# Patient Record
Sex: Female | Born: 1965 | Race: White | Hispanic: No | Smoking: Never smoker
Health system: Southern US, Community
[De-identification: ages and names within clinical notes are randomized; demographics above are authoritative.]

## PROBLEM LIST (undated history)

## (undated) DIAGNOSIS — R519 Headache, unspecified: Secondary | ICD-10-CM

## (undated) DIAGNOSIS — E559 Vitamin D deficiency, unspecified: Secondary | ICD-10-CM

## (undated) DIAGNOSIS — T7840XA Allergy, unspecified, initial encounter: Secondary | ICD-10-CM

## (undated) DIAGNOSIS — E7800 Pure hypercholesterolemia, unspecified: Secondary | ICD-10-CM

## (undated) DIAGNOSIS — F329 Major depressive disorder, single episode, unspecified: Secondary | ICD-10-CM

## (undated) DIAGNOSIS — F32A Depression, unspecified: Secondary | ICD-10-CM

## (undated) DIAGNOSIS — I10 Essential (primary) hypertension: Secondary | ICD-10-CM

## (undated) DIAGNOSIS — F1020 Alcohol dependence, uncomplicated: Secondary | ICD-10-CM

## (undated) DIAGNOSIS — R51 Headache: Secondary | ICD-10-CM

## (undated) HISTORY — DX: Allergy, unspecified, initial encounter: T78.40XA

## (undated) HISTORY — PX: WISDOM TOOTH EXTRACTION: SHX21

## (undated) HISTORY — DX: Depression, unspecified: F32.A

## (undated) HISTORY — DX: Vitamin D deficiency, unspecified: E55.9

## (undated) HISTORY — DX: Alcohol dependence, uncomplicated: F10.20

## (undated) HISTORY — DX: Headache: R51

## (undated) HISTORY — DX: Pure hypercholesterolemia, unspecified: E78.00

## (undated) HISTORY — DX: Headache, unspecified: R51.9

## (undated) HISTORY — DX: Major depressive disorder, single episode, unspecified: F32.9

## (undated) HISTORY — DX: Essential (primary) hypertension: I10

---

## 1998-03-19 ENCOUNTER — Observation Stay (HOSPITAL_COMMUNITY): Admission: AD | Admit: 1998-03-19 | Discharge: 1998-03-20 | Payer: Self-pay | Admitting: Obstetrics and Gynecology

## 1998-03-21 ENCOUNTER — Inpatient Hospital Stay (HOSPITAL_COMMUNITY): Admission: AD | Admit: 1998-03-21 | Discharge: 1998-03-21 | Payer: Self-pay | Admitting: Obstetrics and Gynecology

## 1998-03-21 ENCOUNTER — Inpatient Hospital Stay (HOSPITAL_COMMUNITY): Admission: AD | Admit: 1998-03-21 | Discharge: 1998-03-25 | Payer: Self-pay | Admitting: Gynecology

## 1999-07-25 ENCOUNTER — Other Ambulatory Visit: Admission: RE | Admit: 1999-07-25 | Discharge: 1999-07-25 | Payer: Self-pay | Admitting: Gynecology

## 2000-08-13 ENCOUNTER — Other Ambulatory Visit: Admission: RE | Admit: 2000-08-13 | Discharge: 2000-08-13 | Payer: Self-pay | Admitting: Gynecology

## 2000-12-18 ENCOUNTER — Other Ambulatory Visit: Admission: RE | Admit: 2000-12-18 | Discharge: 2000-12-18 | Payer: Self-pay | Admitting: Gynecology

## 2001-01-08 ENCOUNTER — Observation Stay (HOSPITAL_COMMUNITY): Admission: AD | Admit: 2001-01-08 | Discharge: 2001-01-08 | Payer: Self-pay | Admitting: Gynecology

## 2001-07-11 ENCOUNTER — Inpatient Hospital Stay (HOSPITAL_COMMUNITY): Admission: AD | Admit: 2001-07-11 | Discharge: 2001-07-14 | Payer: Self-pay | Admitting: Gynecology

## 2001-08-26 ENCOUNTER — Other Ambulatory Visit: Admission: RE | Admit: 2001-08-26 | Discharge: 2001-08-26 | Payer: Self-pay | Admitting: Gynecology

## 2001-10-07 ENCOUNTER — Ambulatory Visit (HOSPITAL_COMMUNITY): Admission: RE | Admit: 2001-10-07 | Discharge: 2001-10-07 | Payer: Self-pay | Admitting: Gynecology

## 2001-10-07 ENCOUNTER — Encounter (INDEPENDENT_AMBULATORY_CARE_PROVIDER_SITE_OTHER): Payer: Self-pay | Admitting: *Deleted

## 2002-12-29 ENCOUNTER — Other Ambulatory Visit: Admission: RE | Admit: 2002-12-29 | Discharge: 2002-12-29 | Payer: Self-pay | Admitting: Gynecology

## 2004-08-04 ENCOUNTER — Other Ambulatory Visit: Admission: RE | Admit: 2004-08-04 | Discharge: 2004-08-04 | Payer: Self-pay | Admitting: Gynecology

## 2007-09-03 ENCOUNTER — Ambulatory Visit: Payer: Self-pay

## 2010-09-15 NOTE — Op Note (Signed)
[facility]  PatientPAYZLEE, Melinda Visit Number: [id number] MRN: [id number]          Service Type: OBS Location: 9300 [id number] Attending Physician:  Merrily Pew Dictated by:   Nadyne Coombes Fontaine, M.D. Proc. Date: 07/11/01 Admit Date:  07/11/2001                             Operative Report  PREOPERATIVE DIAGNOSES:       1. Pregnancy at term.                               2. History of prior cesarean section, desires                                  repeat cesarean section.  POSTOPERATIVE DIAGNOSES:      1. Pregnancy at term.                               2. History of prior cesarean section, desires                                  repeat cesarean section.  PROCEDURE:                    Repeat low transverse cervical cesarean section.  SURGEON:                      Timothy P. Fontaine, M.D.  ASSISTANT:                    Katy Fitch, M.D.  ANESTHESIA:                   Spinal.  ESTIMATED BLOOD LOSS:         Les than 500 cc.  COMPLICATIONS:                None.  SPECIMENS:                    Samples of cord blood.  FINDINGS:                     At 7:52 a.m., normal female.  Weight 8 lb 13 oz. Apgars 9 and 9.  Pelvic anatomy noted to be normal.  DESCRIPTION OF PROCEDURE:     The patient was taken to the operating room and underwent spinal anesthesia.  She was placed in the left tile supine position and received and abdominal preparation with Betadine scrub and Betadine solution.  Foley catheter was placed with sterile technique.  The patient was draped in the usual fashion.  After assuring adequate anesthesia, the abdomen was sharply entered through a repeat Pfannenstiel incision, achieving adequate hemostasis at all levels.  A bladder flap was sharply and bluntly developed without difficulty.  The uterus was sharply entered in the lower uterine segment and bluntly extended laterally.  The bulging membranes were  ruptured. The fluid was noted to be clear.  The infants head was delivered through the incision.  The nares and mouth were suctioned.  The rest of the infant was delivered.  The cord was doubly clamped and cut.  The infant was handed to pediatrics in attendance.  Samples of cord blood were obtained.  The placenta was then spontaneously extruded and noted to be intact.  The uterus was exteriorized.  The endometrial cavity was explored with a sponge to remove all placental membrane fragments.  The patient received 1 g of Cefotan IV prophylaxis.  The uterine incision was then closed in one layer using 0 Vicryl suture in a running interlocking stitch.  A single interrupted figure-of-eight suture was then placed at the incision line to achieve ultimate hemostasis. The uterus was returned to the abdomen, which was copiously irrigated. Adequate hemostasis was visualized.  The anterior fascia was then reapproximated using 0 Vicryl suture in a running stitch starting at the angle and meeting in the middle.  The subcutaneous tissues were irrigated.  Adequate hemostasis was visualized.  The skin was reapproximated with staples.  A sterile dressing was applied.  The patient was taken to the recovery room in good condition, having tolerated the procedure well. Dictated by:   Nadyne Coombes. Fontaine, M.D. Attending Physician:  Merrily Pew DD:  07/11/01 TD:  07/12/01 Job: [id number]

## 2010-09-15 NOTE — H&P (Signed)
[facility]  PatientCARMESHA, [name] Visit Number: [id number] MRN: [id number]          Service Type: OBS Location: 910B 9198 01 Attending Physician:  Merrily Pew Dictated by:   Nadyne Coombes Fontaine, M.D. Admit Date:  07/11/2001                           History and Physical  CHIEF COMPLAINT:              Pregnancy at term.  History of prior cesarean section; desires repeat cesarean section.  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 Hyperthyroid, on no medications.  HISTORY OF PRESENT ILLNESS:   A 45 year old G2 P1 female at [redacted] weeks gestation with history of prior cesarean section due to failure to progress, for repeat cesarean section after counseling for trial of labor.  Patients prenatal course was complicated by history of hyperthyroidism for which she has been followed with serial thyroid functions, although she is on no medications due to being clinically euthyroid.  PAST MEDICAL HISTORY:         Low TSH, normal thyroid functions.  PAST SURGICAL HISTORY:        Prior cesarean section in 1999 for failure to progress.  ALLERGIES:                    None.  REVIEW OF SYSTEMS:            Noncontributory.  SOCIAL HISTORY:               Noncontributory.  ADMISSION PHYSICAL EXAMINATION:  HEENT:                        Normal.  LUNGS:                        Clear.  CARDIAC:                      Regular rate.  No rubs, murmurs, gallops.  ABDOMEN:                      Gravid, consistent with term.  Positive fetal heart tones.  PELVIC:                       Deferred.  ASSESSMENT AND PLAN:          A 45 year old G2 P37 female, term gestation, history of prior cesarean section, who desires repeat cesarean section after counseling for trial of labor.  Risks, benefits, indications, and alternatives for the procedure were reviewed to include the risks of infection, prolonged antibiotics, wound complications, opening and draining of incisions,  closure by secondary intention, excessive bleeding requiring transfusion and the risk of transfusion were discussed.  Risks of inadvertent injury to internal organs including bowel, bladder, ureters, vessels and nerves necessitating major exploratory reparative surgeries and future reparative surgeries including ostomy formation were discussed.  The risks of fetal injury including scalpel as well as birthing, musculoskeletal, neural were all discussed, understood, and accepted.  The patients questions are answered to her satisfaction and she is ready to proceed with surgery. Dictated by:   Nadyne Coombes. Fontaine, M.D. Attending Physician:  Merrily Pew DD:  07/10/01 TD:  07/11/01 Job: [id number]

## 2010-09-15 NOTE — Op Note (Signed)
[facility]  PatientAMI, [name] Visit Number: [id number] MRN: [id number]          Service Type: DSU Location: [facility] Attending Physician:  Merrily Pew Dictated by:   Nadyne Coombes Fontaine, M.D. Proc. Date: 10/07/01 Admit Date:  10/07/2001 Discharge Date: 10/07/2001                             Operative Report  PREOPERATIVE DIAGNOSIS:       Wound separation with subsequent refractile granulation tissue.  POSTOPERATIVE DIAGNOSIS:      Wound separation with subsequent refractile granulation tissue.  OPERATION:                    Excision granulation tissue.  SURGEON:                      Timothy P. Fontaine, M.D.  ANESTHESIA:                   Marcaine local injection, IV sedation.  COMPLICATIONS:                None.  ESTIMATED BLOOD LOSS:         Minimal.  SPECIMEN:                     Granulation tissue.  DESCRIPTION OF PROCEDURE:     The patient was taken to the operating room, placed in supine position, underwent IV sedation, received abdominal preparation with Betadine solution.  The patient was draped in the usual fashion.  The skin surrounding the area of granulation tissue was injected subcutaneously with 0.25% Marcaine, and subsequently the area of granulation tissue was excised in an elliptical type incision to the level of the subcutaneous tissues.  Adequate hemostasis was achieved with electrocautery. The tissues were irrigated, and subsequently the skin was reapproximated using 4-0 Vicryl in a running subcuticular stitch.  Steri-Strips were applied to reinforce the incision, a sterile dressing applied.  The patient was taken to the recovery room in good condition having tolerated the procedure well. Dictated by:   Nadyne Coombes. Fontaine, M.D. Attending Physician:  Merrily Pew DD:  10/07/01 TD:  10/09/01 Job: [id number]

## 2010-09-15 NOTE — H&P (Signed)
[facility]  PatientSHERRINE, [name] Visit Number: [id number] MRN: [id number]          Service Type: DSU Location: [facility] Attending Physician:  Merrily Pew Dictated by:   Nadyne Coombes Fontaine, M.D. Admit Date:  10/07/2001                           History and Physical  CHIEF COMPLAINT:              Granulation tissue.  HISTORY OF PRESENT ILLNESS:   A 45 year old status post repeat cesarean section on July 11, 2001.  Postoperative course had been complicated by a superficial incision opening which has granulated in, although has maintained a shelf of granulation tissue which has been recalcitrant to silver nitrate application and has persisted.  Options of continued silver nitrate applications versus excising this small area with reapproximation of the skin were discussed and the patient desires having this area excised.  The patient is admitted at this time for excision under local anesthesia with IV sedation.  PAST MEDICAL HISTORY:         Low TSH with normal thyroid functions.  PAST SURGICAL HISTORY:        Cesarean section x2.  ALLERGIES:                    None.  REVIEW OF SYSTEMS:            Noncontributory.  SOCIAL HISTORY:               Noncontributory.  FAMILY HISTORY:               Noncontributory.  ADMISSION EXAMINATION:  VITAL SIGNS:                  Afebrile, vital signs are stable.  HEENT:                        Normal.  LUNGS:                        Clear.  CARDIAC:                      Regular rate without rubs, murmurs, or gallops.  ABDOMEN:                      Shows an area of approximately 2 cm at the right aspect of her incision with an area of persistent granulation tissue.  No evidence of infection, hernia, or other abnormalities.  ASSESSMENT:                   A 45 year old G2 P2 female status post cesarean section, subsequent incision opening with healing by secondary intention, now with persistent  area of granulation tissue recalcitrant to serial silver nitrate applications.  The patient is admitted for excision and reapproximation of the skin under local anesthesia.  The procedure was discussed with the patient and the risks were reviewed with her to include the primary risks of skin infection; as well as having to reopen this new incision due to either a complication such as hematoma, seroma, infection, abscess formation; or that this could secondarily open on its own as what happened with her prior incision, and that she would have to have this closed by secondary intention as did  occur with the first closure.  Do not anticipate intra-abdominal entry.  I also reviewed the risks of bleeding with her although, given the superficial nature of this, I mentioned the risks of transfusion although realistically this would be extremely low.  The patients questions were answered to her satisfaction and she is ready to proceed with the surgery. Dictated by:   Nadyne Coombes. Fontaine, M.D. Attending Physician:  Merrily Pew DD:  10/06/01 TD:  10/07/01 Job: [id number]

## 2010-09-15 NOTE — Discharge Summary (Signed)
[facility]  PatientPERMELIA, BAMBA Visit Number: [id number] MRN: [id number]          Service Type: OBS Location: 9300 [id number] Attending Physician:  Merrily Pew Dictated by:   Antony Contras, [facility] Admit Date:  07/11/2001 Discharge Date: 07/14/2001                             Discharge Summary  DISCHARGE DIAGNOSES:          1. Pregnancy at term.                               2. History of previous cesarean section, desires                                  repeat.  PROCEDURE:                    Repeat low cervical transverse cesarean section.  HISTORY OF PRESENT ILLNESS:   The patient is a 45 year old, Gravida 2, Para 1-0-0-1 with LMP of October 04, 2000, [facility] July 17, 2001.  Prenatal risk factors include history of previous cesarean section, hyperthyroidism, also this is a Clomid pregnancy.  PRENATAL LABS:              Blood type A positive.  Antibody screen negative. RPR, HBIG, and HIV nonreactive. Rubella immune.  HOSPITAL COURSE AND TREATMENT:                    The patient was admitted on July 11, 2001. Repeat low transverse cervical cesarean section was performed by Dr. Audie Box assisted by Dr. Penni Homans under spinal anesthesia.  Findings included viable female infant, weight 8 pounds 13 ounces, Apgars 9 and 9, normal pelvic anatomy. The patient remained afebrile.  She had no difficulty voiding.  She was able to be discharged in satisfactory condition on her third postoperative day.  LABORATORY DATA:              Hemoglobin 10.  DISPOSITION:                  Follow up in six weeks.  Continue prenatal vitamins and iron.  Motrin and Tylox for pain. Dictated by:   Antony Contras, [facility] Attending Physician:  Merrily Pew DD:  07/30/01 TD:  07/31/01 Job: [id number]

## 2013-01-13 ENCOUNTER — Telehealth: Payer: Self-pay | Admitting: Internal Medicine

## 2013-01-13 NOTE — Telephone Encounter (Signed)
Pt has to cancel upcoming appt for sept 19, unable to find new patient slot on your sch before 12/14. Pt needs cpx before 02/27/13.Will you be willing to work her in?

## 2013-01-14 ENCOUNTER — Ambulatory Visit: Payer: Self-pay | Admitting: Internal Medicine

## 2013-01-14 NOTE — Telephone Encounter (Signed)
Yes put her on a day that I only have 2 other new pts please

## 2013-01-15 NOTE — Telephone Encounter (Signed)
Left message for pt to call back and schedule

## 2013-01-16 ENCOUNTER — Ambulatory Visit: Payer: Self-pay | Admitting: Internal Medicine

## 2013-02-06 ENCOUNTER — Encounter: Payer: Self-pay | Admitting: Internal Medicine

## 2013-02-06 ENCOUNTER — Ambulatory Visit (INDEPENDENT_AMBULATORY_CARE_PROVIDER_SITE_OTHER): Payer: BC Managed Care – PPO | Admitting: Internal Medicine

## 2013-02-06 ENCOUNTER — Other Ambulatory Visit (INDEPENDENT_AMBULATORY_CARE_PROVIDER_SITE_OTHER): Payer: BC Managed Care – PPO

## 2013-02-06 VITALS — BP 142/90 | HR 95 | Temp 97.4°F | Ht 62.0 in | Wt 180.0 lb

## 2013-02-06 DIAGNOSIS — Z23 Encounter for immunization: Secondary | ICD-10-CM

## 2013-02-06 DIAGNOSIS — Z0000 Encounter for general adult medical examination without abnormal findings: Secondary | ICD-10-CM

## 2013-02-06 DIAGNOSIS — Z1329 Encounter for screening for other suspected endocrine disorder: Secondary | ICD-10-CM

## 2013-02-06 DIAGNOSIS — Z1239 Encounter for other screening for malignant neoplasm of breast: Secondary | ICD-10-CM

## 2013-02-06 DIAGNOSIS — Z13220 Encounter for screening for lipoid disorders: Secondary | ICD-10-CM

## 2013-02-06 DIAGNOSIS — Z131 Encounter for screening for diabetes mellitus: Secondary | ICD-10-CM

## 2013-02-06 DIAGNOSIS — Z130 Encounter for screening for diseases of the blood and blood-forming organs and certain disorders involving the immune mechanism: Secondary | ICD-10-CM

## 2013-02-06 DIAGNOSIS — Z733 Stress, not elsewhere classified: Secondary | ICD-10-CM

## 2013-02-06 DIAGNOSIS — F411 Generalized anxiety disorder: Secondary | ICD-10-CM

## 2013-02-06 DIAGNOSIS — F439 Reaction to severe stress, unspecified: Secondary | ICD-10-CM

## 2013-02-06 LAB — CBC
Hemoglobin: 14.6 g/dL (ref 12.0–15.0)
MCHC: 34.1 g/dL (ref 30.0–36.0)
MCV: 97.7 fl (ref 78.0–100.0)
Platelets: 315 10*3/uL (ref 150.0–400.0)
RDW: 12.6 % (ref 11.5–14.6)

## 2013-02-06 LAB — COMPREHENSIVE METABOLIC PANEL
AST: 54 U/L — ABNORMAL HIGH (ref 0–37)
Albumin: 4.3 g/dL (ref 3.5–5.2)
BUN: 12 mg/dL (ref 6–23)
CO2: 27 mEq/L (ref 19–32)
Calcium: 9.5 mg/dL (ref 8.4–10.5)
Chloride: 100 mEq/L (ref 96–112)
Creatinine, Ser: 0.8 mg/dL (ref 0.4–1.2)
GFR: 87.38 mL/min (ref >60.00)
Glucose, Bld: 120 mg/dL — ABNORMAL HIGH (ref 70–99)
Potassium: 5 mEq/L (ref 3.5–5.1)

## 2013-02-06 LAB — TSH: TSH: 1.38 u[IU]/mL (ref 0.35–5.50)

## 2013-02-06 LAB — LIPID PANEL
HDL: 74.7 mg/dL (ref >39.00)
Triglycerides: 73 mg/dL (ref 0.0–149.0)
VLDL: 14.6 mg/dL (ref 0.0–40.0)

## 2013-02-06 MED ORDER — FLUOXETINE HCL 20 MG PO TABS: 20.0000 mg | ORAL_TABLET | Freq: Every day | ORAL | Status: DC

## 2013-02-06 MED ORDER — ALPRAZOLAM 0.5 MG PO TABS: 0.5000 mg | ORAL_TABLET | Freq: Two times a day (BID) | ORAL | Status: DC | PRN

## 2013-02-06 NOTE — Assessment & Plan Note (Signed)
Will start prozac/xanax

## 2013-02-06 NOTE — Addendum Note (Signed)
Addended by: Bethann Punches E on: 02/06/2013 11:10 AM   Modules accepted: Orders

## 2013-02-06 NOTE — Progress Notes (Signed)
HPI  Pt presents to the clinic today to establish care. She does not have a PCP. She does have some concerns today about anxiety and stress. This is starting to affect her everyday life. She used to be on Lexapro at one point but did not feel it was effective so stopped taking it. She would like to get back on something today.  Flu: never Tetanus: not within the last 10 years LMP: 01/03/2013 Pap Smear: More than 2 years ago Mammogram: More than 2 year ago Eye Doctor: as needed Dentist: yearly   Past Medical History  Diagnosis Date  . Depression   . Allergy     No current outpatient prescriptions on file.   No current facility-administered medications for this visit.    Not on File  Family History  Problem Relation Age of Onset  . Cancer Mother 75    breast  . Diabetes Maternal Grandmother   . Heart disease Maternal Grandmother   . Stroke Maternal Grandmother   . Drug abuse Paternal Grandfather   . Early death Neg Hx     History   Social History  . Marital Status: Married    Spouse Name: N/A    Number of Children: N/A  . Years of Education: N/A   Occupational History  . Not on file.   Social History Main Topics  . Smoking status: Never Smoker   . Smokeless tobacco: Not on file  . Alcohol Use: 0.6 oz/week    1 Glasses of wine per week  . Drug Use: No  . Sexual Activity: Yes    Birth Control/ Protection: Surgical   Other Topics Concern  . Not on file   Social History Narrative  . No narrative on file    ROS:  Constitutional: Denies fever, malaise, fatigue, headache or abrupt weight changes.  HEENT: Denies eye pain, eye redness, ear pain, ringing in the ears, wax buildup, runny nose, nasal congestion, bloody nose, or sore throat. Respiratory: Denies difficulty breathing, shortness of breath, cough or sputum production.   Cardiovascular: Denies chest pain, chest tightness, palpitations or swelling in the hands or feet.  Gastrointestinal: Denies abdominal  pain, bloating, constipation, diarrhea or blood in the stool.  GU: Denies frequency, urgency, pain with urination, blood in urine, odor or discharge. Musculoskeletal: Denies decrease in range of motion, difficulty with gait, muscle pain or joint pain and swelling.  Skin: Denies redness, rashes, lesions or ulcercations.  Neurological: Denies dizziness, difficulty with memory, difficulty with speech or problems with balance and coordination.  Psych: Pt reports anxiety and stress. Denies depression, SI/HI.  No other specific complaints in a complete review of systems (except as listed in HPI above).  PE:  BP 142/90  Pulse 95  Temp(Src) 97.4 F (36.3 C) (Oral)  Ht 5\' 2"  (1.575 m)  Wt 180 lb (81.647 kg)  BMI 32.91 kg/m2  SpO2 96% Wt Readings from Last 3 Encounters:  02/06/13 180 lb (81.647 kg)    General: Appears her stated age, obese but well developed, well nourished in NAD. HEENT: Head: normal shape and size; Eyes: sclera white, no icterus, conjunctiva pink, PERRLA and EOMs intact; Ears: Tm's gray and intact, normal light reflex; Nose: mucosa pink and moist, septum midline; Throat/Mouth: Teeth present, mucosa pink and moist, no lesions or ulcerations noted.  Neck: Normal range of motion. Neck supple, trachea midline. No massses, lumps or thyromegaly present.  Cardiovascular: Normal rate and rhythm. S1,S2 noted.  No murmur, rubs or gallops noted. No  JVD or BLE edema. No carotid bruits noted. Pulmonary/Chest: Normal effort and positive vesicular breath sounds. No respiratory distress. No wheezes, rales or ronchi noted.  Abdomen: Soft and nontender. Normal bowel sounds, no bruits noted. No distention or masses noted. Liver, spleen and kidneys non palpable. Musculoskeletal: Normal range of motion. No signs of joint swelling. No difficulty with gait.  Neurological: Alert and oriented. Cranial nerves II-XII intact. Coordination normal. +DTRs bilaterally. Psychiatric: Mood and affect normal.  Behavior is normal. Judgment and thought content normal.      Assessment and Plan:  Preventative Health Maintenance:  Tdap given today Pt will call back to schedule pap/flu shot Encouraged pt to visit eye doctor at least every 2 years Will obtain screening labs today Will set you up for your mammogram  RTC in 1 year or sooner if needed

## 2013-02-06 NOTE — Patient Instructions (Signed)

## 2013-02-20 ENCOUNTER — Ambulatory Visit: Payer: Self-pay | Admitting: Internal Medicine

## 2013-06-19 ENCOUNTER — Other Ambulatory Visit: Payer: Self-pay | Admitting: Internal Medicine

## 2013-06-19 NOTE — Telephone Encounter (Signed)
Last prescribed 01/2013 with 3 refills at [facility]--please advise

## 2013-09-28 ENCOUNTER — Other Ambulatory Visit: Payer: Self-pay | Admitting: Internal Medicine

## 2013-09-29 ENCOUNTER — Encounter: Payer: Self-pay | Admitting: Internal Medicine

## 2013-09-29 ENCOUNTER — Other Ambulatory Visit: Payer: Self-pay

## 2013-09-29 ENCOUNTER — Ambulatory Visit (INDEPENDENT_AMBULATORY_CARE_PROVIDER_SITE_OTHER): Payer: BLUE CROSS/BLUE SHIELD | Admitting: Internal Medicine

## 2013-09-29 VITALS — BP 138/92 | HR 84 | Temp 97.9°F | Wt 177.0 lb

## 2013-09-29 DIAGNOSIS — F3289 Other specified depressive episodes: Secondary | ICD-10-CM | POA: Diagnosis not present

## 2013-09-29 DIAGNOSIS — F411 Generalized anxiety disorder: Secondary | ICD-10-CM

## 2013-09-29 DIAGNOSIS — F439 Reaction to severe stress, unspecified: Secondary | ICD-10-CM

## 2013-09-29 DIAGNOSIS — F32A Depression, unspecified: Secondary | ICD-10-CM

## 2013-09-29 DIAGNOSIS — F329 Major depressive disorder, single episode, unspecified: Secondary | ICD-10-CM | POA: Insufficient documentation

## 2013-09-29 DIAGNOSIS — Z733 Stress, not elsewhere classified: Secondary | ICD-10-CM | POA: Diagnosis not present

## 2013-09-29 MED ORDER — ALPRAZOLAM 0.5 MG PO TABS: 0.5000 mg | ORAL_TABLET | Freq: Two times a day (BID) | ORAL | Status: DC | PRN

## 2013-09-29 MED ORDER — FLUOXETINE HCL 40 MG PO CAPS: 40.0000 mg | ORAL_CAPSULE | Freq: Every day | ORAL | Status: DC

## 2013-09-29 NOTE — Progress Notes (Signed)
Subjective:    Patient ID: [name], female    DOB: [birth year], 48 y.o.   MRN: [id number]  HPI  Pt presents to the clinic today with c/o a medication problem. She is on prozac for anxiety and depression. She does not feel the prozac is working as effectively as it has in the past. She reports worsening depression symptoms. She reports she is sleeping a lot, more irritable and reports that she has been drinking more alcohol recently. She does report stress in her marital relationship. She has not tried marital counseling.  Review of Systems      Past Medical History  Diagnosis Date  . Depression   . Allergy     Current Outpatient Prescriptions  Medication Sig Dispense Refill  . ALPRAZolam (XANAX) 0.5 MG tablet Take 1 tablet (0.5 mg total) by mouth 2 (two) times daily as needed for sleep.  30 tablet  2  . FLUoxetine (PROZAC) 20 MG tablet TAKE 1 TABLET (20 MG TOTAL) BY MOUTH DAILY.  30 tablet  3   No current facility-administered medications for this visit.    No Known Allergies  Family History  Problem Relation Age of Onset  . Cancer Mother 4055    breast  . Diabetes Maternal Grandmother   . Heart disease Maternal Grandmother   . Stroke Maternal Grandmother   . Drug abuse Paternal Grandfather   . Early death Neg Hx     History   Social History  . Marital Status: Married    Spouse Name: N/A    Number of Children: N/A  . Years of Education: N/A   Occupational History  . Not on file.   Social History Main Topics  . Smoking status: Never Smoker   . Smokeless tobacco: Not on file  . Alcohol Use: 0.6 oz/week    1 Glasses of wine per week     Comment: moderate  . Drug Use: No  . Sexual Activity: Yes    Birth Control/ Protection: Surgical   Other Topics Concern  . Not on file   Social History Narrative  . No narrative on file     Constitutional: Denies fever, malaise, fatigue, headache or abrupt weight changes.  Psych: Pt reports anxiety and depression.  Denies SI/HI.  No other specific complaints in a complete review of systems (except as listed in HPI above).  Objective:   Physical Exam   BP 138/92  Pulse 84  Temp(Src) 97.9 F (36.6 C) (Oral)  Wt 177 lb (80.287 kg)  SpO2 99% Wt Readings from Last 3 Encounters:  09/29/13 177 lb (80.287 kg)  02/06/13 180 lb (81.647 kg)    General: Appears her stated age, well developed, well nourished in NAD. Cardiovascular: Normal rate and rhythm. S1,S2 noted.  No murmur, rubs or gallops noted. No JVD or BLE edema. No carotid bruits noted. Pulmonary/Chest: Normal effort and positive vesicular breath sounds. No respiratory distress. No wheezes, rales or ronchi noted.  Psychiatric: Mood terful and affect normal. Behavior is normal. Judgment and thought content normal.     BMET    Component Value Date/Time   NA 137 02/06/2013 1105   K 5.0 02/06/2013 1105   CL 100 02/06/2013 1105   CO2 27 02/06/2013 1105   GLUCOSE 120* 02/06/2013 1105   BUN 12 02/06/2013 1105   CREATININE 0.8 02/06/2013 1105   CALCIUM 9.5 02/06/2013 1105    Lipid Panel     Component Value Date/Time   CHOL 258* 02/06/2013  1105   TRIG 73.0 02/06/2013 1105   HDL 74.70 02/06/2013 1105   CHOLHDL 3 02/06/2013 1105   VLDL 14.6 02/06/2013 1105    CBC    Component Value Date/Time   WBC 9.9 02/06/2013 1105   RBC 4.37 02/06/2013 1105   HGB 14.6 02/06/2013 1105   HCT 42.8 02/06/2013 1105   PLT 315.0 02/06/2013 1105   MCV 97.7 02/06/2013 1105   MCHC 34.1 02/06/2013 1105   RDW 12.6 02/06/2013 1105    Hgb A1C Lab Results  Component Value Date   HGBA1C 5.6 02/06/2013        Assessment & Plan:

## 2013-09-29 NOTE — Progress Notes (Signed)
Pre visit review using our clinic review tool, if applicable. No additional management support is needed unless otherwise documented below in the visit note. 

## 2013-09-29 NOTE — Assessment & Plan Note (Signed)
Rare xanax use.  

## 2013-09-29 NOTE — Patient Instructions (Addendum)

## 2013-09-29 NOTE — Assessment & Plan Note (Signed)
Worse due to marital problems Support offered today Encouraged marital counseling Will increase prozac to 40 mg daily Advised her to cut back on her alcohol use

## 2013-10-29 ENCOUNTER — Other Ambulatory Visit: Payer: Self-pay | Admitting: Internal Medicine

## 2013-10-29 NOTE — Telephone Encounter (Signed)
This medication was last filled 09/29/13 #30 BID PRN--[facility]please advise

## 2013-10-29 NOTE — Telephone Encounter (Signed)
Medication phoned to pharmacy.  

## 2013-10-29 NOTE — Telephone Encounter (Signed)
Please call in.  Thanks.   

## 2013-11-02 ENCOUNTER — Emergency Department (HOSPITAL_COMMUNITY)
Admission: EM | Admit: 2013-11-02 | Discharge: 2013-11-02 | Disposition: A | Discharge status: Home / Self Care | Payer: BLUE CROSS/BLUE SHIELD | Attending: Emergency Medicine | Admitting: Emergency Medicine

## 2013-11-02 ENCOUNTER — Encounter (HOSPITAL_COMMUNITY): Payer: Self-pay | Admitting: Emergency Medicine

## 2013-11-02 DIAGNOSIS — Z3202 Encounter for pregnancy test, result negative: Secondary | ICD-10-CM | POA: Diagnosis not present

## 2013-11-02 DIAGNOSIS — F1010 Alcohol abuse, uncomplicated: Secondary | ICD-10-CM | POA: Diagnosis not present

## 2013-11-02 DIAGNOSIS — H5500 Unspecified nystagmus: Secondary | ICD-10-CM | POA: Insufficient documentation

## 2013-11-02 DIAGNOSIS — F3289 Other specified depressive episodes: Secondary | ICD-10-CM | POA: Insufficient documentation

## 2013-11-02 DIAGNOSIS — Z79899 Other long term (current) drug therapy: Secondary | ICD-10-CM | POA: Diagnosis not present

## 2013-11-02 DIAGNOSIS — R404 Transient alteration of awareness: Secondary | ICD-10-CM | POA: Insufficient documentation

## 2013-11-02 DIAGNOSIS — F329 Major depressive disorder, single episode, unspecified: Secondary | ICD-10-CM | POA: Insufficient documentation

## 2013-11-02 DIAGNOSIS — R4182 Altered mental status, unspecified: Secondary | ICD-10-CM | POA: Diagnosis present

## 2013-11-02 DIAGNOSIS — F10921 Alcohol use, unspecified with intoxication delirium: Secondary | ICD-10-CM

## 2013-11-02 DIAGNOSIS — F411 Generalized anxiety disorder: Secondary | ICD-10-CM | POA: Insufficient documentation

## 2013-11-02 LAB — COMPREHENSIVE METABOLIC PANEL
ALT: 35 U/L (ref 0–35)
AST: 61 U/L — ABNORMAL HIGH (ref 0–37)
Albumin: 3.8 g/dL (ref 3.5–5.2)
Alkaline Phosphatase: 89 U/L (ref 39–117)
Anion gap: 15 (ref 5–15)
BUN: 15 mg/dL (ref 6–23)
CALCIUM: 9.5 mg/dL (ref 8.4–10.5)
CO2: 26 meq/L (ref 19–32)
Chloride: 102 mEq/L (ref 96–112)
Creatinine, Ser: 0.71 mg/dL (ref 0.50–1.10)
GLUCOSE: 86 mg/dL (ref 70–99)
Potassium: 4.5 mEq/L (ref 3.7–5.3)
Sodium: 143 mEq/L (ref 137–147)
Total Bilirubin: 0.2 mg/dL — ABNORMAL LOW (ref 0.3–1.2)
Total Protein: 8.4 g/dL — ABNORMAL HIGH (ref 6.0–8.3)

## 2013-11-02 LAB — CBC
HEMATOCRIT: 40.4 % (ref 36.0–46.0)
HEMOGLOBIN: 13.9 g/dL (ref 12.0–15.0)
MCH: 34.4 pg — AB (ref 26.0–34.0)
MCHC: 34.4 g/dL (ref 30.0–36.0)
MCV: 100 fL (ref 78.0–100.0)
Platelets: 313 10*3/uL (ref 150–400)
RBC: 4.04 MIL/uL (ref 3.87–5.11)
RDW: 12.5 % (ref 11.5–15.5)
WBC: 9.3 10*3/uL (ref 4.0–10.5)

## 2013-11-02 LAB — URINALYSIS, ROUTINE W REFLEX MICROSCOPIC
Bilirubin Urine: NEGATIVE
Glucose, UA: NEGATIVE mg/dL
Hgb urine dipstick: NEGATIVE
KETONES UR: NEGATIVE mg/dL
LEUKOCYTES UA: NEGATIVE
NITRITE: NEGATIVE
PH: 5.5 (ref 5.0–8.0)
Protein, ur: NEGATIVE mg/dL
Specific Gravity, Urine: 1.01 (ref 1.005–1.030)
UROBILINOGEN UA: 0.2 mg/dL (ref 0.0–1.0)

## 2013-11-02 LAB — RAPID URINE DRUG SCREEN, HOSP PERFORMED
AMPHETAMINES: NOT DETECTED
Barbiturates: NOT DETECTED
Benzodiazepines: NOT DETECTED
Cocaine: NOT DETECTED
Opiates: NOT DETECTED
Tetrahydrocannabinol: NOT DETECTED

## 2013-11-02 LAB — ETHANOL: Alcohol, Ethyl (B): 242 mg/dL — ABNORMAL HIGH (ref 0–11)

## 2013-11-02 LAB — ACETAMINOPHEN LEVEL: Acetaminophen (Tylenol), Serum: <15 ug/mL (ref 10–30)

## 2013-11-02 LAB — SALICYLATE LEVEL: Salicylate Lvl: <2 mg/dL — ABNORMAL LOW (ref 2.8–20.0)

## 2013-11-02 LAB — POC URINE PREG, ED: PREG TEST UR: NEGATIVE

## 2013-11-02 NOTE — Discharge Instructions (Signed)
Alcohol Intoxication Alcohol intoxication occurs when you drink enough alcohol that it affects your ability to function. It can be mild or very severe. Drinking a lot of alcohol in a short time is called binge drinking. This can be very harmful. Drinking alcohol can also be more dangerous if you are taking medicines or other drugs. Some of the effects caused by alcohol may include:  Loss of coordination.  Changes in mood and behavior.  Unclear thinking.  Trouble talking (slurred speech).  Throwing up (vomiting).  Confusion.  Slowed breathing.  Twitching and shaking (seizures).  Loss of consciousness. HOME CARE  Do not drive after drinking alcohol.  Drink enough water and fluids to keep your pee (urine) clear or pale yellow. Avoid caffeine.  Only take medicine as told by your doctor. GET HELP IF:  You throw up (vomit) many times.  You do not feel better after a few days.  You frequently have alcohol intoxication. Your doctor can help decide if you should see a substance use treatment counselor. GET HELP RIGHT AWAY IF:  You become shaky when you stop drinking.  You have twitching and shaking.  You throw up blood. It may look bright red or like coffee grounds.  You notice blood in your poop (bowel movements).  You become lightheaded or pass out (faint). MAKE SURE YOU:   Understand these instructions.  Will watch your condition.  Will get help right away if you are not doing well or get worse. Document Released: 10/03/2007 Document Revised: 12/17/2012 Document Reviewed: 09/19/2012 [facility] Patient Information 2015 [place], [place]. This information is not intended to replace advice given to you by your health care provider. Make sure you discuss any questions you have with your health care provider.  Alcohol Problems Most adults who drink alcohol drink in moderation (not a lot) are at low risk for developing problems related to their drinking. However, all drinkers,  including low-risk drinkers, should know about the health risks connected with drinking alcohol. RECOMMENDATIONS FOR LOW-RISK DRINKING  Drink in moderation. Moderate drinking is defined as follows:   Men - no more than 2 drinks per day.  Nonpregnant women - no more than 1 drink per day.  Over age 71 - no more than 1 drink per day. A standard drink is 12 grams of pure alcohol, which is equal to a 12 ounce bottle of beer or wine cooler, a 5 ounce glass of wine, or 1.5 ounces of distilled spirits (such as whiskey, brandy, vodka, or rum).  ABSTAIN FROM (DO NOT DRINK) ALCOHOL:  When pregnant or considering pregnancy.  When taking a medication that interacts with alcohol.  If you are alcohol dependent.  A medical condition that prohibits drinking alcohol (such as ulcer, liver disease, or heart disease). DISCUSS WITH YOUR CAREGIVER:  If you are at risk for coronary heart disease, discuss the potential benefits and risks of alcohol use: Light to moderate drinking is associated with lower rates of coronary heart disease in certain populations (for example, men over age 9 and postmenopausal women). Infrequent or nondrinkers are advised not to begin light to moderate drinking to reduce the risk of coronary heart disease so as to avoid creating an alcohol-related problem. Similar protective effects can likely be gained through proper diet and exercise.  Women and the elderly have smaller amounts of body water than men. As a result women and the elderly achieve a higher blood alcohol concentration after drinking the same amount of alcohol.  Exposing a fetus to  alcohol can cause a broad range of birth defects referred to as Fetal Alcohol Syndrome (FAS) or Alcohol-Related Birth Defects (ARBD). Although FAS/ARBD is connected with excessive alcohol consumption during pregnancy, studies also have reported neurobehavioral problems in infants born to mothers reporting drinking an average of 1 drink per day  during pregnancy.  Heavier drinking (the consumption of more than 4 drinks per occasion by men and more than 3 drinks per occasion by women) impairs learning (cognitive) and psychomotor functions and increases the risk of alcohol-related problems, including accidents and injuries. CAGE QUESTIONS:   Have you ever felt that you should Cut down on your drinking?  Have people Annoyed you by criticizing your drinking?  Have you ever felt bad or Guilty about your drinking?  Have you ever had a drink first thing in the morning to steady your nerves or get rid of a hangover (Eye opener)? If you answered positively to any of these questions: You may be at risk for alcohol-related problems if alcohol consumption is:   Men: Greater than 14 drinks per week or more than 4 drinks per occasion.  Women: Greater than 7 drinks per week or more than 3 drinks per occasion. Do you or your family have a medical history of alcohol-related problems, such as:  Blackouts.  Sexual dysfunction.  Depression.  Trauma.  Liver dysfunction.  Sleep disorders.  Hypertension.  Chronic abdominal pain.  Has your drinking ever caused you problems, such as problems with your family, problems with your work (or school) performance, or accidents/injuries?  Do you have a compulsion to drink or a preoccupation with drinking?  Do you have poor control or are you unable to stop drinking once you have started?  Do you have to drink to avoid withdrawal symptoms?  Do you have problems with withdrawal such as tremors, nausea, sweats, or mood disturbances?  Does it take more alcohol than in the past to get you high?  Do you feel a strong urge to drink?  Do you change your plans so that you can have a drink?  Do you ever drink in the morning to relieve the shakes or a hangover? If you have answered a number of the previous questions positively, it may be time for you to talk to your caregivers, family, and friends  and see if they think you have a problem. Alcoholism is a chemical dependency that keeps getting worse and will eventually destroy your health and relationships. Many alcoholics end up dead, impoverished, or in prison. This is often the end result of all chemical dependency.  Do not be discouraged if you are not ready to take action immediately.  Decisions to change behavior often involve up and down desires to change and feeling like you cannot decide.  Try to think more seriously about your drinking behavior.  Think of the reasons to quit. WHERE TO GO FOR ADDITIONAL INFORMATION   The National Institute on Alcohol Abuse and Alcoholism (NIAAA) BasicStudents.dk  [employer] on Alcoholism and Drug Dependence (NCADD) www.ncadd.org  American Society of Addiction Medicine (ASAM) RoyalDiary.gl  Document Released: 04/16/2005 Document Revised: 07/09/2011 Document Reviewed: 12/03/2007 [facility] Patient Information 2015 [place], [place]. This information is not intended to replace advice given to you by your health care provider. Make sure you discuss any questions you have with your health care provider.  Alcohol Use Disorder Alcohol use disorder is a mental disorder. It is not a one-time incident of heavy drinking. Alcohol use disorder is the excessive and uncontrollable  use of alcohol over time that leads to problems with functioning in one or more areas of daily living. People with this disorder risk harming themselves and others when they drink to excess. Alcohol use disorder also can cause other mental disorders, such as mood and anxiety disorders, and serious physical problems. People with alcohol use disorder often misuse other drugs.  Alcohol use disorder is common and widespread. Some people with this disorder drink alcohol to cope with or escape from negative life events. Others drink to relieve chronic pain or symptoms of mental illness. People with a family history of alcohol use  disorder are at higher risk of losing control and using alcohol to excess.  SYMPTOMS  Signs and symptoms of alcohol use disorder may include the following:   Consumption ofalcohol inlarger amounts or over a longer period of time than intended.  Multiple unsuccessful attempts to cutdown or control alcohol use.   A great deal of time spent obtaining alcohol, using alcohol, or recovering from the effects of alcohol (hangover).  A strong desire or urge to use alcohol (cravings).   Continued use of alcohol despite problems at work, school, or home because of alcohol use.   Continued use of alcohol despite problems in relationships because of alcohol use.  Continued use of alcohol in situations when it is physically hazardous, such as driving a car.  Continued use of alcohol despite awareness of a physical or psychological problem that is likely related to alcohol use. Physical problems related to alcohol use can involve the brain, heart, liver, stomach, and intestines. Psychological problems related to alcohol use include intoxication, depression, anxiety, psychosis, delirium, and dementia.   The need for increased amounts of alcohol to achieve the same desired effect, or a decreased effect from the consumption of the same amount of alcohol (tolerance).  Withdrawal symptoms upon reducing or stopping alcohol use, or alcohol use to reduce or avoid withdrawal symptoms. Withdrawal symptoms include:  Racing heart.  Hand tremor.  Difficulty sleeping.  Nausea.  Vomiting.  Hallucinations.  Restlessness.  Seizures. DIAGNOSIS Alcohol use disorder is diagnosed through an assessment by your caregiver. Your caregiver may start by asking three or four questions to screen for excessive or problematic alcohol use. To confirm a diagnosis of alcohol use disorder, at least two symptoms (see SYMPTOMS) must be present within a 52-month period. The severity of alcohol use disorder depends on the  number of symptoms:  Mild--two or three.  Moderate--four or five.  Severe--six or more. Your caregiver may perform a physical exam or use results from lab tests to see if you have physical problems resulting from alcohol use. Your caregiver may refer you to a mental health professional for evaluation. TREATMENT  Some people with alcohol use disorder are able to reduce their alcohol use to low-risk levels. Some people with alcohol use disorder need to quit drinking alcohol. When necessary, mental health professionals with specialized training in substance use treatment can help. Your caregiver can help you decide how severe your alcohol use disorder is and what type of treatment you need. The following forms of treatment are available:   Detoxification. Detoxification involves the use of prescription medication to prevent alcohol withdrawal symptoms in the first week after quitting. This is important for people with a history of symptoms of withdrawal and for heavy drinkers who are likely to have withdrawal symptoms. Alcohol withdrawal can be dangerous and, in severe cases, cause death. Detoxification is usually provided in a hospital or in-patient substance  use treatment facility.  Counseling or talk therapy. Talk therapy is provided by substance use treatment counselors. It addresses the reasons people use alcohol and ways to keep them from drinking again. The goals of talk therapy are to help people with alcohol use disorder find healthy activities and ways to cope with life stress, to identify and avoid triggers for alcohol use, and to handle cravings, which can cause relapse.  Medication.Different medications can help treat alcohol use disorder through the following actions:  Decrease alcohol cravings.  Decrease the positive reward response felt from alcohol use.  Produce an uncomfortable physical reaction when alcohol is used (aversion therapy).  Support groups. Support groups are run by  people who have quit drinking. They provide emotional support, advice, and guidance. These forms of treatment are often combined. Some people with alcohol use disorder benefit from intensive combination treatment provided by specialized substance use treatment centers. Both inpatient and outpatient treatment programs are available. Document Released: 05/24/2004 Document Revised: 12/17/2012 Document Reviewed: 07/24/2012 [facility] Patient Information 2015 [place], [place]. This information is not intended to replace advice given to you by your health care provider. Make sure you discuss any questions you have with your health care provider.  Alcohol Withdrawal Anytime drug use is interfering with normal living activities it has become abuse. This includes problems with family and friends. Psychological dependence has developed when your mind tells you that the drug is needed. This is usually followed by physical dependence when a continuing increase of drugs are required to get the same feeling or "high." This is known as addiction or chemical dependency. A person's risk is much higher if there is a history of chemical dependency in the family. Mild Withdrawal Following Stopping Alcohol, When Addiction or Chemical Dependency Has Developed When a person has developed tolerance to alcohol, any sudden stopping of alcohol can cause uncomfortable physical symptoms. Most of the time these are mild and consist of tremors in the hands and increases in heart rate, breathing, and temperature. Sometimes these symptoms are associated with anxiety, panic attacks, and bad dreams. There may also be stomach upset. Normal sleep patterns are often interrupted with periods of inability to sleep (insomnia). This may last for 6 months. Because of this discomfort, many people choose to continue drinking to get rid of this discomfort and to try to feel normal. Severe Withdrawal with Decreased or No Alcohol Intake, When Addiction or  Chemical Dependency Has Developed About five percent of alcoholics will develop signs of severe withdrawal when they stop using alcohol. One sign of this is development of generalized seizures (convulsions). Other signs of this are severe agitation and confusion. This may be associated with believing in things which are not real or seeing things which are not really there (delusions and hallucinations). Vitamin deficiencies are usually present if alcohol intake has been long-term. Treatment for this most often requires hospitalization and close observation. Addiction can only be helped by stopping use of all chemicals. This is hard but may save your life. With continual alcohol use, possible outcomes are usually loss of self respect and esteem, violence, and death. Addiction cannot be cured but it can be stopped. This often requires outside help and the care of professionals. Treatment centers are listed in the yellow pages under Cocaine, Narcotics, and Alcoholics Anonymous. Most hospitals and clinics can refer you to a specialized care center. It is not necessary for you to go through the uncomfortable symptoms of withdrawal. Your caregiver can provide you with medicines  that will help you through this difficult period. Try to avoid situations, friends, or drugs that made it possible for you to keep using alcohol in the past. Learn how to say no. It takes a long period of time to overcome addictions to all drugs, including alcohol. There may be many times when you feel as though you want a drink. After getting rid of the physical addiction and withdrawal, you will have a lessening of the craving which tells you that you need alcohol to feel normal. Call your caregiver if more support is needed. Learn who to talk to in your family and among your friends so that during these periods you can receive outside help. Alcoholics Anonymous (AA) has helped many people over the years. To get further help, contact AA or  call your caregiver, counselor, or clergyperson. Al-Anon and Alateen are support groups for friends and family members of an alcoholic. The people who love and care for an alcoholic often need help, too. For information about these organizations, check your phone directory or call a local alcoholism treatment center.  SEEK IMMEDIATE MEDICAL CARE IF:   You have a seizure.  You have a fever.  You experience uncontrolled vomiting or you vomit up blood. This may be bright red or look like black coffee grounds.  You have blood in the stool. This may be bright red or appear as a black, tarry, bad-smelling stool.  You become lightheaded or faint. Do not drive if you feel this way. Have someone else drive you or call 295 for help.  You become more agitated or confused.  You develop uncontrolled anxiety.  You begin to see things that are not really there (hallucinate). Your caregiver has determined that you completely understand your medical condition, and that your mental state is back to normal. You understand that you have been treated for alcohol withdrawal, have agreed not to drink any alcohol for a minimum of 1 day, will not operate a car or other machinery for 24 hours, and have had an opportunity to ask any questions about your condition. Document Released: 01/24/2005 Document Revised: 07/09/2011 Document Reviewed: 12/03/2007 [facility] Patient Information 2015 [place], [place]. This information is not intended to replace advice given to you by your health care provider. Make sure you discuss any questions you have with your health care provider.       You have signs of possible anxiety and/or depression. This is a very common problem.  Be sure to call your caregiver and arrange for follow-up care as suggested by our staff. RETURN IMMEDIATELY IF DEVELOP threat to harm self or others, suicidal or homicidal thoughts, hallucinations or confusion, unable to be cared for at home or uncontrolled  behavior, or other concerns.  Behavioral Health Resources in the [facility]  Intensive Outpatient Programs: [facility]      [address] [place], [place] [phone] Both a day and evening program       [facility] Outpatient     [address]        [place], [place] [address] [phone]         [facility]: [facility] [address] [place] [phone]  [facility] Mental Health ACCESS LINE: [phone] or [phone] [address] [place], [place] [address] [email or web address]  Mobile Crisis Teams:  [facility]         Mobile Crisis Care Unit [phone]             [facility] [address] [place] [phone]                                         [facility] [address] [place] [phone]  Self-Help/Support Groups: Mental Health Assoc. of [employer] of support groups [phone] (call for more info)  Narcotics Anonymous (NA) [facility] [address] [place] - 2 meetings at this location  Residential Treatment Programs:  [facility]      [address]        [place]       [phone]         [facility] [address], [place] [address] [place], [place]  [address] [phone]  [facility]  [address] [place], [place] [address] [phone] Admissions: 8am-3pm M-F  [facility]     [address]        [place], [place] [address]       [phone]         [facility] [address] [place], [place] [phone]  [facility] [address] [place], [place] [phone]  [facility] - Intensive Outpatient      [address]     [place], [place]       [phone]         [facility]  ([facility]     [address] [place], [place] [phone] or [phone]  [facility] ([facility])  [address] [place], [place] [phone]  [facility] [address]                                               [address] [place] [phone]  [facility] Resources: [employer] [facility] [phone]               General Therapy                                                Angie Fava, PhD        [address] [place], [place] [address]         [phone]   Insurance  [facility] Behavioral   [address] [place], [place] [address] [phone]  [facility] [address] [place], [place] [address] [phone] Insurance/Medicaid/sponsorship through [facility]                                              [phone]  [place], [place] [phone]    Therapy/tele-psych/case         [phone]          [facility] [address]   [place], [place]  [phone]  Adolescent/group home/case management [phone]                                           Creola CornJulia Brannon PhD       General therapy       Insurance   [phone]         Dr. Lolly MustacheArfeen Insurance [phone]- [phone] M-F  [place] [facility] Medicaid, sponsorship [phone]

## 2013-11-02 NOTE — ED Notes (Signed)
Pt states that she has been taking depression/anxiety meds and has not been acting appropriately. Slurred speech intermittently and sleeping excessively. Husband recently dx with lung CA. Pt has had a problem with alcohol in past. Alert.

## 2013-11-02 NOTE — ED Provider Notes (Signed)
CSN: [id number]     Arrival date & time 11/02/13  1945 History   First MD Initiated Contact with Patient 11/02/13 1956     Chief Complaint  Patient presents with  . Altered Mental Status     (Consider location/radiation/quality/duration/timing/severity/associated sxs/prior Treatment) HPI 48 year old female with history of anxiety depression and alcohol abuse without prior detox for alcohol abuse, patient presents with several weeks of intermittent confusion with intermittent slurred speech and intermittent unsteady gait as if she is drunk but denies drinking alcohol to her family, upon arrival to the emergency department the patient admits she has been drinking alcohol including today and yesterday evening she initially denied that to her family, the patient also told her family that she was allowed to drink alcohol as well as take her Xanax at the same time, patient denies suicidal or homicidal ideation denies hallucinations patient is oriented to person and place but not to time. There's been no known history of any trauma she is no headache chest pain shortness breath cough abdominal pain vomiting diarrhea rashes or other concerns. There is no treatment prior to arrival. The patient is unsure if she wants to get help with alcohol abuse. Past Medical History  Diagnosis Date  . Depression   . Allergy    alcohol abuse Past Surgical History  Procedure Laterality Date  . Cesarean section     Family History  Problem Relation Age of Onset  . Cancer Mother [id number]    breast  . Diabetes Maternal Grandmother   . Heart disease Maternal Grandmother   . Stroke Maternal Grandmother   . Drug abuse Paternal Grandfather   . Early death Neg Hx    History  Substance Use Topics  . Smoking status: Never Smoker   . Smokeless tobacco: Not on file  . Alcohol Use: 0.6 oz/week    1 Glasses of wine per week     Comment: moderate   OB History   Grav Para Term Preterm Abortions TAB SAB Ect Mult Living             Review of Systems  10 Systems reviewed and are negative for acute change except as noted in the HPI.  Allergies  Erythromycin  Home Medications   Prior to Admission medications   Medication Sig Start Date End Date Taking? Authorizing Provider  ALPRAZolam Prudy Feeler(XANAX) 0.5 MG tablet Take 0.5 mg by mouth 2 (two) times daily as needed for anxiety.   Yes Historical Provider, MD  FLUoxetine (PROZAC) 40 MG capsule Take 1 capsule (40 mg total) by mouth daily. 09/29/13  Yes Nicki Reaperegina Baity, NP   BP 127/90  Pulse 72  Temp(Src) 97.9 F (36.6 C) (Oral)  Resp 16  SpO2 97% Physical Exam  Nursing note and vitals reviewed. Constitutional:  Awake, alert, nontoxic appearance with baseline speech for patient.  HENT:  Head: Atraumatic.  Mouth/Throat: No oropharyngeal exudate.  Eyes: EOM are normal. Pupils are equal, round, and reactive to light. Right eye exhibits no discharge. Left eye exhibits no discharge.  Mild horizontal nystagmus present  Neck: Neck supple.  Cardiovascular: Normal rate and regular rhythm.   No murmur heard. Pulmonary/Chest: Effort normal and breath sounds normal. No stridor. No respiratory distress. She has no wheezes. She has no rales. She exhibits no tenderness.  Abdominal: Soft. Bowel sounds are normal. She exhibits no mass. There is no tenderness. There is no rebound.  Musculoskeletal: She exhibits no tenderness.  Baseline ROM, moves extremities with no obvious new focal weakness.  Lymphadenopathy:    She has no cervical adenopathy.  Neurological: She is alert.  Awake, alert, cooperative and aware of situation; oriented to person and place but not time; slightly slurred speech; motor strength 5/5 bilaterally; sensation normal to light touch bilaterally; peripheral visual fields full to confrontation; no facial asymmetry; tongue midline; major cranial nerves appear intact; no pronator drift, normal finger to nose bilaterally, baseline gait without new ataxia.  Skin:  No rash noted.  Psychiatric: She has a normal mood and affect.    ED Course  Procedures (including critical care time) Patient / Family / Caregiver informed of clinical course, understand medical decision-making process, and agree with plan. Pt appears mildly intoxicated, family agrees as does Pt, Pt and family prefer discharge with OutPt f/u rather than detox now. Labs Review Labs Reviewed  CBC - Abnormal; Notable for the following:    MCH 34.4 (*)    All other components within normal limits  COMPREHENSIVE METABOLIC PANEL - Abnormal; Notable for the following:    Total Protein 8.4 (*)    AST 61 (*)    Total Bilirubin 0.2 (*)    All other components within normal limits  ETHANOL - Abnormal; Notable for the following:    Alcohol, Ethyl (B) 242 (*)    All other components within normal limits  SALICYLATE LEVEL - Abnormal; Notable for the following:    Salicylate Lvl <2.0 (*)    All other components within normal limits  ACETAMINOPHEN LEVEL  URINE RAPID DRUG SCREEN (HOSP PERFORMED)  URINALYSIS, ROUTINE W REFLEX MICROSCOPIC  POC URINE PREG, ED    Imaging Review No results found.   EKG Interpretation None      MDM   Final diagnoses:  Alcohol intoxication, with delirium  Alcohol abuse    I doubt any other EMC precluding discharge at this time including, but not necessarily limited to the following:severe intoxication, withdrawal.    Hurman HornJohn M Mitchael Luckey, MD 11/03/13 1409

## 2013-11-10 ENCOUNTER — Encounter (HOSPITAL_COMMUNITY): Payer: Self-pay | Admitting: Psychology

## 2013-11-16 ENCOUNTER — Other Ambulatory Visit (HOSPITAL_COMMUNITY): Payer: BLUE CROSS/BLUE SHIELD | Attending: Psychiatry | Admitting: Psychology

## 2013-11-16 DIAGNOSIS — Z609 Problem related to social environment, unspecified: Secondary | ICD-10-CM | POA: Insufficient documentation

## 2013-11-16 DIAGNOSIS — F10230 Alcohol dependence with withdrawal, uncomplicated: Secondary | ICD-10-CM

## 2013-11-16 DIAGNOSIS — F1020 Alcohol dependence, uncomplicated: Secondary | ICD-10-CM | POA: Insufficient documentation

## 2013-11-16 DIAGNOSIS — F3289 Other specified depressive episodes: Secondary | ICD-10-CM | POA: Insufficient documentation

## 2013-11-16 DIAGNOSIS — F32A Depression, unspecified: Secondary | ICD-10-CM

## 2013-11-16 DIAGNOSIS — G479 Sleep disorder, unspecified: Secondary | ICD-10-CM | POA: Insufficient documentation

## 2013-11-16 DIAGNOSIS — F411 Generalized anxiety disorder: Secondary | ICD-10-CM | POA: Insufficient documentation

## 2013-11-16 DIAGNOSIS — F329 Major depressive disorder, single episode, unspecified: Secondary | ICD-10-CM

## 2013-11-17 ENCOUNTER — Encounter (HOSPITAL_COMMUNITY): Payer: Self-pay | Admitting: Psychology

## 2013-11-17 NOTE — Progress Notes (Signed)
Daily Group Progress Note  Program: CD-IOP   Group Time: 1-2:30 pm  Participation Level: Active  Behavioral Response: Appropriate and Sharing  Type of Therapy: Process Group  Topic: Group Process: the first part of group was spent in process. Members checked in with their sobriety dates. One member introduced himself with a sobriety date of today. After check-in, the relapse was discussed at length. The member noted he had not attended any meetings from Friday to Sunday. He took a pain pill on Sunday. It was emphasized that this fellow had stopped working his program. The relapse had, indeed, occurred over a very clear time frame. Also present were two new group members who introduced themselves to their new group members. During this session, random drug tests were collected from all group members.   Group Time: 2:45- 4pm  Participation Level: Active  Behavioral Response: Sharing  Type of Therapy: Psycho-education Group  Topic: "The Pros & Cons of Active Addiction versus Sobriety". The second half of group was devoted to a psycho-education session. Members were asked to identify the good and bad of active addiction compared to sobriety. Their responses were put on the board. Some answers could be applied to both pro and con categories, i.e., the return of feelings. Despite the challenges and difficulties of remaining drug and alcohol-free in early recovery, there were very few Cons identified under the Sobriety category. Members agreed that the exercise had helped them clarify the importance and benefits of sobriety in their lives. There was good feedback among members, especially one new member who admitted the pros of using far outweigh the pros of halting his use. Despite disagreements, this young man was greeted with respect and encouragement.   Summary: The patient was new to the group and introduced herself briefly during process. She didn't admit to being an alcoholic, but  admitted she was here to get help and possibly figure that out. The patient explained her alcohol use had caused problems in the past, specifically with her husband. She had agreed to address her drinking, but her husband's recent diagnosis of Stage 4 Lung Cancer had overshadowed everything else. The patient reported she is an emotional person to begin with, but noted that since his diagnosis, she has been especially emotional. The patient reported she had attended an AA meeting last night with a friend. It had been a very interesting meeting and she had found it very compelling. It had been a Health and safety inspector and the patient reported that she could relate to much of his story, but not so much with other parts of it. Everyone had been very friendly and supportive. The patient reported she had seen an old high school buddy at the meeting and another person she knew was a heavy drinker. The patient admitted that she had been struggling with cravings, especially over the weekend. Her husband had even commented on how hard it must be for her, but she has lots of support and a huge community of friends and family to support the family as her husband begins radiation and chemotherapy treatment. In the psycho-ed, the patient identified having feelings as the biggest con of sobriety. If there was ever a time she wanted to numb her feelings it was now. I reminded this new group member that we are all here to support each other and a list with group members' phone numbers was passed around to everyone present. She was eccouraged to reach out and use these numbers. We also processed  what she might do around certain old drinking buddies and places - like the bar at the pool - and she was very open about her daily routine. The patient did very well in her first group session and was warmly welcomed by her fellow group members. Her sobriety date is 7/13.   Family Program: Family present? No   Name of family member(s):   UDS  collected: Yes Results: not back from lab yet  AA/NA attended?: [place]  Sponsor?: No, but she is very new to treatment and her first  AA meeting was last night.   Simmie Garin, LCAS

## 2013-11-18 ENCOUNTER — Other Ambulatory Visit (HOSPITAL_COMMUNITY): Payer: BLUE CROSS/BLUE SHIELD | Admitting: Psychology

## 2013-11-18 DIAGNOSIS — F329 Major depressive disorder, single episode, unspecified: Secondary | ICD-10-CM

## 2013-11-18 DIAGNOSIS — F10230 Alcohol dependence with withdrawal, uncomplicated: Secondary | ICD-10-CM

## 2013-11-18 DIAGNOSIS — F32A Depression, unspecified: Secondary | ICD-10-CM

## 2013-11-19 ENCOUNTER — Encounter (HOSPITAL_COMMUNITY): Payer: Self-pay | Admitting: Psychology

## 2013-11-19 NOTE — Progress Notes (Signed)
Daily Group Progress Note  Program: CD-IOP   Group Time: 1-2:30 pm  Participation Level: Active  Behavioral Response: Sharing  Type of Therapy: Psycho-education Group  Topic: Check-In/Guest Speaker: the first part of group was spent in a brief check-in followed by a guest speaker. Members checked-in with their sobriety dates and any issues and concerns they were dealing with. Also participating today was a Agricultural consultant. The guest was a former group member who had recently attained almost 8 months of sobriety. He shared his story, but most importantly, the guest shared what he has done since he got sober, including emphasizing the importance of a daily routine, attending 12-step meetings and just "showing up".  There were good comments and feedback among the group. During this session today, the program director, CK, met with new group members.   Group Time: 2:45- 4pm  Participation Level: Active  Behavioral Response: Appropriate and Sharing  Type of Therapy: Psycho-education Group  Topic: Process/Gratitude: the second half of group was spent in process. Members disclosed problems and issues they have been dealing with and any struggles or obstacles to ongoing sobriety. Also included in this part of group today was a handout on "Gratitude". Members were provided a handout and after taking a few minutes to complete, they shared some of the things they were grateful for. As the session ended, members shared what they intend to do between now and the next group session to support their recovery.   Summary: the patient was attentive in session. She shared in the check-in that she had attended another [place] meeting. She had seen 3 other women she had known from long ago. Everyone was very friendly and supportive to her and she had picked up a starter chip. She reported tha her husband had begun chemo on Monday and today he would get his first dose of radiation. The patient admitted that they  keep hearing more about her husband's diagnosis and it becomes more complicated and discouraging with each bit of information or news they get. In the last 6 weeks he has been diagnosed with Stage 4 Lung Cancer. With more testing, they are able to see it has metastasized into his brain, liver and other parts of his body. He was not feeling very well this morning when she left and was concerned he would get worse as time passes. She reported that they have a huge amount of support and in her gratitude list, she acknowledged the love and support from so many people. The patient reported both she and her husband have been stunned by the outpouring of support shown by so many friends in the community. She admitted she is exhausted and scared and I encouraged her to focus on the recovery statement: "One Day at a Time". She agreed this is what she isdoing. She made good comments and is doing as well as can be expected and doing what we have asked of her. The patient's sobriety date remains 7/13.    Family Program: Family present? No   Name of family member(s):   UDS collected: No Results:   AA/NA attended?: [place]  Sponsor?: No, but tonight she may ask a woman she has met to be her sponsor   Hortense Cantrall, LCAS

## 2013-11-20 ENCOUNTER — Encounter (HOSPITAL_COMMUNITY): Payer: Self-pay

## 2013-11-20 ENCOUNTER — Other Ambulatory Visit (HOSPITAL_COMMUNITY): Payer: BLUE CROSS/BLUE SHIELD | Admitting: Psychology

## 2013-11-20 DIAGNOSIS — G479 Sleep disorder, unspecified: Secondary | ICD-10-CM

## 2013-11-20 DIAGNOSIS — F1994 Other psychoactive substance use, unspecified with psychoactive substance-induced mood disorder: Secondary | ICD-10-CM

## 2013-11-20 DIAGNOSIS — F10230 Alcohol dependence with withdrawal, uncomplicated: Secondary | ICD-10-CM

## 2013-11-20 MED ORDER — TRAZODONE HCL 50 MG PO TABS: 50.0000 mg | ORAL_TABLET | Freq: Every day | ORAL | Status: DC

## 2013-11-20 NOTE — Progress Notes (Signed)
Psychiatric Assessment Adult  Patient Identification:  [name] Date of Evaluation:  11/20/2013 Chief Complaint: " To get well to be there for my family " History of Chief Complaint:  Pt is 48 yo WF Self referred thru husband after he discovered he had Stage IV Non small cell Lung CA 2 weeks ago and who has been concerned about her drinking especially in lihgt of the potentiality she would be responsible for them should he not respond to treatment.Upon learning of her husbamnd's fate pt agreed to quit drinking and attend [facility] IOP the patient had been a "light drinker in college and up until 1 year ago when she began to display signs of inability to control her consumption resulting in a number of "embarassing" incidents involving family;friends and social events.She admits she doesnt remeber some of these incidents.She would stop drinking for a time after such an incudent but would for some unknown reason to her start drinking again.In the past year she was diagnosed with anxiety and prescribed xanax.On July 6th she was brought to ED by family with ataxia and dysarthria denying ETOH use but subsequently was diagnosed as intoxicated from the combination of alcohol and xanax.She was also dx'd as depressed and prescribed Prozac in June. 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  HPILocation : [facility] CD IOP [facility];Quality : intake assessment Severity: Early Stage alcoholism ala [place];Duration: 25 yrs with marked progression in past year Review of Systems  Constitutional: Positive for activity change and appetite change.  Negative for fever, chills, diaphoresis, fatigue and unexpected weight change.       Feels better now 2 weeks off alcohol  HENT: Negative for congestion, dental problem, ear discharge, facial swelling, hearing loss, mouth sores, nosebleeds and postnasal drip.   Eyes: Negative.  Negative for photophobia, pain, discharge, redness, itching and visual disturbance.  Respiratory: Negative.  Negative for apnea, cough, choking, chest tightness, shortness of breath, wheezing and stridor.   Cardiovascular: Negative.  Negative for chest pain, palpitations and leg swelling.  Gastrointestinal: Negative.   Endocrine: Negative.  Negative for cold intolerance, heat intolerance, polydipsia, polyphagia and polyuria.  Musculoskeletal: Negative.   Skin: Negative.   Allergic/Immunologic: Negative.  Negative for environmental allergies, food allergies and immunocompromised state.  Neurological: Positive for headaches (thinks from not sleeping well). Negative for dizziness, seizures, syncope, facial asymmetry, speech difficulty, weakness, light-headedness and numbness.  Hematological: Negative.  Negative for adenopathy. Does not bruise/bleed easily.  Psychiatric/Behavioral: Positive for behavioral problems (Etoh dependence Eraly stage) and sleep disturbance. Negative for confusion and decreased concentration. The patient is nervous/anxious.        Husband diagnosed with Stage IV Non small cell CA Lung and started Rx this week. He has been approved for genetic traetments   Physical Exam  Vitals reviewed. Constitutional: She is oriented to person, place, and time. She appears well-developed and well-nourished. She appears distressed (Husband facing life threatening CA dx'd 2 weeks ago).  HENT:  Head: Normocephalic and atraumatic.  Right Ear: External ear normal.  Left Ear: External ear normal.  Nose: Nose normal.  Eyes: Conjunctivae and EOM are normal. Pupils are equal, round, and reactive to light. Right eye  exhibits no discharge. Left eye exhibits no discharge. No scleral icterus.  Neck: Normal range of motion. Neck supple. No JVD present. No tracheal deviation present.  Cardiovascular: Normal rate and regular rhythm.   Pulmonary/Chest: Effort normal and breath sounds normal. No stridor. No respiratory distress. She has no wheezes. She exhibits no tenderness.  Abdominal: She exhibits no distension.  Genitourinary:  Deferred  Musculoskeletal: Normal range of motion. She exhibits no edema and no tenderness.  Neurological: She is alert and oriented to person, place, and time. No cranial nerve deficit. Coordination normal.  Skin: Skin is warm and dry. No rash noted. She is not diaphoretic. No erythema. No pallor.  Psychiatric:  SEE MSE Below    Depressive Symptoms: fatigue, feelings of worthlessness/guilt, anxiety, disturbed sleep,all of which seemed to dissipate when she found out about her husbands illness.She stopped drinking and has felt energized as well as terrified with a new sense of purpose and value to her family   (Hypo) Manic Symptoms:   Elevated Mood:  NA Irritable Mood:  NA Grandiosity:  NA Distractibility:  NA Labiality of Mood:  NA Delusions:  Negative Hallucinations:  Negative Impulsivity:  Negative Sexually Inappropriate Behavior:  Negative Financial Extravagance:  Negative Flight of Ideas:  Negative  Anxiety Symptoms: Excessive Worry:  Yes Panic Symptoms:  No Agoraphobia:  No Obsessive Compulsive: Negative  Symptoms: None, Specific Phobias:  No Social Anxiety:  No  Psychotic Symptoms:  Hallucinations: NA None Delusions:  Negative Paranoia:  Negative   Ideas of Reference:  Negative  PTSD Symptoms: Ever had a traumatic exposure:  Negative Had a traumatic exposure in the last month:  No Re-experiencing: Negative None Hypervigilance:  Negative Hyperarousal: Negative None Avoidance: Negative None  Traumatic Brain Injury: Negative na  Past Psychiatric  History: Diagnosis: Anxiety by PCP/Depression dx 6/15-failure to diagnose alcohol abuse  Hospitalizations: NA [facility]  [facility]  Substance Abuse Care:as above  Self-Mutilation: NA  Suicidal Attempts: NA  Violent Behaviors: NA   Past Medical History:   Past Medical History  Diagnosis Date  . Depression   . Allergy    History of Loss of Consciousness:  Yes Seizure History:  No Cardiac History:  No Allergies:   Allergies  Allergen Reactions  . Erythromycin     Stomach  cramps   Current Medications:  Current Outpatient Prescriptions  Medication Sig Dispense Refill  . acamprosate (CAMPRAL) 333 MG tablet Take 333 mg by mouth 3 (three) times daily.      Marland Kitchen. ALPRAZolam (XANAX) 0.5 MG tablet Take 0.5 mg by mouth 2 (two) times daily as needed for anxiety.      Marland Kitchen. FLUoxetine (PROZAC) 40 MG capsule Take 1 capsule (40 mg total) by mouth daily.  30 capsule  3   No current facility-administered medications for this visit.    Previous Psychotropic Medications:  Medication Dose   xanax   1 mg  prozac 40 mg                  Substance Abuse History in the last 12 months: Substance Age of 1st Use Last Use Amount Specific Type  Nicotine 0 0 0 0  Alcohol 21 11/08/13 15 oz daily vodka  Cannabis [age] experimented in college pot  Opiates 0 0 0 0  Cocaine 0 0 0 0  Methamphetamines 0  0 0 0  LSD 0 0 0 0  Ecstasy 0 0 0 0  Benzodiazepines 48   xanax  Caffeine Not elicited 0 0 0  Inhalants 0 0 0 0  Others: na na na na                      Medical Consequences of Substance Abuse:NA  Legal Consequences of Substance Abuse: NA  Family Consequences of Substance Abuse: Disapointment/Embarrasing moments  Blackouts:  Yes DT's:  NA Withdrawal Symptoms:  Negative None  Social History: Current Place of Residence: [facility] Place of Birth: [place] Family Members: Husband,2 sons;Parents living/ F 86 COPD M 81 works  gardens Marital Status:  Married Husband Nonsmall Cell LUNG Stage 4 Children: 2  Sons:Chatrlie 15;Sammy 12  Daughters: 0 Relationships: Married/as above Education:  Engineer, building services Problems/Performance: 3.2 Religious Beliefs/Practices: Presbyterian History of Abuse: none Armed forces technical officer;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 Legal History: NA Hobbies/Interests: Cooking/tennis /swim/family and friends  Family History:   Family History  Problem Relation Age of Onset  . Cancer Mother 68    breast  . Diabetes Maternal Grandmother   . Heart disease Maternal Grandmother   . Stroke Maternal Grandmother   . Drug abuse Paternal Grandfather   . Early death Neg Hx   . Alcohol abuse Sister     Mental Status Examination/Evaluation: Objective:  Appearance: Well Groomed  Eye Contact::  Fair tends to act ashamed when incidents in HPI mentioned  Speech:  Clear and Coherent  Volume:  Normal  Mood:  Full range  Affect:  Congruent  Thought Process:  Coherent and Logical  Orientation:  Full (Time, Place, and Person)  Thought Content:  WDL  Suicidal Thoughts:  No  Homicidal Thoughts:  No  Judgement:  Fair  Insight:  Lacking  Psychomotor Activity:  Negative  Akathisia:  NA  Handed:  Right  AIMS (if indicated):  na  Assets:  Communication Skills Desire for Improvement Financial Resources/Insurance Housing Physical Health Social Support Talents/Skills Transportation Vocational/Educational    Laboratory/X-Ray Psychological Evaluation(s)   Physical October 2014 PCP [facility] [place]  [facility] CDIOP 7/15   Assessment:  See below  AXIS I Alcohol dependence syndrome -early stage;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 Depression   . Allergy      AXIS IV problems related to social  environment and problems with primary support group  AXIS V 41-50 serious symptoms   Treatment Plan/Recommendations:  Plan of Care: Admit to [facility]  Laboratory:  Per PCP  Psychotherapy: IOP Group and individual for now-pt has support for current crisis  Medications: See List Rx Trazodone today for sleep  Routine PRN Medications:  Negative  Consultations: None at this time  Safety Concerns:  None at this time  Other:  Pt admits to taking campral prn -denies craving/too soon for PAWS    [facility] 7/24/20152:09 PM

## 2013-11-20 NOTE — Progress Notes (Unsigned)
[name]  Orientation to CD-IOP: The patient is a 48 yo married, Caucasian, female seeking entry into the CD-IOP. She lives in [place] with her husband and two boys, ages 3012 and 7115. The patient has a long history of alcohol use that began at age 48. She reports being a light drinker in her college years at [employer] in [place] and then [employer]. In the last year or so, the patient's drinking has become more problematic and there have been embarrassing scenes with friends, at parties or with her family. The patient's husband first contacted me about his wife's drinking 2 weeks ago. He explained that he had recently been diagnosed with lung cancer and "it didn't look good". He admitted he was worried about his wife's drinking, especially since he recognized he might not be there to help raise his two children. The patient admitted that after something bad has happened due to her drinking, she will stop entirely for maybe two weeks, but something would compel her to start back up. Prior to her meeting with me, the patient reported drinking daily and stated she would have about 3 vodkas. In October of this past year, she had been diagnosed with anxiety and prescribed Xanax by her PCP. The patient was born and raised in [place]. Her paternal grandmother was an alcoholic and she has a twin sister, [name], who had 10 years of sobriety, but has gone back to drinking. The patient reports her 2 older brothers are social drinkers. The patient's husband also grew up in [place] and they have a wide support network and they are going to need it in the days and weeks ahead. The patient was very cooperative during the orientation. She admitted that socializing an alcohol are a big part of her daily life and realizes this is going to be very difficult. The patient works part-time for a [employer] business. The owner is a good friend and very supportive and he has been in recovery for a number of years. He has  offered to accompany her to some AA meetings. The documentation was reviewed, signed and the orientation completed. The patient's husband has an appointment tomorrow with a radiology oncologist, who is also a close family friend. They are scheduled to leave town on Thursday for a previously planned weekend [place]. The patient agreed to return on Monday, July 20th at 1 pm and begin the CD-IOP.        Rishard Delange, LCAS

## 2013-11-23 ENCOUNTER — Encounter (HOSPITAL_COMMUNITY): Payer: Self-pay | Admitting: Psychology

## 2013-11-23 ENCOUNTER — Other Ambulatory Visit (HOSPITAL_COMMUNITY): Payer: BLUE CROSS/BLUE SHIELD | Admitting: Psychology

## 2013-11-23 DIAGNOSIS — F10230 Alcohol dependence with withdrawal, uncomplicated: Secondary | ICD-10-CM

## 2013-11-23 DIAGNOSIS — F329 Major depressive disorder, single episode, unspecified: Secondary | ICD-10-CM

## 2013-11-23 DIAGNOSIS — F32A Depression, unspecified: Secondary | ICD-10-CM

## 2013-11-23 NOTE — Progress Notes (Signed)
    Daily Group Progress Note  Program: CD-IOP   Group Time: 1-2:30 pm  Participation Level: Active  Behavioral Response: Appropriate and Sharing  Type of Therapy: Process Group  Topic: Process: The first part of group was spent in process. Members shared about what they have done since the last group to support their recovery. The group provided feedback and support for the challenges and issues identified by members. During this session, the medical director was meeting with new group members, following up with current members and meeting with a patient discharging from the program today.  Group Time: 2:45- 4pm  Participation Level: Minimal  Behavioral Response: Appropriate  Type of Therapy: Psycho-education Group  Topic: Psycho-Ed/Graduation: the second part of group was spent in a psycho-education session on Heart Math. Members volunteered red to 'hook up' to the computer and trace their progress in this stress reduction practice. The group seemed to enjoy watching each other practice calming themselves. The last 3 members were able to achieve total coherence. As the session neared the end, a graduation ceremony was held honoring a member who was completing the program today. The medallion was passed around and members wished him well. The departing member encouraged his fellow group members to keep focused and assured them he would see them in meetings.   Summary: The patient reported she was really tired today. She admitted she had not slept well for the past two nights and spent the evening on the couch last night. She explained her husband snores and she just can't sleep. When I asked if that had bothered her as much in the past, the patient admitted his snoring had not really bothered her. I wondered if after quite a few drinks she had 'passed out' and managed to sleep there because of the alcohol. The patient agreed that this was probably the case.  The patient reported she had  secured a sponsor and had met with her on Wednesday evening after a meeting. She had also phoned her yesterday. The patient reported she had signed up for '[email or web address] for her husband and this allows friends and loved ones to go online and follow his progress in treatment. It also eliminates the constant phone conversations with people trying to find out the latest about his treatment and how they are all doing. She admitted that he did not feel well yesterday and had a slight fever. He has had 5 chemo treatments as of today and 3 radiation episodes. The patient also reported having had lunch with friends yesterday and it had been nice to be with her good friends. She reminded the group of how much support her family has. She met with the program director during the Heart Math session, but did not want to participate. This patient has done everything we have asked in a short time. There is tremendous strain and stress with her husband having been given a Stage 4 lung cancer diagnosis less than 6 weeks ago and she has accepted that remaining alcohol-free is something that will reduce the stress and angst on everyone in the family, including their two children. The patient's sobriety date remains 7/13.    Family Program: Family present? No   Name of family member(s):   UDS collected: No Results:  AA/NA attended?: YesWednesday  Sponsor?: Yes   Nupur Hohman, LCAS

## 2013-11-24 ENCOUNTER — Encounter (HOSPITAL_COMMUNITY): Payer: Self-pay | Admitting: Psychology

## 2013-11-24 NOTE — Progress Notes (Unsigned)
[name] CD-IOP: Treatment Planning Session. Met with the patient this morning for an individual counseling session and to identify her goals for treatment. The patient reported a good weekend. Her brother and family had visited from [place] and it had been a nice weekend. Her husband had been very tired and spent most of the weekend resting, but after completing 5 consecutive days of radiation as well as oral chemo, this wasn't surprising. I explained the importance of treatment goals and the patient was in agreement that sobriety was the number one goal of her treatment here. She was also in agreement that she can't do this alone. The patient reported she had attended 2 AA meetings this weekend and had spoken to her sponsor daily. She has really gotten engaged very quickly in treatment and done very well. I reminded her that she was free to vent in group and she might need to do that as time passes. The patient reported she has not felt that way yet, but she reminded me that her husband's Stage 4 Lung Cancer treatment has just begun. If there are changes - either progress or bad news - she admitted she will use the group for that if need be. The patent reminded me that everyone has been so supportive since learning about Chuck's illness and they are amazed by the kindness shown to him and the family. The patient had met with the program director on Friday and he had prescribed Trazadone. She reported she slept well over the weekend.  The documentation was reviewed, signed and the treatment plan completed accordingly. We will continue to follow this patient in the days and weeks ahead. Her sobriety date remains 7/13.        Stanley Helmuth, LCAS

## 2013-11-24 NOTE — Progress Notes (Signed)
    Daily Group Progress Note  Program: CD-IOP   Group Time: 1-2:30 pm  Participation Level: Active  Behavioral Response: Appropriate and Sharing  Type of Therapy: Process Group  Topic: Process/Psycho-Ed; the first part of group was spent in process. Members shared about the past weekend and events or situations that had challenged their sobriety. After checking-in, they also discussed the things they did to support their recovery. Halfway through the session, an explanation of the 4 elements that are involved in the development of addiction was provided. These are referred to the Bio-Psycho-Social-Spiritual elements. Only 1 of the 4 elements - the biological portion - cannot be changed. The remainder of the session was spent identifying how to address the other 3 elements in a way that supports and promotes sobriety.  Group Time: 2:45- 4pm  Participation Level: Minimal  Behavioral Response: Sharing  Type of Therapy: Psycho-education Group  Topic: Psycho-Ed: Negative Core Beliefs. The second part of group was spent in a presentation on the development of negative core beliefs. A handout was provided. Members shared how some of their negative core beliefs may have developed. The new group members noted his father was a Programmer, multimediapreacher and a part of him believes it is a choice that he makes to smoke crack. He feels even worse about himself when he thinks that he may just be choosing to smoke versus the lack of control in active addiction. Another member reported she would worry about someone might think negatively of her and not risk certain things as a result of these fears. There was good feedback among members and I reminded them that these beliefs must eventually be identified and addressed because they fuel one's addiction.   Summary: The patient reported she had had a good weekend. Her brother from [place] and his family had come to town and they had spent good time together. Her husband had been  very tired, but didn't have any radiation. She reported that he was feeling better this morning, but would have to go back to the radiation today and he was concerned he would start feeling badly again. It is likely part of the treatment cycle. She reported everyone has been very supportive and kind and she and her family are overwhelmed by the outpouring of support. She attended 2 AA meetings and had spoken with her sponsor. They are to meet tonight. The patient said little in the second half of group and it is unclear if she received a lot of negative messages or interpretations in her childhood. She continues to do well and makes good comments in group. Her sobriety date remains 7/13.   Family Program: Family present? No   Name of family member(s):   UDS collected: No Results:   AA/NA attended?: YesFriday and Sunday  Sponsor?: Yes   Oleva Koo, LCAS

## 2013-11-25 ENCOUNTER — Other Ambulatory Visit (HOSPITAL_COMMUNITY): Payer: BLUE CROSS/BLUE SHIELD

## 2013-11-27 ENCOUNTER — Other Ambulatory Visit (HOSPITAL_COMMUNITY): Payer: BLUE CROSS/BLUE SHIELD | Admitting: Psychology

## 2013-11-27 DIAGNOSIS — F10230 Alcohol dependence with withdrawal, uncomplicated: Secondary | ICD-10-CM

## 2013-11-27 DIAGNOSIS — F329 Major depressive disorder, single episode, unspecified: Secondary | ICD-10-CM

## 2013-11-27 DIAGNOSIS — F32A Depression, unspecified: Secondary | ICD-10-CM

## 2013-11-30 ENCOUNTER — Encounter (HOSPITAL_COMMUNITY): Payer: Self-pay | Admitting: Psychology

## 2013-11-30 ENCOUNTER — Other Ambulatory Visit (HOSPITAL_COMMUNITY): Payer: BLUE CROSS/BLUE SHIELD | Attending: Psychiatry | Admitting: Psychology

## 2013-11-30 DIAGNOSIS — Z609 Problem related to social environment, unspecified: Secondary | ICD-10-CM | POA: Diagnosis not present

## 2013-11-30 DIAGNOSIS — F32A Depression, unspecified: Secondary | ICD-10-CM

## 2013-11-30 DIAGNOSIS — F1020 Alcohol dependence, uncomplicated: Secondary | ICD-10-CM | POA: Insufficient documentation

## 2013-11-30 DIAGNOSIS — F3289 Other specified depressive episodes: Secondary | ICD-10-CM | POA: Diagnosis not present

## 2013-11-30 DIAGNOSIS — F329 Major depressive disorder, single episode, unspecified: Secondary | ICD-10-CM | POA: Insufficient documentation

## 2013-11-30 DIAGNOSIS — F10230 Alcohol dependence with withdrawal, uncomplicated: Secondary | ICD-10-CM

## 2013-11-30 DIAGNOSIS — G479 Sleep disorder, unspecified: Secondary | ICD-10-CM | POA: Diagnosis not present

## 2013-11-30 DIAGNOSIS — F411 Generalized anxiety disorder: Secondary | ICD-10-CM | POA: Insufficient documentation

## 2013-11-30 NOTE — Progress Notes (Signed)
    Daily Group Progress Note  Program: CD-IOP   Group Time: 1-2:30 pm  Participation Level: Active  Behavioral Response: Sharing  Type of Therapy: Process Group  Topic: Process; the first part of group was spent in process. Members shared about current issues and concerns. They also disclosed the things they had done to support their recovery, including meetings and speaking with their sponsor. The importance of attendance and being here for one's self and others was emphasized. This was pointed out in light of a few absences and disappearances by, apparently, "former" group members.   Group Time:2:45- 4pm  Participation Level: Active  Behavioral Response: Sharing  Type of Therapy: Psycho-education Group  Topic: Emotional Buttons: the second half of group was spent in a psycho-ed. A handout was provided identifying different types of "buttons" that people might have. All of the 'buttons" listed deal with negative feelings that are generated when the "button" is pushed. The discussion was lively with good feedback and disclosure among group members.   Summary: The patient reported she had missed the last group session because her husband had been feelings very ill. She had taken him to the hospital in the morning to be hydrated and receive fluids. Then in the afternoon, she had driven him to his radiation appointment. The patient admitted he couldn't have driven himself and she was glad she was there for him, but sorry she missed the group. The patient reported she had gone to [employer] last night with a group of friends. While they drank wine and enjoyed appetizers, she drank water. The patient insisted she had no problem with this and reminded the group, "I can't drink". The patient also shared that her oldest child, the 46 yo son, had asked her if she had "noticed how we all have been getting along much better for the past 3 weeks"? The 3 weeks coincides with her eliminating  alcohol. This proved to be a very powerful testimony. In the psycho-ed, the patient identified the 'inadequacy button' as her biggest. She admitted that she 'doesn't like people to be mad at me'. The patient, like others, begins to feel ashamed and incompetent, terms identified on the handout. The patient provided good feedback to her fellow group members and was very open about her own issues. She responded well to this intervention and her sobriety date remains 7/13.  Family Program: Family present? No   Name of family member(s):   UDS collected: No Results:   AA/NA attended?: YesWednesday  Sponsor?: Yes   Rolf Fells, LCAS

## 2013-12-02 ENCOUNTER — Other Ambulatory Visit (HOSPITAL_COMMUNITY): Payer: BLUE CROSS/BLUE SHIELD | Admitting: Psychology

## 2013-12-02 DIAGNOSIS — F10230 Alcohol dependence with withdrawal, uncomplicated: Secondary | ICD-10-CM

## 2013-12-02 DIAGNOSIS — F329 Major depressive disorder, single episode, unspecified: Secondary | ICD-10-CM

## 2013-12-02 DIAGNOSIS — F32A Depression, unspecified: Secondary | ICD-10-CM

## 2013-12-02 DIAGNOSIS — F1020 Alcohol dependence, uncomplicated: Secondary | ICD-10-CM | POA: Diagnosis not present

## 2013-12-03 ENCOUNTER — Encounter (HOSPITAL_COMMUNITY): Payer: Self-pay | Admitting: Psychology

## 2013-12-03 NOTE — Progress Notes (Signed)
    Daily Group Progress Note  Program: CD-IOP   Group Time: 1-2:30 pm  Participation Level: Active  Behavioral Response: Appropriate and Sharing  Type of Therapy: Process Group  Topic: Process: the first part of group was spent in process. Members shared about what had occurred since we last met. During check-in, a patient identified a new sobriety date. This relapse was explored further with good feedback and discussion among group members. During this half of group, one member admitted another member's comments were very frustrating and really bothering her. I applauded her willingness to be honest - he had made distracting comments and interrupted repeatedly. The group discussed his behaviors, but whether he 'heard' these observations, remains to be seen.   Group Time: 2:45- 4pm  Participation Level: Active  Behavioral Response: Sharing  Type of Therapy: Psycho-education Group  Topic: Healthy Boundaries: the second half of group was spent in a psycho-ed. The topic of the presentation was on establishing healthy boundaries. These are particularly important in early recovery, when whatever boundaries one might have had in sobriety were abandoned with active addiction. There was frank disclosures and group members shared openly about their regrets of the past. At the end of the session, two members were asked to 'teach back'. Both made good comments and displayed a good understanding of the presentation.   Summary: The patient reported she had attended 2 meetings and spoken with her sponsor daily. Her husband is feeling much better now that the radiation has stopped and she is happier to see him more active and engaged. The patient reported she attended 2 AA meetings and spoke with her sponsor every day. The patient admitted she had not slept well last night. She is out of her Trazadone, but I encouraged her to have the pharmacy fax a refill request to our office. The patient explained  that she was physically tired, but her brain was going in a million different directions. She made some good comments in the psycho-ed. The patient pointed out that while she is willing to challenge or disagree with her parents, she has never seen her husband in 49+ years, contradict or question his parents. The patient agreed that he had probably learned this while observing his father during his childhood years - nobody challenged the female patriarch - and he continues to follow those rules. The patient made some helpful observations and her comments displayed good insight into recovery concepts. She admitted that her family and their dynamics are much improved in just the short time since she has stopped drinking. The patient responded well to this intervention and her sobriety date remains 7/13.   Family Program: Family present? No   Name of family member(s):   UDS collected: No Results:  AA/NA attended?: YesMonday and Tuesday  Sponsor?: Yes   Camila Norville, LCAS

## 2013-12-04 ENCOUNTER — Other Ambulatory Visit (HOSPITAL_COMMUNITY): Payer: BLUE CROSS/BLUE SHIELD | Admitting: Psychology

## 2013-12-04 ENCOUNTER — Encounter (HOSPITAL_COMMUNITY): Payer: Self-pay | Admitting: Psychology

## 2013-12-04 DIAGNOSIS — F32A Depression, unspecified: Secondary | ICD-10-CM

## 2013-12-04 DIAGNOSIS — F329 Major depressive disorder, single episode, unspecified: Secondary | ICD-10-CM

## 2013-12-04 DIAGNOSIS — F10230 Alcohol dependence with withdrawal, uncomplicated: Secondary | ICD-10-CM

## 2013-12-04 DIAGNOSIS — F1020 Alcohol dependence, uncomplicated: Secondary | ICD-10-CM | POA: Diagnosis not present

## 2013-12-04 MED ORDER — TRAZODONE HCL 150 MG PO TABS
150.0000 mg | ORAL_TABLET | Freq: Every day | ORAL | Status: DC
Start: 2013-12-04 — End: 2014-01-13

## 2013-12-04 NOTE — Progress Notes (Signed)
    Daily Group Progress Note  Program: CD-IOP   Group Time: 1-2:30 pm  Participation Level: Active  Behavioral Response: Appropriate and Sharing  Type of Therapy: Process Group  Topic: Process/Psycho-Ed: the first half of group was spent in process with members updating the group on any events or issues that have appeared over the weekend. One member shared about the session with his S/O and me earlier this morning. A new member was present and she shared a little about herself. The session continued with a slide show from the [employer] Manual on "Triggers and Cravings". The presentation educated members on how triggers are developed as the addiction strengthens. There was good feedback and discussion during the show.  Group Time: 2:45- 4pm  Participation Level: Active  Behavioral Response: Sharing  Type of Therapy: Psycho-education Group  Topic: Deactivating Cravings: a handout was provided as a follow up to the slide show completed in the first part of group. A scenario about a fellow who had relapsed over the course of the day was read by members. The group was challenged to identify how and where this character had gone wrong. Members displayed good insight and understanding of the development of cravings. The discussion included examples from their own lives and past drug use and relapse.    Summary: The patient reported she had had a good weekend. She had attended 2 AA meetings and is going to make the Monday evening [place] her home group. She also is speaking daily with her sponsor. The patient reported she had met a lot of really nice women who are very welcoming and supportive. She went to the pool and spent time with her friends. Her family had a cookout and her husband rested, but felt better. The kids also seem more at peace. The patient admitted deceitful behaviors that included putting some of her wine bottles in the neighbor's recycling. When asked why, she  explained that she didn't want her husband seeing all of the alcohol she had drunk. The patient displayed good insight into the handout and subsequent relapse and made some good observations. She continues to make good progress and is doing everything we have asked of her. The patient responded well to this intervention and her sobriety date remains 7/13.    Family Program: Family present? No   Name of family member(s):   UDS collected: Yes Results: negative  AA/NA attended?: YesFriday and Sunday  Sponsor?: Yes   [name], LCAS

## 2013-12-06 ENCOUNTER — Encounter (HOSPITAL_COMMUNITY): Payer: Self-pay | Admitting: Psychology

## 2013-12-06 NOTE — Progress Notes (Signed)
    Daily Group Progress Note  Program: CD-IOP   Group Time: 1-2:30 pm  Participation Level: Active  Behavioral Response: Appropriate and Sharing  Type of Therapy: Psycho-education Group  Topic: Pharmacist Visit: the first half of group was spent in a psycho-ed with a guest speaker. The guest was the pharmacist, EP, who dispense medications [facility]. She described the effects that different drugs have on the body. The guest also educated on the medications used to address various psychiatric conditions and fielded numerous questions from the group members. During this time, the medical director, CK, was meeting with new group members and current members with medication questions or concerns.   Group Time: 2:45- 4pm  Participation Level: Active  Behavioral Response: Sharing  Type of Therapy: Process Group  Topic: Process: the second half of group was spent in process. Members shared about current issues or concerns. A new member, who had returned after missing the last group session, was tearful as she recounted the events leading up to her relapse. Members were attentive and supportive, but they also reminded her that her sobriety must come first.   Summary: The patient was attentive and asked good questions of the pharmacist. She questioned the Xanax her husband is being prescribed for sleep. The patient wondered if it would be better for him to be prescribed Seroquel that would help him sleep and address his racing thoughts that keep him from a good sleep. The pharmacist provided good feedback. In process, the patient provided good feedback to the member who had relapsed and whose children had been taken from her. She noted that her husband is feeling better physically and getting back some of his energy. He is being treated for Stage 4 lung cancer. She noted that when she left for group he was reviewing cell phone, internet and cable bills. He is an  Airline pilotaccountant by trade. The patient reported she  and her family will be with her mother-in-law today because it is her 50th wedding anniversary. Her husband died in May of this year and this is the first year in 50 years without him. The patient will attend at least two meetings over the weekend and continues to speak with her sponsor daily. Her sobriety date remains 7/13.  Family Program: Family present? No   Name of family member(s):   UDS collected: No Results:   AA/NA attended?: YesThursday  Sponsor?: Yes   Devan Babino, LCAS

## 2013-12-07 ENCOUNTER — Other Ambulatory Visit (HOSPITAL_COMMUNITY): Payer: BLUE CROSS/BLUE SHIELD | Admitting: Psychology

## 2013-12-07 DIAGNOSIS — F10230 Alcohol dependence with withdrawal, uncomplicated: Secondary | ICD-10-CM

## 2013-12-07 DIAGNOSIS — F32A Depression, unspecified: Secondary | ICD-10-CM

## 2013-12-07 DIAGNOSIS — F329 Major depressive disorder, single episode, unspecified: Secondary | ICD-10-CM

## 2013-12-07 DIAGNOSIS — F1020 Alcohol dependence, uncomplicated: Secondary | ICD-10-CM | POA: Diagnosis not present

## 2013-12-09 ENCOUNTER — Encounter (HOSPITAL_COMMUNITY): Payer: Self-pay | Admitting: Psychology

## 2013-12-09 ENCOUNTER — Other Ambulatory Visit (HOSPITAL_COMMUNITY): Payer: BLUE CROSS/BLUE SHIELD | Admitting: Psychology

## 2013-12-09 DIAGNOSIS — F10230 Alcohol dependence with withdrawal, uncomplicated: Secondary | ICD-10-CM

## 2013-12-09 DIAGNOSIS — F329 Major depressive disorder, single episode, unspecified: Secondary | ICD-10-CM

## 2013-12-09 DIAGNOSIS — F1020 Alcohol dependence, uncomplicated: Secondary | ICD-10-CM | POA: Diagnosis not present

## 2013-12-09 DIAGNOSIS — F32A Depression, unspecified: Secondary | ICD-10-CM

## 2013-12-09 NOTE — Progress Notes (Signed)
    Daily Group Progress Note  Program: CD-IOP   Group Time: 1-2:30 pm  Participation Level: Active  Behavioral Response: Appropriate and Sharing  Type of Therapy: Process Group  Topic: Process: the first part of group was spent in process. Members shared bout the past weekend. They disclosed about the meetings they had attended and other activities that have supported their recovery as well as any challenges or temptations that may have presented themselves. One member had relapsed and she was asked to share the events that had led to her drinking. She did not respond to my request - I reminded group members that the sheer act of disclosing and speaking one's experience is therapeutic and, ultimately, healing.  Also present were 3 new group members. Each of the new members was invited to share a little bit about themselves with their new group members.   Group Time: 2:45- 4pm  Participation Level: Active  Behavioral Response: Sharing  Type of Therapy: Psycho-education Group  Topic: Cognitive Distortions: the second half of group was spent in a psycho-ed. Members were provided with a handout that identified different types of cognitive distortions. We read over the handout together and members commented on some of the distortions that they most frequently used. There was a good discussion and by sharing, members became more open and known among each other. During this group session, random drug tests were collected.   Summary: The patient reported a quiet weekend. She had spent a lot of time with her family. Her husband is feeling better than during the chemo and radiation, but he doesn't feel good enough much beyond sitting at home. He is eating more and she is relieved at this. The patient reported she had attended 2 AA meetings and met with her sponsor yesterday. She noted that a good friend from high school had died and his funeral was 06/26/2022. He had died from his alcoholism and had  cirrhosis. She reported it had been very sad and she had seen a lot of old high school friends. In the psycho-ed, the patient admitted she has never accepted compliments well. She disclosed that when we discussed the tendency to 'minimize' achievements or comment and accolades by others. The patient provided good feedback and displayed empathy and concern for her fellow group members. She is doing everything we have asked of her and her sobriety date remains 7/13.   Family Program: Family present? No   Name of family member(s):   UDS collected: Yes Results: not back yet  AA/NA attended?: YesSaturday and Sunday  Sponsor?: Yes   Lacinda Curvin, LCAS

## 2013-12-11 ENCOUNTER — Other Ambulatory Visit (HOSPITAL_COMMUNITY): Payer: BLUE CROSS/BLUE SHIELD

## 2013-12-14 ENCOUNTER — Other Ambulatory Visit (HOSPITAL_COMMUNITY): Payer: BLUE CROSS/BLUE SHIELD

## 2013-12-15 ENCOUNTER — Encounter (HOSPITAL_COMMUNITY): Payer: Self-pay | Admitting: Psychology

## 2013-12-15 NOTE — Progress Notes (Signed)
    Daily Group Progress Note  Program: CD-IOP   Group Time: 1-2:30 pm  Participation Level: Active  Behavioral Response: Appropriate and Sharing  Type of Therapy: Psycho-education Group  Topic: Chaplain: the first half of group was spent in a psycho-ed with a guest speaker. PW, a Chaplain with [employer] appeared and led a discussion on "Spirituality". The emphasis was on being present, acceptance and forgiveness. Members shared about their experiences and beliefs. When PW asked the group what they needed, a number of members agreed that they weren't sure that 'answers' would provide them with what they really needed. The session generated even more questions then before the session. During group today, the medical director, CK, met with 2 new group members and provided refills where needed.   Group Time: 2:45- 4pm  Participation Level: Active  Behavioral Response: Sharing  Type of Therapy: Process Group  Topic: Process: the second half of group was spent in process. Members shared about the past few days and any concerns or issues that may have challenged or questioned their sobriety. No one had relapsed since the last session. One member had not appeared yesterday for our individual appointment and I questioned why she had called and cancelled. She admitted her boyfriend had told her not to go. The group responded with disbelief. The importance of making choices and decisions in early recovery was emphasized. I also reminded members that if they didn't make the choices that were needed, others would make them for them. I disclosed that a guest facilitator would be present for the next two sessions and encouraged the group to engage as they always do.   Summary: The patient was attentive and engaged during the visit from the chaplain. She shared about her husband's health crisis and how she is just trying to stay in the present moment and not get too carried away. She agreed with the  chaplain that her husband and she had questions about "Why". The patient noted that many people have come up to her and expressed frustration and confusion about "why" this would happen to [place]. He is the nicest, kindest guy they know and yet he has been struck down by this Stage 4 Cancer, but never smoked anything. The patient provided support, but questioned the patient who had shared about not coming to meet with me because "I was told not to". the patient is doing very well in her recovery and has stated more than once, that she is glad to have stopped drinking. She is present for her family and what is happening right now. The patient responded well to this intervention and her sobriety date remains 7/13.    Family Program: Family present? No   Name of family member(s):   UDS collected: No Results:   AA/NA attended?: YesMonday and Tuesday  Sponsor?: Yes   Ethelbert Thain, LCAS

## 2013-12-16 ENCOUNTER — Other Ambulatory Visit (HOSPITAL_COMMUNITY): Payer: BLUE CROSS/BLUE SHIELD | Admitting: Psychology

## 2013-12-16 DIAGNOSIS — F329 Major depressive disorder, single episode, unspecified: Secondary | ICD-10-CM

## 2013-12-16 DIAGNOSIS — F32A Depression, unspecified: Secondary | ICD-10-CM

## 2013-12-16 DIAGNOSIS — F1020 Alcohol dependence, uncomplicated: Secondary | ICD-10-CM | POA: Diagnosis not present

## 2013-12-16 DIAGNOSIS — F10230 Alcohol dependence with withdrawal, uncomplicated: Secondary | ICD-10-CM

## 2013-12-17 ENCOUNTER — Encounter (HOSPITAL_COMMUNITY): Payer: Self-pay | Admitting: Psychology

## 2013-12-17 NOTE — Progress Notes (Signed)
    Daily Group Progress Note  Program: CD-IOP   Group Time: 1-2:30 pm  Participation Level: Active  Behavioral Response: Appropriate and Sharing  Type of Therapy: Process Group  Topic: Group Process: the first part of group was spent in process. Members shared about issues and concerns in early recovery. There was good feedback and discussion among the group. I had been gone for two sessions and there was some efforts to catch me up with events since last week. During this session, drug tests were collected from all group members.   Group Time: 2:45- 4pm  Participation Level: Active  Behavioral Response: Sharing  Type of Therapy: Psycho-education Group  Topic: "Step One"/Family: the second half of group was spent in a psycho-ed. As this half of group began, a member read her "Step One". It was a powerful reading and proved very emotional to the presenting group member. Later, members were provided handouts on the family dynamics in addicted or dysfunctional family systems. The different roles were discussed and members shared how they might have taken on some of these roles. The session proved very compelling and almost every member shared something about their family history.   Summary: The patient reported she had had a good week. She had spent most of the time with her family. She noted that her husband had felt good enough to go to work yesterday and today. He had told her it felt 'normal' and they were both pleased that he was able to go to the office. The patient reported another event that proved interesting. She had been present as her friends were having an argument over what was best for her recovery. She laughed as she explained the absurdity of this, but also acknowledging she was grateful for their concern. In the second half of group, the patient pointed out during the discussion that her family had adapted to Chuck's illness and had already changed their 'dance'  as a  result of his chemo and radiation treatments. Her youngest son was alarmed when his father wasn't sitting in his chair this morning and surprised when he was told his father was at work. The patient provided good feedback and her comments were revealing and displayed good insight. She continues to attend AA meetings and speaks with her sponsor daily. Her sobriety date remains 7/13.    Family Program: Family present? No   Name of family member(s):   UDS collected: Yes Results: not back yet  AA/NA attended?: YesMonday and Tuesday  Sponsor?: Yes   Dynasti Kerman, LCAS

## 2013-12-18 ENCOUNTER — Other Ambulatory Visit (HOSPITAL_COMMUNITY): Payer: BLUE CROSS/BLUE SHIELD | Admitting: Psychology

## 2013-12-18 DIAGNOSIS — F10230 Alcohol dependence with withdrawal, uncomplicated: Secondary | ICD-10-CM

## 2013-12-18 DIAGNOSIS — F1020 Alcohol dependence, uncomplicated: Secondary | ICD-10-CM | POA: Diagnosis not present

## 2013-12-18 DIAGNOSIS — F329 Major depressive disorder, single episode, unspecified: Secondary | ICD-10-CM

## 2013-12-18 DIAGNOSIS — F32A Depression, unspecified: Secondary | ICD-10-CM

## 2013-12-21 ENCOUNTER — Other Ambulatory Visit (HOSPITAL_COMMUNITY): Payer: BLUE CROSS/BLUE SHIELD | Admitting: Psychology

## 2013-12-21 ENCOUNTER — Encounter (HOSPITAL_COMMUNITY): Payer: Self-pay | Admitting: Psychology

## 2013-12-21 DIAGNOSIS — F1020 Alcohol dependence, uncomplicated: Secondary | ICD-10-CM | POA: Diagnosis not present

## 2013-12-21 NOTE — Progress Notes (Signed)
    Daily Group Progress Note  Program: CD-IOP   Group Time: 1-2:30 pm  Participation Level: Active  Behavioral Response: Appropriate and Sharing  Type of Therapy: Process Group  Topic: Group Process: the first part of group was spent in process. Members shared about current issues and concerns in early recovery. One member had missed the last group session and disclosed that she had relapsed Wednesday. She cried as she recounted this and admitted she felt as if she had let the group down. Members assured her that this was not the case. The relapse was diagrammed on the board with feedback from members about what could have been done differently. The session proved very informative.  A new group member was present and she introduced herself during this part of group. During the group session, the program director was meeting with new group members.   Group Time: 2:45- 4pm  Participation Level: Active  Behavioral Response: Sharing  Type of Therapy: Psycho-education Group  Topic: Family Sculpture: the second part of group was spent in a psycho-ed. Members were asked to 'sculpt' their biological family. An explanation was provided and 3 family sculptures were completed. The session proved emotional for some of those members and provided good insight and more understanding about group members' lives and experiences.   Summary: The patient reported she was doing okay. She admitted feeling a lot of emotion in the past week. The patient described how she has been working to get her kids ready for school. She is also going to return to work with [employer], but her time will be based on her availability around this program. I wondered if perhaps she has been dealing with the future in preparation of school, work, Catering manager., and also thinking about her husband and his health. The patient agreed that she hasn't been focusing on the present or even today and maybe that is why she is out of  sorts. She is afraid that her husband will start to get worse as his cancer treatment continues. The patient agreed to return to the moment or the present and get out of this future-oriented thinking and obsessing. The patient served in at least 2 of the family sculptures and she made some good comments about how she felt in the position she had been sculpted. She continues to make good progress and has done everything we have asked of her. The patient's sobriety date remains 7/13.    Family Program: Family present? No   Name of family member(s):   UDS collected: No Results:  AA/NA attended?: YesThursday  Sponsor?: Yes   Natoria Archibald, LCAS

## 2013-12-23 ENCOUNTER — Other Ambulatory Visit (HOSPITAL_COMMUNITY): Payer: BLUE CROSS/BLUE SHIELD | Admitting: Psychology

## 2013-12-23 DIAGNOSIS — F32A Depression, unspecified: Secondary | ICD-10-CM

## 2013-12-23 DIAGNOSIS — F329 Major depressive disorder, single episode, unspecified: Secondary | ICD-10-CM

## 2013-12-23 DIAGNOSIS — F10230 Alcohol dependence with withdrawal, uncomplicated: Secondary | ICD-10-CM

## 2013-12-23 DIAGNOSIS — F1020 Alcohol dependence, uncomplicated: Secondary | ICD-10-CM | POA: Diagnosis not present

## 2013-12-25 ENCOUNTER — Other Ambulatory Visit (HOSPITAL_COMMUNITY): Payer: BLUE CROSS/BLUE SHIELD | Admitting: Psychology

## 2013-12-25 ENCOUNTER — Encounter (HOSPITAL_COMMUNITY): Payer: Self-pay | Admitting: Psychology

## 2013-12-25 DIAGNOSIS — F32A Depression, unspecified: Secondary | ICD-10-CM

## 2013-12-25 DIAGNOSIS — F1020 Alcohol dependence, uncomplicated: Secondary | ICD-10-CM | POA: Diagnosis not present

## 2013-12-25 DIAGNOSIS — F10230 Alcohol dependence with withdrawal, uncomplicated: Secondary | ICD-10-CM

## 2013-12-25 DIAGNOSIS — F329 Major depressive disorder, single episode, unspecified: Secondary | ICD-10-CM

## 2013-12-27 ENCOUNTER — Encounter (HOSPITAL_COMMUNITY): Payer: Self-pay | Admitting: Psychology

## 2013-12-27 NOTE — Progress Notes (Signed)
    Daily Group Progress Note  Program: CD-IOP   Group Time: 1-2:30 pm  Participation Level: Active  Behavioral Response: Sharing  Type of Therapy: Process Group  Topic: Process; the first half of group was spent in process. Members shared about the past weekend and any issues or concerns they have experienced in early recovery. One member shared about concerns about rumors that have been spreading about having been drinking despite the fact that she has been sober for more than a month. One member brought something to honor today - the 5 year anniversary of another group member's father. She spoke about her feelings and realizations on this special day. All 5 group members present had remained clean and sober, per their reports and drug tests were collected from all present.  Group Time: 2:45- 4pm  Participation Level: Active  Behavioral Response: Sharing  Type of Therapy: Psycho-education Group  Topic: Family Sculpture: the second half of group was spent in a psycho-ed. Members participated in [facility]. A group member offered to 'sculpt' his family. Four group members were needed to provide the piece and the sculpture was very compelling. The 'artist' had tears as he recounted the actions portrayed by his sculpture and members were patient and caring as they asked questions about the positions. The session proved very powerful and painful for all. The patient admitted he didn't think he would be so affected by the experience, but agreed that it was helpful to get it out and view it from a distance. The session proved very powerful for those present.  Summary:  The patient reported she was struggling. She explained that many of her friends know that she is in a program. The patient reported someone had sent an email informing lots of friends that she had seen Darinda at the pool drinking. The patient admitted she was really frustrated and hurt by all this gossip and that is was not  true. Members provided good feedback and reminded her that she could only change herself and has to focus on what she is doing and not worry about others. Another member encouraged her to set boundaries and the patient seemed to resonate with this suggestion. The patient also noted that she is going to go back to work. She will work a party on Wednesday evening and report after the group session. She agreed that the kids have returned to school and it will help her to return to some sort of routine and schedule. In part two of group, the patient stood in for family members during the family sculpture. When asked how it felt to be in her 'sculpted' position, she admitted it was very uncomfortable and a little scary. The patient continues to do everything we have asked of her. She is prescribed Campral and is taking this medication and has experienced no cravings or desires to drink to date. She responded well to this intervention and her sobriety date remains 7/13.  Family Program: Family present? No   Name of family member(s):   UDS collected: Yes Results: negative  AA/NA attended?: [place]  Sponsor?: Yes   Conor Filsaime, LCAS

## 2013-12-28 ENCOUNTER — Other Ambulatory Visit (HOSPITAL_COMMUNITY): Payer: BLUE CROSS/BLUE SHIELD | Admitting: Psychology

## 2013-12-28 ENCOUNTER — Encounter (HOSPITAL_COMMUNITY): Payer: Self-pay | Admitting: Psychology

## 2013-12-28 DIAGNOSIS — F10230 Alcohol dependence with withdrawal, uncomplicated: Secondary | ICD-10-CM

## 2013-12-28 DIAGNOSIS — F1020 Alcohol dependence, uncomplicated: Secondary | ICD-10-CM | POA: Diagnosis not present

## 2013-12-28 DIAGNOSIS — F32A Depression, unspecified: Secondary | ICD-10-CM

## 2013-12-28 DIAGNOSIS — F329 Major depressive disorder, single episode, unspecified: Secondary | ICD-10-CM

## 2013-12-28 NOTE — Progress Notes (Signed)
    Daily Group Progress Note  Program: CD-IOP   Group Time: 1-2:30pm  Participation Level: Active  Behavioral Response: Appropriate and Sharing  Type of Therapy: Process Group  Topic: Group process: the first half of group was spent in process. Members shared about current issues and concerns in early recovery. There were two new group members present today. The medical director was meeting with group members today and he asked to speak with them during this session.  There were good comments and feedback during the half of group. Two drug tests were collected during this session.  Group Time: 2:45- 4pm  Participation Level: Active  Behavioral Response: Sharing  Type of Therapy: Psycho-education Group  Topic: Psycho-Ed: the second half of group was spent in a session on Family Sculpture. Group members picked other members to represent their families and they placed them in positions that represented a theme of their early life. Once again, as in the previous two sessions, the sculpture proved very powerful. There were strong emotions, including pain and hurt, witnessed by all during the sculpture. During this half of group, one of the new group members introduced herself and told her story. It proved very compelling and the new member displayed a depth of understanding that motivated those witnessing her story. The session proved very powerful.   Summary: The patient reported she is kind of over the gossip about her relapsing and recognized that she cannot change or control anybody but herself. She reported her husband is scheduled to meet with the radiologist tomorrow and they are hoping for more information about any progress in treating his cancer or what they might expect? She is relieved that her children are back in school and they will have a routine or schedule and life will return to some sort of normalcy. 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 The patient admitted she is looking forward to getting back to work and finds her boss very easy to be with and he is very validating. She was attentive and served in two family sculptures in the second half of group. The patient provided good feedback to her fellow group members and made some good points with her comments. The patient continues to make excellent progress in her recovery and her sobriety date remains 7/13.    Family Program: Family present? No   Name of family member(s):   UDS collected: No Results:   AA/NA attended?: YesMonday and Tuesday  Sponsor?: Yes   Caitlain Tweed, LCAS

## 2013-12-30 ENCOUNTER — Encounter (HOSPITAL_COMMUNITY): Payer: Self-pay | Admitting: Psychology

## 2013-12-30 ENCOUNTER — Other Ambulatory Visit (HOSPITAL_COMMUNITY): Payer: BLUE CROSS/BLUE SHIELD | Attending: Psychiatry

## 2013-12-30 DIAGNOSIS — F329 Major depressive disorder, single episode, unspecified: Secondary | ICD-10-CM | POA: Insufficient documentation

## 2013-12-30 DIAGNOSIS — F3289 Other specified depressive episodes: Secondary | ICD-10-CM | POA: Insufficient documentation

## 2013-12-30 DIAGNOSIS — F1020 Alcohol dependence, uncomplicated: Secondary | ICD-10-CM | POA: Insufficient documentation

## 2013-12-30 DIAGNOSIS — F411 Generalized anxiety disorder: Secondary | ICD-10-CM | POA: Insufficient documentation

## 2013-12-30 NOTE — Progress Notes (Signed)
    Daily Group Progress Note  Program: CD-IOP   Group Time: 1-2:30 pm  Participation Level: Active  Behavioral Response: Appropriate and Sharing  Type of Therapy: Process Group  Topic: Process:  The first portion of group was spent processing what had happened since the last meeting.  Group members shared about their weekend and recovery related activities and events that they engaged in.  A new group member was present and took time to introduce herself to the rest of the group.  The session was lively and each member engaged appropriately.  Group Time: 2:45- 4pm  Participation Level: Active  Behavioral Response: Sharing  Type of Therapy: Psycho-education Group  Topic: Psychoeducation:  The second half of group was spent discussing expectations of group therapy.  Two handouts were provided during this time.  The first outlined group commitments and appropriate behaviors, while the second emphasized the importance of group members' willingness to be open and receive feedback.  The session was intense and some members expressed concerns about their ability to hold others accountable.  No relapses were reported in group today.  Summary: The patient stated that she had a good weekend.  She and her husband were able to go on a date together, and she reported that she enjoyed it very much.  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  The patient stated that she felt lonely over the weekend because her husband played golf all weekend and was not at home very much.  She acknowledged that she feels guilt in being assertive with her husband, due to his cancer diagnosis.  The patient was an active participant in group and offered feedback and input during the session.  She is doing well in her recovery and her sobriety date remains 7/13.   Family Program: Family present? No   Name of family member(s):   UDS collected: No Results:  AA/NA attended?: YesSaturday  Sponsor?: Yes   Desiree Daise, LCAS

## 2013-12-31 ENCOUNTER — Encounter (HOSPITAL_COMMUNITY): Payer: Self-pay | Admitting: Psychology

## 2013-12-31 NOTE — Progress Notes (Signed)
    Daily Group Progress Note  Program: CD-IOP   Group Time: 1-2:30 pm  Participation Level: Active  Behavioral Response: Appropriate and Sharing  Type of Therapy: Process Group/Activity  Topic: Group Process/Progressive Relaxation Activity: the first part of group was spent in process. Members shared about current issues and concerns in early recovery. One member became resistant and irritated when challenged about lying late last week. Another member sided with her and reported he thought it was 'ridiculous' to be discussing these behaviors. Other group members appeared somewhat uncomfortable with the interactions, but encouraged the member to review her past behaviors more objectively. A guided progressive relaxation exercise was read with members closing their eyes and following the words. Most of the group members later reported enjoying the guided relaxation activity.  Group Time: 2:45- 4pm  Participation Level: Active  Behavioral Response: Sharing  Type of Therapy: Psycho-education Group  Topic: Movie - "Flight/Family Sculpture: the second part of the session opened with film clips from the movie "flight". The opening scene was watched and then discussed. Members identified the egotism of the leading character along with his quick anger and irritability. All agreed there was certainly no humility and lying was his norm. Later on, a member sculpted her family. There was good feedback among members as they discussed this woman's family sculpture. The session was powerful, but the two members who had become resistant remained distant throughout the remainder of the session and passive-aggressive behaviors were displayed during this session.    Summary: The patient reported she had attended a meeting last night and continues to speak with her sponsor daily. Another member asked about her work on Wednesday evening. The patient reported she had assisted in working a small dinner party  and described the sumptuous meal. She provided good support to her fellow group members and was very encouraging. In the second half of group, the patient sculpted her biological family. It was a picture of balance and remarkably healthy. She pointed out the closeness between herself and her identical twin sister. The patient made some excellent comment and continues to make very good progress in her recovery. Her sobriety date remains 7/13.    Family Program: Family present? No   Name of family member(s):   UDS collected: No Results:  AA/NA attended?: YesWednesday and Thursday  Sponsor?: Yes   [name], LCAS

## 2014-01-01 ENCOUNTER — Other Ambulatory Visit (HOSPITAL_COMMUNITY): Payer: BLUE CROSS/BLUE SHIELD | Admitting: Psychology

## 2014-01-01 DIAGNOSIS — F10230 Alcohol dependence with withdrawal, uncomplicated: Secondary | ICD-10-CM

## 2014-01-01 DIAGNOSIS — F32A Depression, unspecified: Secondary | ICD-10-CM

## 2014-01-01 DIAGNOSIS — F329 Major depressive disorder, single episode, unspecified: Secondary | ICD-10-CM

## 2014-01-01 NOTE — Progress Notes (Unsigned)
[name] CD-IOP: Treatment Plan Update. I met with the patient this morning before her CD-IOP group. We reviewed her progress towards the goals she identified when she first entered the program. I congratulated her on having remained alcohol-free since before entering the program. She also speaks with her sponsor on a daily basis and has attended [employer] as required in the program agreement. The patient remains compliant relative to her medications. They include Campral, Prozac and Trazadone. Her sleep is good and she has reported no cravings. She reported that her family has planned a beach trip over the week of Labor Day. It will be good to get her husband and kids to the beach and away from everything for a few days. Her husband has been doing well relative to his treatment for Stage 4 Lung Cancer and the radiologist cleared him to play golf when they met with him yesterday. The patient continues to make excellent progress in her recovery and she has proven to be a very valuable and reliable group member who provides good feedback and support to her fellow group members. She has stated in group and in our individual sessions how much she has enjoyed the sessions and has benefited in many ways from the program. The documentation was reviewed and the update completed accordingly. Her sobriety date remains 7/13.        Lajuana Patchell, LCAS

## 2014-01-06 ENCOUNTER — Other Ambulatory Visit (HOSPITAL_COMMUNITY): Payer: BLUE CROSS/BLUE SHIELD

## 2014-01-08 ENCOUNTER — Other Ambulatory Visit (HOSPITAL_COMMUNITY): Payer: BLUE CROSS/BLUE SHIELD

## 2014-01-11 ENCOUNTER — Encounter (HOSPITAL_COMMUNITY): Payer: Self-pay | Admitting: Psychology

## 2014-01-11 ENCOUNTER — Other Ambulatory Visit (HOSPITAL_COMMUNITY): Payer: BLUE CROSS/BLUE SHIELD

## 2014-01-11 NOTE — Progress Notes (Signed)
    Daily Group Progress Note  Program: CD-IOP   Group Time: 1-2:30 pm  Participation Level: Active  Behavioral Response: Appropriate and Sharing  Type of Therapy: Process Group  Topic: Group Process: the first part of group was spent in process. Members shared about current issues and concerns. One member arrived late and made some excuses about his 45 minute late arrival. Another member called him out on his "bullshit". In turn, she was questioned by fellow group members since she has never spoken out. They wondered what was going on with her? She also had many excuses and the group didn't appear to buy into her explanations. During this session, the program director met with new members as well as ones discharging.   Group Time: 2:45- 4pm  Participation Level: Active  Behavioral Response: Sharing  Type of Therapy: Psycho-education Group  Topic: Relapse Warning Signs/"Step One" Reading/Graduation: the second half of group was spent in a psycho-ed on relapse warning signs. A handout was provided with members identifying those listed. Members identified their relapse triggers and discussed them. A member read her "Step One" handout. It was a very poignant reading and she cried as she recounted the damage from her alcohol use. The session ended with two graduations. There was cake and a pie to celebrate and kind words shared by all.   Summary: The patient reported she was doing well. She had attended meetings and spoken with her sponsor. She admitted she could relate to another member about how spoiled and entitled her child is. She recounted how her son had phoned her repeatedly when he had to wait less than 5 minutes for her after school. The patient reported she and her husband disagree with how the children should be addressed. She asked questions of the member who had spoken out and pointed out that something must be wrong. She proved very insightful about this woman and generated her  subsequent disclosure. The patient shared kind words to the graduating members and encouraged the members to remain connected even after they graduate. The patient reported she will be at the [place] next [place], but will return on that next Monday. She is going to attend meetings while she is at [employer]. She continues to make excellent progress and her sobriety date remains 7/13.    Family Program: Family present? No   Name of family member(s):   UDS collected: No Results:   AA/NA attended?: YesThursday  Sponsor?: Yes   [name], LCAS

## 2014-01-13 ENCOUNTER — Other Ambulatory Visit (HOSPITAL_COMMUNITY): Payer: BLUE CROSS/BLUE SHIELD

## 2014-01-13 DIAGNOSIS — F329 Major depressive disorder, single episode, unspecified: Secondary | ICD-10-CM

## 2014-01-13 DIAGNOSIS — F32A Depression, unspecified: Secondary | ICD-10-CM

## 2014-01-13 DIAGNOSIS — F411 Generalized anxiety disorder: Secondary | ICD-10-CM

## 2014-01-13 DIAGNOSIS — F439 Reaction to severe stress, unspecified: Secondary | ICD-10-CM

## 2014-01-13 MED ORDER — TRAZODONE HCL 150 MG PO TABS: 150.0000 mg | ORAL_TABLET | Freq: Every day | ORAL | Status: DC

## 2014-01-13 MED ORDER — FLUOXETINE HCL 40 MG PO CAPS: 40.0000 mg | ORAL_CAPSULE | Freq: Every day | ORAL | Status: DC

## 2014-01-13 NOTE — Progress Notes (Unsigned)
  [facility] Chemical Dependency Intensive Outpatient Discharge Summary   LACINDA CURVIN [id number]  Date of Admission: 7/210/2015 Date of Discharge: 01/13/2014  Course of Treatment: Pt has successfully completed her CD IOP Program requirements for treatment of her alcohol dependency including attending Group  And Individual counseling sessions/AA meetings/obtaining sponsorship and Home group.  Goals and Activities to Help Maintain Sobriety: 1. Stay away from old friends who continue to drink and use mind-altering chemicals. 2. Continue practicing Fair Fighting rules in interpersonal conflicts. 3. Continue alcohol and drug refusal skills and call on support systems. 4.  Seek Cancer support group for spouses /Attend Al Anon support groups for family alcoholism  Referrals: Lovie Macadamia NP   Aftercare services: below 1. Attend AA/NA meetings 90 meetings in 90 days 2. Continue with sponsor and a home group in AA/ 3    Psychotherapist: Seek if needed  Next appointment: Oct 13 with Charmian Muff  Plan of Action to Address Continuing Problems: As above    Client has participated in the development of this discharge plan and has received a copy of this completed plan  Court Joy  01/13/2014   [facility] 01/13/2014

## 2014-01-15 ENCOUNTER — Other Ambulatory Visit (HOSPITAL_COMMUNITY): Payer: BLUE CROSS/BLUE SHIELD | Admitting: Psychology

## 2014-01-18 ENCOUNTER — Other Ambulatory Visit (HOSPITAL_COMMUNITY): Payer: BLUE CROSS/BLUE SHIELD | Admitting: Psychology

## 2014-01-18 DIAGNOSIS — F32A Depression, unspecified: Secondary | ICD-10-CM

## 2014-01-18 DIAGNOSIS — F10230 Alcohol dependence with withdrawal, uncomplicated: Secondary | ICD-10-CM

## 2014-01-18 DIAGNOSIS — F329 Major depressive disorder, single episode, unspecified: Secondary | ICD-10-CM

## 2014-01-19 ENCOUNTER — Encounter (HOSPITAL_COMMUNITY): Payer: Self-pay | Admitting: Psychology

## 2014-01-19 NOTE — Progress Notes (Signed)
    Daily Group Progress Note  Program: CD-IOP   Group Time: 1-2:30 pm  Participation Level: Active  Behavioral Response: Appropriate and Sharing  Type of Therapy: Process Group  Topic: After checking in, group members each shared what had happened and been going on for them since the last group meeting.  Two patients admitted they had relapsed and were able to receive support and encouragement from other group members.  Psychoeducation on the process of relapse was shared, and group members shared their own triggers, as well as things they could do to keep their thoughts from turning into cravings that would lead to using.  Drug test results were returned and questions about results were asked.  Group Time: 2:45- 4pm  Participation Level: Active  Behavioral Response: Sharing  Type of Therapy: Psycho-education Group  Topic: The second part of group was spent discussing the topic of communication.  The group received a handout outlining the four styles of communication, and each could identify for themselves what type of communication they used most often.  Assertive communication was discussed and the conversation will continue during the next group.  A drug test was collected from a patient who was absent last group.  Summary: The patient described a situation that happened since our last group meeting that made her feel angry.  A friend who is in her carpool group was talking to her on the phone and asked her point blank if she was sober.  The way the question was asked caught the patient off guard and made her angry.   She shared that she has not discussed her progress with this particular friend and understands that she does not want her child being driven around by someone who has been drinking.  However, the question still made the patient feel angry.  She was able to discuss some of her triggers and some distractions and coping skills that she could use to prevent a relapse.  The  patient will graduate from the program on Wednesday.  Interventions are proving effective and the patient's sobriety date remains 7/13.   Family Program: Family present? No   Name of family member(s):   UDS collected: No Results:   AA/NA attended?: YesThursday  Sponsor?: Yes   Kushal Saunders, LCAS

## 2014-01-20 ENCOUNTER — Other Ambulatory Visit (HOSPITAL_COMMUNITY): Payer: BLUE CROSS/BLUE SHIELD | Admitting: Psychology

## 2014-01-20 ENCOUNTER — Encounter (HOSPITAL_COMMUNITY): Payer: Self-pay

## 2014-01-21 ENCOUNTER — Encounter (HOSPITAL_COMMUNITY): Payer: Self-pay | Admitting: Psychology

## 2014-01-21 NOTE — Progress Notes (Signed)
    Daily Group Progress Note  Program: CD-IOP   Group Time: 1-2:30 pm  Participation Level: Active  Behavioral Response: Appropriate and Sharing  Type of Therapy: Process Group  Topic: After checking in, group members shared what had been going on since our last group meeting, including issues that had come up and meetings they had attended.  Two group members admitted that they had relapsed over the weekend. They were able to process their triggers and feelings related to the relapse.  There was one new group member present who took time to introduce himself and explain what has brought him to our program.  Patients discussed the Feelings Wheel worksheet and identified what they were currently feeling in group.  They were encouraged to keep a copy of the worksheet handy to help them identify their feelings outside of group.  Group members were also informed of a few patients who had been discharged from the program. Drug tests were collected from all group members.  Group Time: 2:45-4  pm  Participation Level: Active  Behavioral Response: Sharing  Type of Therapy: Psycho-education Group  Topic: The second part of group was spent continuing our discussion about communication.  The four styles of communication learned in the last group session were reviewed, and assertive communication was discussed in more detail.  Patients discussed their difficulties with being assertive in their lives, and were able to review different refusal skills, ways to say no, ways to express their anger or hurt, and ways to accept compliments from others.  Drug tests were collected in group today, and two patients received drug test results.  Summary: The patient reported that she and her husband had dinner with friends on Friday night and had a good time together.  She also stated that she worked for ten hours on Saturday and was very sore and exhausted afterward, due to the fact that she has not worked like  that in months.  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  The patient shared good news from her husband's doctor that she received this morning.  Her husband's scan showed that the cancer is gone from his liver and that his tumors had shrunk.  They are still not sure about his brain, but he has a brain scan scheduled for November 5th.  The patient stated that she was relieved and "grateful" after receiving the news and admitted that she had been very scared and emotional over the weekend anticipating the results.  The patient was very active during group discussion and  Provided excellent feedback to her fellow group members. She agreed that she will continue to work on being more honesty about her feelings and assertive about asking for what she needs. The patient reported she would attend the [facility] AA meeting tonioght at 7 pm and invited the new group member to go there too. The patient will graduate from the program on Wednesday, and her sobriety date remains 7/13.  Family Program: Family present? No   Name of family member(s):   UDS collected: Yes Results: negative  AA/NA attended?: YesFriday  Sponsor?: Yes   Adhvik Canady, LCAS

## 2014-01-22 ENCOUNTER — Encounter (HOSPITAL_COMMUNITY): Payer: Self-pay | Admitting: Psychology

## 2014-01-22 ENCOUNTER — Other Ambulatory Visit (HOSPITAL_COMMUNITY): Payer: BLUE CROSS/BLUE SHIELD

## 2014-01-22 NOTE — Progress Notes (Signed)
    Daily Group Progress Note  Program: CD-IOP   Group Time: 1-2:30 pm  Participation Level: Active  Behavioral Response: Appropriate and sharing  Type of Therapy: Process Group  Topic: After checking in, the first part of group was spent sharing what had been going on for group members since our last meeting.  Group members checked in about meetings they had been going to, challenges they had faced, and provided feedback for other members.  The group then transitioned into psychoeducation about boundaries.  The concept of boundaries and why they are important was discussed.  Drug tests were also returned to patients today.  Group Time: 2:45- 4pm  Participation Level: Active  Behavioral Response: Sharing  Type of Therapy: Psycho-education Group  Topic: The second part of group was spent finishing the conversation about boundaries, and ended with a group member's graduation from the program.  Group members identified why they are afraid of setting boundaries, and were able to talk about people in their lives they need to set boundaries with.  Prior to the graduation, the counselor provided information about dopamine, its role in the brain, and how addiction affects dopamine levels in the brain.  Psychoeducation on boundaries will continue in the next group meeting.  Summary: Today was the patient's last group session.  She reported that she had attended a meeting on Monday that she really enjoyed, and that she worked for most of the day on Tuesday.  The patient was active during group discussion, and provided valuable feedback to other members.  She discussed how much she has enjoyed being in the group and how much she will miss it.  she encouraged the new group members to be honest and open and they will surely benefit from being here. Interventions have proven effective, and the patient's sobriety date remains 7/13. She has done everything asked of her and leaves the program having  completed successfully.   Family Program: Family present? No   Name of family member(s):   UDS collected: No Results:   AA/NA attended?: YesMonday  Sponsor?: Yes   Idonia Zollinger, LCAS

## 2014-01-27 ENCOUNTER — Other Ambulatory Visit: Payer: Self-pay | Admitting: Internal Medicine

## 2014-01-28 ENCOUNTER — Encounter (HOSPITAL_COMMUNITY): Payer: Self-pay

## 2014-01-28 NOTE — Progress Notes (Unsigned)
    Daily Group Progress Note  Program: CD-IOP   Group Time: 1-2:30 pm  Participation Level: Active  Behavioral Response: Sharing  Type of Therapy: Psycho-education Group  Topic: Triggers: the first part of group was spent in a psycho-ed on "Triggers". Members identified some of their triggers and a discussion followed on addressing these triggers. There was good disclosure and feedback among group members.  Group Time: 2:45-4 pm  Participation Level: Minimal  Behavioral Response: Resistant  Type of Therapy: Psycho-education Group  Topic: Refusal Skills: the second half of group was spent discussing handouts and refusal skills. Members read from the handouts and discussed the options available when confronted or feeling pressured to use. Members challenged some of the examples, but the ensuing discussion was able to put them into perspective.   Summary: Patient was present for group and participated. Patient reported that she has not experienced much difficulty saying "no" when offered alcohol. Patient reported that it was important for her to say no so that her friends can understand that she can still participate in activities while sober. Patient reports that she had fear of being excluded from activities due to being in recovery. Patient reports that her friends are aware that she is in recovery and they encourage her and she still participates in activities while sober. The patient participated minimally in the 2nd part of group. She read from the handout, but offered little of herself during the discussion.      Family Program: Family present? No   Name of family member(s):   UDS collected: No Results:  AA/NA attended?: YesSaturday  Sponsor?: Yes   [facility]

## 2014-02-01 NOTE — Progress Notes (Unsigned)
[name] is a 48 y.o. female patient ***. CD-IOP: The patient successfully completed the CD-IOP on Wednesday, January 20, 2014. She is discharged "successful" from our program. Please see Discharge Summary and Plan in Epic.        Mustapha Colson, LCAS

## 2014-03-19 ENCOUNTER — Other Ambulatory Visit (HOSPITAL_COMMUNITY): Payer: Self-pay | Admitting: Medical

## 2014-06-24 ENCOUNTER — Ambulatory Visit (INDEPENDENT_AMBULATORY_CARE_PROVIDER_SITE_OTHER): Payer: BLUE CROSS/BLUE SHIELD | Admitting: Psychiatry

## 2014-06-24 ENCOUNTER — Ambulatory Visit (HOSPITAL_COMMUNITY): Payer: Self-pay | Admitting: Psychiatry

## 2014-06-24 ENCOUNTER — Encounter (INDEPENDENT_AMBULATORY_CARE_PROVIDER_SITE_OTHER): Payer: Self-pay

## 2014-06-24 ENCOUNTER — Encounter (HOSPITAL_COMMUNITY): Payer: Self-pay | Admitting: Psychiatry

## 2014-06-24 DIAGNOSIS — F1021 Alcohol dependence, in remission: Secondary | ICD-10-CM | POA: Diagnosis not present

## 2014-06-24 DIAGNOSIS — F439 Reaction to severe stress, unspecified: Secondary | ICD-10-CM

## 2014-06-24 DIAGNOSIS — F411 Generalized anxiety disorder: Secondary | ICD-10-CM

## 2014-06-24 DIAGNOSIS — F331 Major depressive disorder, recurrent, moderate: Secondary | ICD-10-CM | POA: Diagnosis not present

## 2014-06-24 DIAGNOSIS — F32A Depression, unspecified: Secondary | ICD-10-CM

## 2014-06-24 DIAGNOSIS — G4700 Insomnia, unspecified: Secondary | ICD-10-CM

## 2014-06-24 DIAGNOSIS — F329 Major depressive disorder, single episode, unspecified: Secondary | ICD-10-CM

## 2014-06-24 MED ORDER — HYDROXYZINE PAMOATE 25 MG PO CAPS: ORAL_CAPSULE | ORAL | Status: DC

## 2014-06-24 MED ORDER — FLUOXETINE HCL 20 MG PO CAPS: 60.0000 mg | ORAL_CAPSULE | Freq: Every day | ORAL | Status: DC

## 2014-06-24 NOTE — Progress Notes (Signed)
Psychiatric Assessment Adult  Patient Identification:  [name] Date of Evaluation:  06/24/2014 Chief Complaint: depression and alcohol use History of Chief Complaint:   Chief Complaint  Patient presents with  . Depression    HPI Comments: Pt has completed the CD-IOP and was referred for medication management.   Stressors- pt's husband has stage 4 lung cancer with mets in dx July 2015. He is on treatment and is doing well. Family stressors with her kids are on going. Pt is fighting with her older son and he is upset about her past alcohol use.    Pt had her last alcoholic drink on Apr 29, 2014 and went [facility].Pt has been drinking heavily for at least a year or longer. It was triggered due to to lonliness and depression. Son and husband would go golfing and leave her alone a lot. Pt was drinking about 1/2 gallon every 4 days of vodka. She had other types of alcohol as well but mostly vodka. She completed the CD-IOP in July 2015 and stayed sober until the end of December 2015. Pt relapsed and then went to [facility] in Jan 2016. Pt has been sober since and is going to AA 4-5x/week and has sponser. Pt is working on the steps and is on step 7. Pt denies cravings. Pt never took Acamprosate. Pt declined treatment Antabuse and Acamprosate at this time. Pt states she has a lot of support.   Depression has improved. December was difficult and states she was struggling at that time. She continues to feel depressed 2 days a week. Reports sad mood, low motivation, low energy, isolation, anhedonia. Pt has a little worthless feeling but denies hopelessness. Pt does the things she has to do to take care of herself and family. Appetite and concentration are fair. Sleep is getting about 6 hrs of broken sleep. Seroquel is not as effective as it was in the beginning. Doctor at [facility] did not want her to take Seroquel for long. He was worried about pt developing SE. Pt denies  SE. She is taking Prozac  and feels it is helping some. Denies SE from Prozac. Denies SI/HI.   Review of Systems Physical Exam  Psychiatric: Her speech is normal and behavior is normal. Judgment and thought content normal. Cognition and memory are normal. She exhibits a depressed mood.    Depressive Symptoms: depressed mood, anhedonia, insomnia, fatigue, feelings of worthlessness/guilt, loss of energy/fatigue, disturbed sleep,  (Hypo) Manic Symptoms:   Elevated Mood:  No Irritable Mood:  No Grandiosity:  No Distractibility:  No Labiality of Mood:  No Delusions:  No Hallucinations:  No Impulsivity:  No Sexually Inappropriate Behavior:  No Financial Extravagance:  No Flight of Ideas:  No  Anxiety Symptoms: Excessive Worry:  Yes managable Panic Symptoms:  Yes last time was several years ago when stressed. Pt thinks it was due to alcohol use Agoraphobia:  No Obsessive Compulsive: No  Symptoms: None, Specific Phobias:  No Social Anxiety:  No  Psychotic Symptoms:  Hallucinations: No None Delusions:  No Paranoia:  No   Ideas of Reference:  No  PTSD Symptoms: Ever had a traumatic exposure:  No Had a traumatic exposure in the last month:  No Re-experiencing: No None Hypervigilance:  No Hyperarousal: No None Avoidance: No None  Traumatic Brain Injury: No   Past Psychiatric History: Diagnosis: Depression, Anxiety dx in Oct 2014 by PCP and Alcohol Dependence  Hospitalizations: denies  [facility]  CD IOP 7/`5/15  Substance Abuse Care: [facility] for alcohol in Jan 2016  Self-Mutilation: denies  Suicidal Attempts: denies, denies access to guns  Violent Behaviors: denies   Past Medical History:   Past Medical History  Diagnosis Date  . Depression   . Allergy   . Alcohol dependence    History of Loss of Consciousness:  No Seizure History:  No Cardiac History:  No Allergies:   Allergies  Allergen Reactions  . Erythromycin     Stomach   cramps   Current Medications:  Current Outpatient Prescriptions  Medication Sig Dispense Refill  . Fish Oil OIL 3 tablets by Does not apply route.    Marland Kitchen. FLUoxetine (PROZAC) 40 MG capsule Take 1 capsule (40 mg total) by mouth daily. 30 capsule 3  . Multiple Vitamin (MULTIVITAMIN) capsule Take 1 capsule by mouth daily.    Marland Kitchen. omeprazole (PRILOSEC) 20 MG capsule Take 20 mg by mouth daily.    . QUEtiapine (SEROQUEL) 100 MG tablet Take 100 mg by mouth at bedtime.    Marland Kitchen. acamprosate (CAMPRAL) 333 MG tablet Take 333 mg by mouth 3 (three) times daily.    . traZODone (DESYREL) 150 MG tablet Take 1 tablet (150 mg total) by mouth at bedtime. 30 tablet 1   No current facility-administered medications for this visit.    Previous Psychotropic Medications:  Medication Dose   Lexapro    Xanax  1mg   Trazodone                Substance Abuse History in the last 12 months: Substance Age of 1st Use Last Use Amount Specific Type  Nicotine  denies     Alcohol- see HPI  21 Apr 29, 2014 Was drinking 1/2 gallon every 4 days Vodka and other things  Cannabis  [age]  in college tried a few times  smoked pot  Opiates  denies     Cocaine  denies     Methamphetamines  denies     LSD  denies     Ecstasy  denies     Benzod[age]ines  [age] Denies abusing it but records show she was intoxicated with Xanax and alcohol Xanax  Caffeine   today 1-2 coffee or diet coke per day   Inhalants  denies      Others: denies                         Medical Consequences of Substance Abuse: denies  Legal Consequences of Substance Abuse: denies Family Consequences of Substance Abuse: may embarrassing situations with family   Blackouts:  No DT's:  No Withdrawal Symptoms:  No None  Social History: [place] with husband and 2 kids, 1 dog and Press photographercat [place] Birth[place]. 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 Legal History: denies Hobbies/Interests: Cooking/tennis /swim/family and friends  Family History:   Family History  Problem Relation Age of Onset  . Cancer Mother 2055    breast  . Diabetes Maternal Grandmother   .  Heart disease Maternal Grandmother   . Stroke Maternal Grandmother   . Early death Neg Hx   . Alcohol abuse Sister   . Alcohol abuse Paternal Grandmother     Mental Status Examination/Evaluation: Objective: Attitude: Calm and cooperative  Appearance: Fairly Groomed, appears to be stated age  Eye Contact::  Good  Speech:  Clear and Coherent and Normal Rate  Volume:  Normal  Mood:  depressed  Affect:  Congruent  Thought Process:  Goal Directed, Linear and Logical  Orientation:  Full (Time, Place, and Person)  Thought Content:  Negative  Suicidal Thoughts:  No  Homicidal Thoughts:  No  Judgement:  Fair  Insight:  Fair  Concentration: good  Memory: Immediate-intact Recent-intact Remote-intact  Recall: fair  Language: fair  Gait and Station: normal  [employer] of Knowledge: average  Psychomotor Activity:  Normal  Akathisia:  No  Handed:  Right  AIMS (if indicated):  Facial and Oral Movements  Muscles of Facial Expression: None, normal  Lips and Perioral Area: None, normal  Jaw: None, normal  Tongue: None, normal Extremity Movements: Upper (arms, wrists, hands, fingers): None, normal  Lower (legs, knees, ankles, toes): None, normal,  Trunk Movements:  Neck, shoulders, hips: None, normal,  Overall Severity : Severity of abnormal movements (highest  score from questions above): None, normal  Incapacitation due to abnormal movements: None, normal  [name] awareness of abnormal movements (rate only patient's report): No Awareness, Dental Status  Current problems with teeth and/or dentures?: No  Does patient usually wear dentures?: No    Assets:  Communication Skills Desire for Improvement Financial Resources/Insurance Housing Resilience Social Support Talents/Skills Transportation Vocational/Educational        Laboratory/X-Ray Psychological Evaluation(s)   11/02/2013 AST 61, MCH 34.4, USD neg  denies   Assessment:    AXIS I MDD- recurrent, moderate; Alcohol dependence in early remission  AXIS II Deferred  AXIS III Past Medical History  Diagnosis Date  . Depression   . Allergy   . Alcohol dependence      AXIS IV other psychosocial or environmental problems  AXIS V 51-60 moderate symptoms   Treatment Plan/Recommendations:  Plan of Care:  Medication management with supportive therapy. Risks/benefits and SE of the medication discussed. Pt verbalized understanding and verbal consent obtained for treatment.  Affirm with the patient that the medications are taken as ordered. Patient expressed understanding of how their medications were to be used.   Confidentiality and exclusions reviewed with pt who verbalized understanding.   Laboratory:  Pt will sign MR to get labs from [facility] Jan 2016  Psychotherapy: Therapy: brief supportive therapy provided. Discussed psychosocial stressors in detail.    Encouraged and validated sobriety  Medications: increase Prozac to  po qD for depression D/c Seroquel Start trial of Vistaril 25-50mg  po qHS prn insomnia and anxiety  Routine PRN Medications:  Yes  Consultations: encouraged to continue individual therapy  Safety Concerns:  Pt denies SI and is at an acute low risk for suicide.Patient told to call clinic if any problems occur. Patient advised to go to ER if they should  develop SI/HI, side effects, or if symptoms worsen. Has crisis numbers to call if needed. Pt verbalized understanding.   Other:  F/up in 2 months or sooner if needed     Oletta Darter, MD 2/25/201610:14 AM

## 2014-08-24 ENCOUNTER — Ambulatory Visit (INDEPENDENT_AMBULATORY_CARE_PROVIDER_SITE_OTHER): Payer: BLUE CROSS/BLUE SHIELD | Admitting: Psychiatry

## 2014-08-24 ENCOUNTER — Encounter (HOSPITAL_COMMUNITY): Payer: Self-pay | Admitting: Psychiatry

## 2014-08-24 VITALS — BP 164/102 | HR 84 | Ht 62.0 in | Wt 197.4 lb

## 2014-08-24 DIAGNOSIS — F331 Major depressive disorder, recurrent, moderate: Secondary | ICD-10-CM

## 2014-08-24 DIAGNOSIS — F1021 Alcohol dependence, in remission: Secondary | ICD-10-CM | POA: Diagnosis not present

## 2014-08-24 MED ORDER — FLUOXETINE HCL 20 MG PO CAPS: 60.0000 mg | ORAL_CAPSULE | Freq: Every day | ORAL | Status: DC

## 2014-08-24 MED ORDER — QUETIAPINE FUMARATE 100 MG PO TABS: 100.0000 mg | ORAL_TABLET | Freq: Every day | ORAL | Status: DC

## 2014-08-24 MED ORDER — ACAMPROSATE CALCIUM 333 MG PO TBEC: 333.0000 mg | DELAYED_RELEASE_TABLET | Freq: Three times a day (TID) | ORAL | Status: DC

## 2014-08-24 NOTE — Progress Notes (Signed)
[facility] [id number] Progress Note  [name] [id number] 49 y.o.  08/24/2014 1:06 PM  Chief Complaint: Prozac is not working  History of Present Illness: Pt is not drinking alcohol. She denies cravings and is using Acamprosate, denies SE. She goes to AA 3-4x/week.   Pt states Vistaril is not helping with sleep. She stopped taking it one month. Pt is sleeping about 6 hrs/night but doesn't feel it is enough. Energy is ok and she can do what she has to do. Appetite and concentration are ok.   States Prozac is ineffective. Depression is getting worse and near daily level is 7/10. She is very sad and irritable. Reports crying spells and isolation. Pt sees her friends if they reach out to her but she is not seeking them out. Reports on/off worthlessness and hopelessness. Denies anhedonia.   Anxiety is high. She feels uncomfortable in her own skin and feels nervous.   Taking Prozac as prescribed and denies SE.   Suicidal Ideation: No she had the one episode of brief SI without plan or intent last week that resolved within minutes. Plan Formed: No Patient has means to carry out plan: No  Homicidal Ideation: No Plan Formed: No Patient has means to carry out plan: No  Review of Systems: Psychiatric: Agitation: Yes Hallucination: No Depressed Mood: Yes Insomnia: Yes Hypersomnia: No Altered Concentration: No Feels Worthless: Yes Grandiose Ideas: No Belief In Special Powers: No New/Increased Substance Abuse: No Compulsions: No  Neurologic: Headache: No Seizure: No Paresthesias: No   Review of Systems  Constitutional: Negative for fever, chills and weight loss.  HENT: Negative for congestion, ear pain, nosebleeds and sore throat.   Eyes: Negative for blurred vision, double vision and pain.  Respiratory: Negative for cough, sputum production and shortness of breath.   Cardiovascular: Negative for chest pain, palpitations and leg swelling.  Gastrointestinal: Negative  for heartburn, nausea, vomiting and abdominal pain.  Musculoskeletal: Positive for back pain and neck pain. Negative for joint pain.  Skin: Negative for itching and rash.  Neurological: Negative for dizziness, sensory change, seizures, loss of consciousness and headaches.  Endo/Heme/Allergies: Positive for environmental allergies.  Psychiatric/Behavioral: Positive for depression. Negative for suicidal ideas, hallucinations and substance abuse. The patient is nervous/anxious and has insomnia.      Past Medical Family, Social History: living in [facility] with husband and 2 kids, 1 dog and Press photographer of Birth: [place]. 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 She has never used smokeless tobacco. She reports that she drinks alcohol. She reports that she does not use illicit drugs.  Family History  Problem Relation Age of Onset  . Cancer Mother 34    breast  . Diabetes Maternal Grandmother   . Heart disease Maternal Grandmother   . Stroke Maternal Grandmother   . Early death Neg Hx   . Alcohol abuse Sister   . Alcohol abuse Paternal Grandmother    Past Medical History  Diagnosis Date  . Depression   . Allergy   . Alcohol dependence     Outpatient Encounter Prescriptions as of 08/24/2014  Medication Sig  . acamprosate (CAMPRAL) 333 MG tablet Take 333 mg by mouth 3 (three) times daily.  . Fish  Oil OIL 3 tablets by Does not apply route.  Marland Kitchen FLUoxetine (PROZAC) 20 MG capsule Take 3 capsules (60 mg total) by mouth daily.  . Multiple Vitamin (MULTIVITAMIN) capsule Take 1 capsule by mouth daily.  Marland Kitchen omeprazole (PRILOSEC) 20 MG capsule Take 20 mg by mouth daily.  . hydrOXYzine (VISTARIL) 25 MG capsule  Take 1-2 tabs po qHS prn insomnia (Patient not taking: Reported on 08/24/2014)    Past Psychiatric History/Hospitalization(s): Anxiety: Yes Bipolar Disorder: No Depression: Yes Mania: No Psychosis: No Schizophrenia: No Personality Disorder: No Hospitalization for psychiatric illness: No History of Electroconvulsive Shock Therapy: No Prior Suicide Attempts: No  Physical Exam: Constitutional:  BP 147/98 mmHg  Pulse 88  Ht  (1.575 m)  Wt 197 lb 6.4 oz (89.54 kg)  BMI 36.10 kg/m2  General Appearance: alert, oriented, no acute distress  Musculoskeletal: Strength & Muscle Tone: within normal limits Gait & Station: normal Patient leans: N/A  Mental Status Examination/Evaluation: Objective: Attitude: Calm and cooperative  Appearance: Casual, appears to be stated age  Eye Contact::  Fair  Speech:  Clear and Coherent and Normal Rate  Volume:  Normal  Mood:  depressed  Affect:  Depressed and Tearful  Thought Process:  Goal Directed  Orientation:  Full (Time, Place, and Person)  Thought Content:  Negative  Suicidal Thoughts:  No  Homicidal Thoughts:  No  Judgement:  Fair  Insight:  Fair  Concentration: good  Memory: Immediate-fair Recent-fair Remote-fair  Recall: fair  Language: fair  Gait and Station: normal  [employer] of Knowledge: average  Psychomotor Activity:  Normal  Akathisia:  No  Handed:  Right  AIMS (if indicated):  Facial and Oral Movements  Muscles of Facial Expression: None, normal  Lips and Perioral Area: None, normal  Jaw: None, normal  Tongue: None, normal Extremity Movements: Upper (arms, wrists, hands, fingers): None, normal  Lower (legs, knees, ankles, toes): None, normal,  Trunk Movements:  Neck, shoulders, hips: None, normal,  Overall Severity : Severity of abnormal movements (highest score from questions above): None, normal  Incapacitation due to abnormal movements: None, normal  [name] awareness of abnormal movements (rate  only patient's report): No Awareness, Dental Status  Current problems with teeth and/or dentures?: No  Does patient usually wear dentures?: No    Assets:  Communication Skills Desire for Improvement Financial Resources/Insurance Housing Resilience Social Support TEFL teacher (Choose Three): Review of Psycho-Social Stressors (1), Review or order clinical lab tests (1), Established Problem, Worsening (2), Review of Medication Regimen & Side Effects (2) and Review of New Medication or Change in Dosage (2)  Assessment: AXIS I MDD- recurrent, moderate; Alcohol dependence in early remission  AXIS II Deferred  AXIS III Past Medical History  Diagnosis Date  . Depression   . Allergy   . Alcohol dependence      AXIS IV other psychosocial or environmental problems  AXIS V 51-60 moderate symptoms   Treatment Plan/Recommendations:  Plan of Care: Medication management with supportive therapy. Risks/benefits and SE of the medication discussed. Pt verbalized understanding and verbal consent obtained for treatment. Affirm with the patient that the medications are taken as ordered. Patient expressed understanding of how their medications were to be used.     Laboratory:reviewed labs from [facility] in Jan 2016- UA neg, CBC WNL, BMP WNL, Chol 222, Trig 190, LDL 129; Hepatitis panel neg, Hepatic panel WNL, TSH WNL, HIV nonreactive; UDS + benzos and alcohol  Psychotherapy:  Therapy: brief supportive therapy provided. Discussed psychosocial stressors in detail.   Encouraged and validated sobriety   Medications:  Prozac to 60mg  po qD for depression Restart Seroquel 100mg  po qHS for depression, anxiety and sleep Continue Campral 333mg  po TID for alcohol cravings D/c Vistaril   - Pt has elevated lipid panel and is on Fish oil. PCP is monitoring and treating her cholesterol issues. We will restart Seroquel and continue  to monitor her lipid panel.   Routine PRN Medications: Yes  Consultations: encouraged to continue individual therapy - advised to monitor BP at home and f/up with PCP  Safety Concerns: Pt denies SI and is at an acute low risk for suicide.Patient told to call clinic if any problems occur. Patient advised to go to ER if they should develop SI/HI, side effects, or if symptoms worsen. Has crisis numbers to call if needed. Pt verbalized understanding.   Other: F/up in 2 months or sooner if needed          Oletta DarterAGARWAL, Caliyah Sieh, MD 08/24/2014

## 2014-09-19 ENCOUNTER — Other Ambulatory Visit (HOSPITAL_COMMUNITY): Payer: Self-pay | Admitting: Psychiatry

## 2014-09-21 NOTE — Telephone Encounter (Signed)
Refill request for Vistaril declined as medication was discontinued by Dr. Michae KavaAgarwal on 08/24/14.

## 2014-10-12 ENCOUNTER — Ambulatory Visit (HOSPITAL_COMMUNITY): Payer: Self-pay | Admitting: Psychiatry

## 2014-10-16 ENCOUNTER — Other Ambulatory Visit (HOSPITAL_COMMUNITY): Payer: Self-pay | Admitting: Psychiatry

## 2014-10-16 DIAGNOSIS — F331 Major depressive disorder, recurrent, moderate: Secondary | ICD-10-CM

## 2014-10-22 NOTE — Telephone Encounter (Signed)
Telephone message left for patient to inform Dr. Michae Kava was out on maternity leave and of need to have her rescheduled due to missed appointment on 10/12/14.  Questioned patient in message if she was in need of Seroquel and Prozac medication and requested she call back to schedule her new appointment with Dr. Rutherford Limerick while Dr. Michae Kava is out and to discuss needed refills.

## 2014-10-27 ENCOUNTER — Telehealth (HOSPITAL_COMMUNITY): Payer: Self-pay

## 2014-10-27 NOTE — Telephone Encounter (Signed)
Met with Dr. Salem Senate who authorized a one time refill of patient's pharmacy requested Seroquel.  Called in order after it was printed out by mistake per approval of Dr. Hendricks Milo to [place] on [address].  Informed Truddie Crumble, pharmacist to please place on prescription patient needed to call for further refills. Left patient a message on her voicemail she needed to call the office to schedule an appointment with Dr. Salem Senate for first available and no further refills until seen as Dr. Doyne Keel is now out on maternity leave.

## 2014-11-15 ENCOUNTER — Other Ambulatory Visit (HOSPITAL_COMMUNITY): Payer: Self-pay | Admitting: Psychiatry

## 2014-11-18 ENCOUNTER — Ambulatory Visit (INDEPENDENT_AMBULATORY_CARE_PROVIDER_SITE_OTHER): Payer: BLUE CROSS/BLUE SHIELD | Admitting: Psychiatry

## 2014-11-18 ENCOUNTER — Encounter (HOSPITAL_COMMUNITY): Payer: Self-pay | Admitting: Psychiatry

## 2014-11-18 VITALS — BP 154/98 | HR 72 | Ht 62.0 in | Wt 193.0 lb

## 2014-11-18 DIAGNOSIS — F411 Generalized anxiety disorder: Secondary | ICD-10-CM

## 2014-11-18 DIAGNOSIS — F1010 Alcohol abuse, uncomplicated: Secondary | ICD-10-CM

## 2014-11-18 DIAGNOSIS — F331 Major depressive disorder, recurrent, moderate: Secondary | ICD-10-CM

## 2014-11-18 DIAGNOSIS — G479 Sleep disorder, unspecified: Secondary | ICD-10-CM

## 2014-11-18 DIAGNOSIS — F1020 Alcohol dependence, uncomplicated: Secondary | ICD-10-CM | POA: Diagnosis not present

## 2014-11-18 MED ORDER — MIRTAZAPINE 15 MG PO TABS: 15.0000 mg | ORAL_TABLET | Freq: Every day | ORAL | Status: DC

## 2014-11-18 MED ORDER — QUETIAPINE FUMARATE 50 MG PO TABS: ORAL_TABLET | ORAL | Status: DC

## 2014-11-18 NOTE — Addendum Note (Signed)
Addended by: Margit Banda D on: 11/18/2014 12:06 PM   Modules accepted: Level of Service

## 2014-11-18 NOTE — Progress Notes (Signed)
[facility]  Progress Note  [name] [id number] 49 y.o.  11/18/2014 11:13 AM  Chief Complaint: I'm depressed and very anxious  History of Present Illness: Patient seen for the first time by Dr. Rutherford Limerick, on an emergency basis. Patient sees Dr. Michae Kava on irregular basis. Patient came in because she has relapsed on her alcohol and has been having a few drinks. Because of insomnia she took 4 over-the-counter sleeping pills and her husband was very concerned and spoke with Charmian Muff in [facility] who asked him to bring the patient in. Patient appears quite depressed states that her last drink was 2 weeks ago but she is very anxious today her blood pressure was significantly elevated at 170/106 with a pulse of 72, it was checked subsequently manually and it was down to 154/98 with a pulse of 72. Patient states that she needs to go see her PCP her blood pressure has been fluctuating for the past 2 years. Discussed with the patient that she needed to make a list at different times of the day every day and bring it in after a week and then have her see her PCP she stated understanding. Patient states she was not trying to kill herself but simply wanted to sleep so took 4 over-the-counter sleeping pills. Reports that she has become more depressed [place] the Prozac is not helping her, her husband has stage IV cancer and she states that his cancer is stable he continues to work full-time and does not talk about it and has not seen a Veterinary surgeon. Her children are angry at her, no one is talking about dad's cancer and the possible impending demise. Patient states that her sleep is poor she has hot very disturbed at night weeks up 3-4 times and is tired during the day, appetite is also poor she and supper eating maybe 2 meals a day, no weight changes noted. Mood is depressed anxious she ruminates about everything, no OCD symptoms. Dense to feel hopeless and helpless, denies suicidal or homicidal ideation  and has no hallucinations or delusions. She does not smoke cigarettes or use marijuana and her last drink was 2 weeks ago. Denies any cravings at the present time. She states that her drinking has gotten worse in the last 2 years. Patient has been to [facility] with an evidence and after her relapse went to [facility]. I discussed discontinuing the Prozac and also discussed the rationale risks benefits options of Remeron for her depression and obtained informed consent. Will continue her Seroquel at this time. Discussed various books that she might be interested in and encouraged her to read and also watch a couple, [employer] movies. Patient has not been doing any of her hobbies which include cooking and playing tennis and reading.        Suicidal Ideation: No . Plan Formed: No Patient has means to carry out plan: No  Homicidal Ideation: No Plan Formed: No Patient has means to carry out plan: No  Review of Systems: Psychiatric: Agitation: No Hallucination: No Depressed Mood: Yes Insomnia: Yes Hypersomnia: No Altered Concentration: No Feels Worthless: Yes Grandiose Ideas: No Belief In Special Powers: No New/Increased Substance Abuse: No Compulsions: No  Neurologic: Headache: No Seizure: No Paresthesias: No   Review of Systems  Constitutional: Negative for fever, chills and weight loss.  HENT: Negative for congestion, ear pain, nosebleeds and sore throat.   Eyes: Negative for blurred vision, double vision, photophobia and pain.  Respiratory: Negative  for cough, sputum production and shortness of breath.   Cardiovascular: Negative for chest pain, palpitations and leg swelling.  Gastrointestinal: Negative for heartburn, nausea, vomiting, abdominal pain and constipation.  Genitourinary: Negative for dysuria, urgency, hematuria and flank pain.  Musculoskeletal: Positive for back pain and neck pain. Negative for joint pain and falls.  Skin: Negative for itching and rash.   Neurological: Positive for headaches. Negative for dizziness, sensory change, seizures and loss of consciousness.  Endo/Heme/Allergies: Positive for environmental allergies. Negative for polydipsia. Does not bruise/bleed easily.  Psychiatric/Behavioral: Positive for depression and substance abuse. Negative for suicidal ideas and hallucinations. The patient is nervous/anxious and has insomnia.      Past Medical Family, Social History: living in [facility] with husband and 2 kids, 1 dog and Press photographer of Birth: [place]. 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 She has never used smokeless tobacco. She reports that she drinks alcohol. She reports that she does not use illicit drugs.  Family History  Problem Relation Age of Onset  . Cancer Mother 22    breast  . Diabetes Maternal Grandmother   . Heart disease Maternal Grandmother   . Stroke Maternal Grandmother   . Early death Neg Hx   . Alcohol abuse Sister   . Alcohol abuse Paternal Grandmother    Past Medical History  Diagnosis Date  . Depression   . Allergy   . Alcohol dependence     Outpatient Encounter Prescriptions as of 11/18/2014  Medication Sig  . acamprosate (CAMPRAL) 333 MG tablet Take 1 tablet (333 mg total) by mouth 3 (three) times daily.  . Fish Oil OIL 3 tablets by Does not apply route.  Marland Kitchen FLUoxetine (PROZAC) 20 MG capsule Take 3 capsules (60 mg total) by mouth daily.  . Multiple Vitamin (MULTIVITAMIN) capsule Take 1 capsule by mouth daily.  Marland Kitchen omeprazole (PRILOSEC) 20 MG capsule Take 20 mg by mouth daily.  . QUEtiapine (SEROQUEL) 100 MG tablet TAKE 1 TABLET (100 MG TOTAL) BY MOUTH AT BEDTIME.   No facility-administered  encounter medications on file as of 11/18/2014.    Past Psychiatric History/Hospitalization(s): Anxiety: Yes Bipolar Disorder: No Depression: Yes Mania: No Psychosis: No Schizophrenia: No Personality Disorder: No Hospitalization for psychiatric illness: No History of Electroconvulsive Shock Therapy: No Prior Suicide Attempts: No  Physical Exam: Constitutional:  BP 154/98 mmHg  Pulse 72  Ht 5\' 2"  (1.575 m)  Wt 193 lb (87.544 kg)  BMI 35.29 kg/m2  General Appearance: alert, oriented, no acute distress  Musculoskeletal: Strength & Muscle Tone: within normal limits Gait & Station: normal Patient leans: N/A  Mental Status Examination/Evaluation: Objective: Attitude initially guarded, then did settle down.   Appearance: Casual, appears to be stated age, patient was very anxious   Eye Contact::  Fair  Speech:  Clear and Coherent and Normal Rate  Volume:  Normal  Mood:  depressed and anxious   Affect:  Depressed and Tearful  Thought Process:  Goal Directed  Orientation:  Full (Time, Place, and Person)  Thought Content:  WDL   Suicidal Thoughts:  No  Homicidal Thoughts:  No  Judgement:  Fair  Insight:  Fair  Concentration: good  Memory: Immediate-fair Recent-fair Remote-fair  Recall: fair  Language: fair  Gait and Station: normal  [employer] of Knowledge: average  Psychomotor Activity:  Normal  Akathisia:  No  Handed:  Right  AIMS (if indicated):  Facial and Oral Movements  Muscles of Facial Expression: None, normal  Lips and Perioral Area: None, normal  Jaw: None, normal  Tongue: None, normal Extremity Movements: Upper (arms, wrists, hands, fingers): None, normal  Lower (legs, knees, ankles, toes): None, normal,  Trunk Movements:  Neck, shoulders, hips: None, normal,  Overall Severity : Severity of abnormal movements (highest score from questions above): None, normal  Incapacitation due to abnormal movements: None, normal  [name] awareness of abnormal  movements (rate only patient's report): No Awareness, Dental Status  Current problems with teeth and/or dentures?: No  Does patient usually wear dentures?: No    Assets:  Communication Skills Desire for Improvement Financial Resources/Insurance Housing Resilience Social Support TEFL teacher (Choose Three): Review of Psycho-Social Stressors (1), Review or order clinical lab tests (1), Established Problem, Worsening (2), Review of Medication Regimen & Side Effects (2) and Review of New Medication or Change in Dosage (2)  Assessment: AXIS I MDD- recurrent, moderate; Alcohol dependence with recent relapse, generalized anxiety disorder, sleep disorder   AXIS II Deferred  AXIS III Past Medical History  Diagnosis Date  . Depression   . Allergy   . Alcohol dependence      AXIS IV other psychosocial or environmental problems  AXIS V 51-60 moderate symptoms   Treatment Plan/Recommendations:  Plan of Care: Medication management with supportive therapy. Risks/benefits and SE of the medication discussed. Pt verbalized understanding and verbal consent obtained for treatment. Affirm with the patient that the medications are taken as ordered. Patient expressed understanding of how their medications were to be used.     Laboratory:reviewed labs from [facility] in Jan 2016- UA neg, CBC WNL, BMP WNL, Chol 222, Trig 190, LDL 129; Hepatitis panel neg, Hepatic panel WNL, TSH WNL, HIV nonreactive; UDS + benzos and alcohol  Psychotherapy: Therapy: brief supportive therapy provided. Discussed psychosocial stressors in detail.   Encouraged and validated sobriety   Medications: Start Remeron 15 mg by mouth daily at bedtime to treat her depression and anxiety.  DC Prozac  Divide Seroquel 50 mg po q a.m. and HS for depression, anxiety and sleep DC Campral  po TID for alcohol cravings Patient will check her blood  pressure at multiple times during the day for a week and will bring in the readings.   - Pt has elevated lipid panel and is on Fish oil. PCP is monitoring and treating her cholesterol issues. We will restart Seroquel and continue to monitor her lipid panel.   Routine PRN Medications: Yes  Consultations: encouraged to continue individual therapy - advised to monitor BP at home and f/up with PCP  Safety Concerns: Pt denies SI and is at an acute low risk for suicide.Patient told to call clinic if any problems occur. Patient advised to go to ER if they should develop SI/HI, side effects, or if symptoms worsen. Has crisis numbers to call if needed. Pt verbalized understanding.   Other: F/up in 1 week or sooner if needed          Margit Banda, MD 11/18/2014

## 2014-11-23 ENCOUNTER — Other Ambulatory Visit (HOSPITAL_COMMUNITY): Payer: Self-pay | Admitting: Psychiatry

## 2014-11-25 ENCOUNTER — Ambulatory Visit (INDEPENDENT_AMBULATORY_CARE_PROVIDER_SITE_OTHER): Payer: BLUE CROSS/BLUE SHIELD | Admitting: Psychiatry

## 2014-11-25 ENCOUNTER — Encounter (HOSPITAL_COMMUNITY): Payer: Self-pay | Admitting: Psychiatry

## 2014-11-25 VITALS — BP 150/88 | HR 98 | Ht 62.0 in | Wt 202.2 lb

## 2014-11-25 DIAGNOSIS — F1020 Alcohol dependence, uncomplicated: Secondary | ICD-10-CM

## 2014-11-25 DIAGNOSIS — F411 Generalized anxiety disorder: Secondary | ICD-10-CM | POA: Diagnosis not present

## 2014-11-25 DIAGNOSIS — F331 Major depressive disorder, recurrent, moderate: Secondary | ICD-10-CM

## 2014-11-25 DIAGNOSIS — G479 Sleep disorder, unspecified: Secondary | ICD-10-CM | POA: Diagnosis not present

## 2014-11-25 DIAGNOSIS — F1010 Alcohol abuse, uncomplicated: Secondary | ICD-10-CM

## 2014-11-25 NOTE — Progress Notes (Signed)
[facility] Behavioral Health  Progress Note  [name] [id number] 49 y.o.  11/25/2014 2:03 PM  Chief Complaint: I feel a little better  History of Present Illness: Patient seen today for follow-up states that since starting the Remeron she feels more calmer and has been able to sleep through the night. States she does have severe dreams discussed the side effects of the medications. Reports that her crying spells have decreased significantly, her energy has been better. Reports that her appetite is also improving.   States that her sister visited her with her children and she had good quality family time with her sister and her family. She continues to state that the 47 year old son does not talk to her and this weekend she her husband in the 35 year old son are going to [place] to visit. The 47 year old does not want to come with them and he continues to reside with his grandmother which is very difficult for the patient. Patient what he is about her 47 year old since he was close to his grandfather who died in 59 and now his father is sick with cancer.   Patient is started seeing her therapist Saunders Glance, and states that her husband did come along for therapy session. Discussed the family going through grief therapy and talking about dad's cancer and including the 40 year old she stated understanding. Encourage patient to take pictures and start a scrap book and form memories. Blood pressure continues to be elevated and patient has taken for readings every day for the past week, was referred to her PCP for anti-hypertensive medications. PCP is Dr.[email or web address].  Patient denies using alcohol, denies suicidal or homicidal ideation and reports no hallucinations or delusions. Encouraged patient to continue relaxation reading books which she has started doing, and watching comment a movies.  Encouraged her to pursue her hobbies of cooking and playing tennis. She stated understanding      Suicidal  Ideation: No . Plan Formed: No Patient has means to carry out plan: No  Homicidal Ideation: No Plan Formed: No Patient has means to carry out plan: No  Review of Systems: Psychiatric: Agitation: No Hallucination: No Depressed Mood: Yes Insomnia: Yes Hypersomnia: No Altered Concentration: No Feels Worthless: Yes Grandiose Ideas: No Belief In Special Powers: No New/Increased Substance Abuse: No Compulsions: No  Neurologic: Headache: No Seizure: No Paresthesias: No   Review of Systems  Constitutional: Negative for fever, chills and weight loss.  HENT: Negative for congestion, ear pain, nosebleeds and sore throat.   Eyes: Negative for blurred vision, double vision, photophobia and pain.  Respiratory: Negative for cough, sputum production and shortness of breath.   Cardiovascular: Negative for chest pain, palpitations and leg swelling.  Gastrointestinal: Negative for heartburn, nausea, vomiting, abdominal pain and constipation.  Genitourinary: Negative for dysuria, urgency, hematuria and flank pain.  Musculoskeletal: Positive for back pain and neck pain. Negative for joint pain and falls.  Skin: Negative for itching and rash.  Neurological: Positive for headaches. Negative for dizziness, sensory change, seizures and loss of consciousness.  Endo/Heme/Allergies: Positive for environmental allergies. Negative for polydipsia. Does not bruise/bleed easily.  Psychiatric/Behavioral: Positive for depression and substance abuse. Negative for suicidal ideas and hallucinations. The patient is nervous/anxious and has insomnia.    and   Past Medical Family, Social History: living in [facility] [place] [facility] with husband and 2 kids, 1 dog and Press photographer of Birth: [place]. 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 She has never used smokeless tobacco. She reports that she drinks alcohol. She reports that she does not use illicit drugs.  Family History  Problem Relation Age of Onset  . Cancer Mother 53    breast  . Diabetes Maternal Grandmother   . Heart disease Maternal Grandmother   . Stroke Maternal Grandmother   . Early death Neg Hx   . Alcohol abuse Sister   . Alcohol abuse Paternal Grandmother    Past Medical History  Diagnosis Date  . Depression   . Allergy   . Alcohol dependence     Outpatient Encounter Prescriptions as of 11/25/2014  Medication Sig  . Fish Oil OIL 3 tablets by Does not apply route.  . mirtazapine (REMERON) 15 MG tablet Take 1 tablet (15 mg total) by mouth at bedtime.  . Multiple Vitamin (MULTIVITAMIN) capsule Take 1 capsule by mouth daily.  Marland Kitchen omeprazole (PRILOSEC) 20 MG capsule Take 20 mg by mouth daily.  . QUEtiapine (SEROQUEL) 50 MG tablet TAKE 1 TABLET 50 MG AM AND PM   No facility-administered encounter medications on file as of 11/25/2014.    Past Psychiatric History/Hospitalization(s): Anxiety: Yes Bipolar Disorder: No Depression: Yes Mania: No Psychosis: No Schizophrenia: No Personality Disorder: No Hospitalization for psychiatric illness: No History of Electroconvulsive Shock Therapy: No Prior Suicide Attempts: No  Physical Exam: Constitutional:  There were no vitals taken for this visit.  General Appearance: alert, oriented, no acute distress  Musculoskeletal: Strength & Muscle Tone: within normal limits Gait & Station: normal Patient leans: N/A  Mental Status Examination/Evaluation: Objective: Attitude initially guarded, then did settle down.   Appearance: Casual, appears to be stated age, patient was very anxious   Eye Contact::  Fair  Speech:  Clear and Coherent and Normal Rate  Volume:  Normal  Mood:  depressed and anxious   Affect:   Depressed and Tearful  Thought Process:  Goal Directed  Orientation:  Full (Time, Place, and Person)  Thought Content:  WDL   Suicidal Thoughts:  No  Homicidal Thoughts:  No  Judgement:  Fair  Insight:  Fair  Concentration: good  Memory: Immediate-fair Recent-fair Remote-fair  Recall: fair  Language: fair  Gait and Station: normal  [employer] of Knowledge: average  Psychomotor Activity:  Normal  Akathisia:  No  Handed:  Right  AIMS (if indicated):  Facial and Oral Movements  Muscles of Facial Expression: None, normal  Lips and Perioral Area: None, normal  Jaw: None, normal  Tongue: None, normal Extremity Movements: Upper (arms, wrists, hands, fingers): None, normal  Lower (legs, knees, ankles, toes): None, normal,  Trunk Movements:  Neck, shoulders, hips: None, normal,  Overall Severity : Severity of abnormal movements (highest score from questions above): None, normal  Incapacitation due to abnormal movements: None, normal  [name] awareness of abnormal movements (rate only patient's report): No Awareness, Dental Status  Current problems with teeth and/or dentures?: No  Does patient usually wear dentures?: No    Assets:  Communication Skills Desire for Improvement Financial Resources/Insurance Housing Resilience Social Support TEFL teacher (Choose Three): Review of Psycho-Social Stressors (1), Review or order clinical lab tests (1), Established Problem, Worsening (2), Review of Medication Regimen & Side Effects (2) and Review of  New Medication or Change in Dosage (2)  Assessment: AXIS I MDD- recurrent, moderate; Alcohol dependence with recent relapse, generalized anxiety disorder, sleep disorder   AXIS II Deferred  AXIS III Past Medical History  Diagnosis Date  . Depression   . Allergy   . Alcohol dependence      AXIS IV other psychosocial or environmental problems  AXIS V 51-60  moderate symptoms   Treatment Plan/Recommendations:  Plan of Care: Medication management with supportive therapy. Risks/benefits and SE of the medication discussed. Pt verbalized understanding and verbal consent obtained for treatment. Affirm with the patient that the medications are taken as ordered. Patient expressed understanding of how their medications were to be used.     Laboratory:reviewed labs from [facility] in Jan 2016- UA neg, CBC WNL, BMP WNL, Chol 222, Trig 190, LDL 129; Hepatitis panel neg, Hepatic panel WNL, TSH WNL, HIV nonreactive; UDS + benzos and alcohol  Psychotherapy: Therapy: brief supportive therapy provided. Discussed psychosocial stressors in detail.   Encouraged and validated sobriety   Medications: Continue Remeron 15 mg by mouth daily at bedtime to treat her depression and anxiety.    continue Seroquel 50 mg po q a.m. and HS for depression, anxiety and sleep  Patient will go see her PCP for blood pressure medication  .   Routine PRN Medications: Yes  Consultations: encouraged to continue individual therapywith Saunders Glance - advised to monitor BP at home and f/up with PCP  Safety Concerns: Pt denies SI and is at an acute low risk for suicide.Patient told to call clinic if any problems occur. Patient advised to go to ER if they should develop SI/HI, side effects, or if symptoms worsen. Has crisis numbers to call if needed. Pt verbalized understanding.   Other: F/up in 1 month or sooner if needed

## 2014-12-29 ENCOUNTER — Ambulatory Visit (INDEPENDENT_AMBULATORY_CARE_PROVIDER_SITE_OTHER): Payer: BLUE CROSS/BLUE SHIELD | Admitting: Psychiatry

## 2014-12-29 ENCOUNTER — Encounter (HOSPITAL_COMMUNITY): Payer: Self-pay | Admitting: Psychiatry

## 2014-12-29 VITALS — BP 124/74 | HR 116 | Ht 62.0 in | Wt 207.6 lb

## 2014-12-29 DIAGNOSIS — F331 Major depressive disorder, recurrent, moderate: Secondary | ICD-10-CM | POA: Diagnosis not present

## 2014-12-29 DIAGNOSIS — F1010 Alcohol abuse, uncomplicated: Secondary | ICD-10-CM

## 2014-12-29 DIAGNOSIS — F1020 Alcohol dependence, uncomplicated: Secondary | ICD-10-CM | POA: Diagnosis not present

## 2014-12-29 DIAGNOSIS — F321 Major depressive disorder, single episode, moderate: Secondary | ICD-10-CM

## 2014-12-29 DIAGNOSIS — F411 Generalized anxiety disorder: Secondary | ICD-10-CM

## 2014-12-29 DIAGNOSIS — G479 Sleep disorder, unspecified: Secondary | ICD-10-CM | POA: Diagnosis not present

## 2014-12-29 NOTE — Progress Notes (Signed)
[facility] Behavioral Health  Progress Note  CARLISS PORCARO [id number] 49 y.o.  12/29/2014 2:09 PM  Chief Complaint: I feel  exhausted  History of Present Illness: Patient seen today for follow up, states that she had a good visit to [place] with her husband and her younger son. States that her 97 year old son continues to be very angry and oppositional. He had spent the summer living with his paternal grandmother and has now returned home and continues to be very rude and oppositional difficulty following through with directions.  Patient gave examples of how rude he is and became extremely tearful stating that her husband does not support her when her son is disrespectful. Discussed dividing the chores between her and her husband she felt that would be an option. Also grieving the loss ofhis father last year Discussed with her how children grieve differently and the various stages of grief.  Patient states that her husband has stage IV cancer but is fairly stable at this point in time, he has been working and does not want to talk and nobody talks about how things are. Everyone at home pretense that there is nothing wrong. Discussed with her the need for everyone to come together and talk about her husband's cancer. Patient states that he may live for a few more years with the new medications that the giving him. Encouraged her to bring her husband in to discuss grief and start grief therapy and involved other family members she stated understanding and is willing to do that.  Patient states that her sleep is better, appetite is good mood is more irritable and anxious lately has been having headaches. Blood pressure is fine denies feeling hopeless or helpless denies suicidal or homicidal ideation no hallucinations or delusions. Patient states she has not used alcohol. She has reconnected with a friend and attends a meetings along with her. Overall his coping significantly better. Patient was put  on lisinopril 25 mg for hypertension by her PCP Dr. Arma Heading. She has been reading and is enjoying that.     Suicidal Ideation: No . Plan Formed: No Patient has means to carry out plan: No  Homicidal Ideation: No Plan Formed: No Patient has means to carry out plan: No  Review of Systems: Psychiatric: Agitation: No Hallucination: No Depressed Mood: Yes Insomnia: Yes Hypersomnia: No Altered Concentration: No Feels Worthless: Yes Grandiose Ideas: No Belief In Special Powers: No New/Increased Substance Abuse: No Compulsions: No  Neurologic: Headache: No Seizure: No Paresthesias: No   Review of Systems  Constitutional: Negative for fever, chills and weight loss.  HENT: Negative for congestion, ear pain, nosebleeds and sore throat.   Eyes: Negative for blurred vision, double vision, photophobia and pain.  Respiratory: Negative for cough, sputum production and shortness of breath.   Cardiovascular: Negative for chest pain, palpitations and leg swelling.  Gastrointestinal: Negative for heartburn, nausea, vomiting, abdominal pain and constipation.  Genitourinary: Negative for dysuria, urgency, hematuria and flank pain.  Musculoskeletal: Positive for back pain and neck pain. Negative for joint pain and falls.  Skin: Negative for itching and rash.  Neurological: Positive for headaches. Negative for dizziness, sensory change, seizures and loss of consciousness.  Endo/Heme/Allergies: Positive for environmental allergies. Negative for polydipsia. Does not bruise/bleed easily.  Psychiatric/Behavioral: Positive for depression and substance abuse. Negative for suicidal ideas and hallucinations. The patient is nervous/anxious and has insomnia.    and   Past Medical Family, Social History: living in [facility] with husband and 2  kids, 1 dog and cat Place of Birth: [place]. 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 She has never used smokeless tobacco. She reports that she drinks alcohol. She reports that she does not use illicit drugs.  Family History  Problem Relation Age of Onset  . Cancer Mother 27    breast  . Diabetes Maternal Grandmother   . Heart disease Maternal Grandmother   . Stroke Maternal Grandmother   . Early death Neg Hx   . Alcohol abuse Sister   . Alcohol abuse Paternal Grandmother    Past Medical History  Diagnosis Date  . Depression   . Allergy   . Alcohol dependence     Outpatient Encounter Prescriptions as of 12/29/2014  Medication Sig  . Fish Oil OIL 3 tablets by Does not apply route.  . mirtazapine (REMERON) 15 MG tablet Take 1 tablet (15 mg total) by mouth at bedtime.  . Multiple Vitamin (MULTIVITAMIN) capsule Take 1 capsule by mouth daily.  Marland Kitchen omeprazole (PRILOSEC) 20 MG capsule Take 20 mg by mouth daily.  . QUEtiapine (SEROQUEL) 50 MG tablet TAKE 1 TABLET 50 MG AM AND PM   No facility-administered encounter medications on file as of 12/29/2014.    Past Psychiatric History/Hospitalization(s): Anxiety: Yes Bipolar Disorder: No Depression: Yes Mania: No Psychosis: No Schizophrenia: No Personality Disorder: No Hospitalization for psychiatric illness: No History of Electroconvulsive Shock Therapy: No Prior Suicide Attempts: No  Physical Exam: Constitutional:  BP 124/74 mmHg  Pulse 116  Ht 5\' 2"  (1.575 m)  Wt 207 lb 9.6 oz (94.167 kg)  BMI 37.96 kg/m2  General Appearance: alert, oriented, no acute distress  Musculoskeletal: Strength & Muscle Tone: within normal limits Gait & Station: normal Patient leans: N/A  Mental Status Examination/Evaluation: Objective: Attitude initially guarded, then did settle down.   Appearance: Casual, appears  to be stated age, patient was very anxious   Eye Contact::  Fair  Speech:  Clear and Coherent and Normal Rate  Volume:  Normal  Mood:  depressed and anxious   Affect:  Depressed and Tearful  Thought Process:  Goal Directed  Orientation:  Full (Time, Place, and Person)  Thought Content:  WDL   Suicidal Thoughts:  No  Homicidal Thoughts:  No  Judgement:  Fair  Insight:  Fair  Concentration: good  Memory: Immediate-fair Recent-fair Remote-fair  Recall: fair  Language: fair  Gait and Station: normal  [employer] of Knowledge: average  Psychomotor Activity:  Normal  Akathisia:  No  Handed:  Right  AIMS (if indicated):  Facial and Oral Movements  Muscles of Facial Expression: None, normal  Lips and Perioral Area: None, normal  Jaw: None, normal  Tongue: None, normal Extremity Movements: Upper (arms, wrists, hands, fingers): None, normal  Lower (legs, knees, ankles, toes): None, normal,  Trunk Movements:  Neck, shoulders, hips: None, normal,  Overall Severity : Severity of abnormal movements (highest score from questions above): None, normal  Incapacitation due to abnormal movements: None, normal  [name] awareness of abnormal movements (rate only patient's report): No Awareness, Dental Status  Current problems with teeth and/or dentures?: No  Does patient usually wear dentures?: No    Assets:  Communication Skills Desire for Improvement Financial Resources/Insurance  Housing Resilience Social Support TEFL teacher (Choose Three): Review of Psycho-Social Stressors (1), Review or order clinical lab tests (1), Established Problem, Worsening (2), Review of Medication Regimen & Side Effects (2) and Review of New Medication or Change in Dosage (2)  Assessment: AXIS I MDD- recurrent, moderate; Alcohol dependence with recent relapse, generalized anxiety disorder, sleep disorder   AXIS II Deferred  AXIS III Past Medical  History  Diagnosis Date  . Depression   . Allergy   . Alcohol dependence      AXIS IV other psychosocial or environmental problems  AXIS V 51-60 moderate symptoms   Treatment Plan/Recommendations:  Plan of Care: Medication management with supportive therapy. Risks/benefits and SE of the medication discussed. Pt verbalized understanding and verbal consent obtained for treatment. Affirm with the patient that the medications are taken as ordered. Patient expressed understanding of how their medications were to be used.     Laboratory:reviewed labs from [facility] in Jan 2016- UA neg, CBC WNL, BMP WNL, Chol 222, Trig 190, LDL 129; Hepatitis panel neg, Hepatic panel WNL, TSH WNL, HIV nonreactive; UDS + benzos and alcohol  Psychotherapy: Therapy: brief supportive therapy provided. Discussed psychosocial stressors in detail.   Encouraged and validated sobriety   Medications: Continue Remeron 15 mg by mouth daily at bedtime to treat her depression and anxiety.    continue Seroquel 50 mg po q a.m. and HS for depression, anxiety and sleep  Continue lisinopril 25 mg by mouth daily  .   Routine PRN Medications: Yes  Consultations: encouraged to continue individual therapywith Saunders Glance - advised to monitor BP at home and f/up with PCP  Safety Concerns: Pt denies SI and is at an acute low risk for suicide.Patient told to call clinic if any problems occur. Patient advised to go to ER if they should develop SI/HI, side effects, or if symptoms worsen. Has crisis numbers to call if needed. Pt verbalized understanding.   Other: F/up in 1 week, along with her husband to do grief therapy. or sooner if needed

## 2015-01-06 ENCOUNTER — Ambulatory Visit (HOSPITAL_COMMUNITY): Payer: Self-pay | Admitting: Psychiatry

## 2015-02-04 ENCOUNTER — Other Ambulatory Visit (HOSPITAL_COMMUNITY): Payer: Self-pay | Admitting: Psychiatry

## 2015-02-09 ENCOUNTER — Telehealth (HOSPITAL_COMMUNITY): Payer: Self-pay

## 2015-02-09 NOTE — Telephone Encounter (Signed)
Medication refill request - Telephone call with Tobi Bastosnna, pharmacist at [facility] on [address] to follow up on request from them for Seroquel and Remeron refills needed. Informed new orders were e-scribed 11/18/14+2 refills and pt. returns 02/17/15.  Verified patient's recent pick up dates as she filled both Remeron and Seroquel on 11/18/14, 12/13/14 and then again on 01/08/15 so each time was 5-6 days early.  Left a message for patient to call this nurse back to question if she had enough medications to last until appointment set for 02/17/15 and if not to follow up on concern patient possibly taking more than prescribed each day.

## 2015-02-10 ENCOUNTER — Other Ambulatory Visit (HOSPITAL_COMMUNITY): Payer: Self-pay | Admitting: Psychiatry

## 2015-02-10 DIAGNOSIS — F331 Major depressive disorder, recurrent, moderate: Secondary | ICD-10-CM

## 2015-02-10 MED ORDER — MIRTAZAPINE 15 MG PO TABS: 15.0000 mg | ORAL_TABLET | Freq: Every day | ORAL | Status: DC

## 2015-02-10 MED ORDER — QUETIAPINE FUMARATE 50 MG PO TABS: ORAL_TABLET | ORAL | Status: DC

## 2015-02-10 NOTE — Telephone Encounter (Signed)
She has an appt on 10/20 so I have refilled 7 tabs.

## 2015-02-10 NOTE — Telephone Encounter (Signed)
Patient has missed appointments with me. I will had scheduled her to bring her husband into talk about his cancer because she ruminates about it and her children are angry at her but she did not keep the appointment and has not called either for a follow-up appointment. This is a patient of Dr. Lemar LoftyAgarwal's and she'll be followed by Dr. Michae KavaAgarwal

## 2015-02-16 ENCOUNTER — Other Ambulatory Visit (HOSPITAL_COMMUNITY): Payer: Self-pay | Admitting: Psychiatry

## 2015-02-17 ENCOUNTER — Ambulatory Visit (HOSPITAL_COMMUNITY): Payer: Self-pay | Admitting: Psychiatry

## 2015-02-22 ENCOUNTER — Telehealth (HOSPITAL_COMMUNITY): Payer: Self-pay

## 2015-02-22 NOTE — Telephone Encounter (Signed)
No she needs to be seen to get refills.

## 2015-02-22 NOTE — Telephone Encounter (Signed)
Medication management - Left patient a messaage of need to call and reschedule as no showed for her appointment on 02/17/15 after a 7 day supply was sent for Seroquel and Remeron refills that date.  Requested pt. call back to discuss.

## 2015-04-07 ENCOUNTER — Encounter (HOSPITAL_COMMUNITY): Payer: Self-pay | Admitting: Psychiatry

## 2015-04-07 ENCOUNTER — Ambulatory Visit (INDEPENDENT_AMBULATORY_CARE_PROVIDER_SITE_OTHER): Payer: BLUE CROSS/BLUE SHIELD | Admitting: Psychiatry

## 2015-04-07 VITALS — BP 158/108 | HR 106 | Ht 62.0 in | Wt 202.0 lb

## 2015-04-07 DIAGNOSIS — F1021 Alcohol dependence, in remission: Secondary | ICD-10-CM | POA: Diagnosis not present

## 2015-04-07 DIAGNOSIS — G4700 Insomnia, unspecified: Secondary | ICD-10-CM

## 2015-04-07 DIAGNOSIS — F331 Major depressive disorder, recurrent, moderate: Secondary | ICD-10-CM | POA: Diagnosis not present

## 2015-04-07 MED ORDER — MIRTAZAPINE 15 MG PO TABS: 15.0000 mg | ORAL_TABLET | Freq: Every day | ORAL | Status: DC

## 2015-04-07 MED ORDER — QUETIAPINE FUMARATE 50 MG PO TABS: ORAL_TABLET | ORAL | Status: DC

## 2015-04-07 NOTE — Progress Notes (Signed)
Patient ID: [name], female   DOB: 11-20-1965, 49 y.o.   MRN: [id number]  [facility] [id number] Progress Note  AALAYA [name] [id number] 49 y.o.  04/07/2015 4:16 PM  Chief Complaint: Prozac is not working  History of Present Illness: Pt is not drinking alcohol. She denies cravings. She goes to AA 3-4x/week. Pt has a sponsor.   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 Things are good at home and with her kids.    Pt is sleeping about 6 hrs/night. Energy is ok.  Appetite and concentration are ok.   Pt denies depression. Denies anhedonia, isolation, crying spells, low motivation, poor hygiene, worthlessness and hopelessness. Denies SI/HI.  Anxiety is present but tolerable. She gets anxious a couple of time times a month.   Taking Remeron and Seroquel as prescribed and denies SE.   Suicidal Ideation: No. Plan Formed: No Patient has means to carry out plan: No  Homicidal Ideation: No Plan Formed: No Patient has means to carry out plan: No  Review of Systems: Psychiatric: Agitation: No Hallucination: No Depressed Mood: No Insomnia: No Hypersomnia: No Altered Concentration: No Feels Worthless: No Grandiose Ideas: No Belief In Special Powers: No New/Increased Substance Abuse: No Compulsions: No  Neurologic: Headache: No Seizure: No Paresthesias: No   Review of Systems  Constitutional: Negative for fever, chills and weight loss.  HENT: Negative for congestion, ear pain, nosebleeds and sore throat.   Eyes: Negative for blurred vision, double vision and pain.  Respiratory: Negative for cough, sputum production and shortness of breath.   Cardiovascular: Negative for chest pain, palpitations and leg swelling.  Gastrointestinal: Negative for heartburn, nausea, vomiting and abdominal pain.  Musculoskeletal: Negative for back pain, joint pain and neck pain.  Skin: Negative for itching and rash.  Neurological: Negative for dizziness, sensory change, seizures, loss of  consciousness and headaches.  Psychiatric/Behavioral: Negative for depression, suicidal ideas, hallucinations and substance abuse. The patient is nervous/anxious. The patient does not have insomnia.      Past Medical Family, Social History: living in [facility] [place] with husband and 2 kids, 1 dog and Press photographer of Birth: [place]. 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 She has never used smokeless tobacco. She reports that she drinks alcohol. She reports that she does not use illicit drugs.  Family History  Problem Relation Age of Onset  . Cancer Mother 57    breast  . Diabetes Maternal Grandmother   . Heart disease Maternal Grandmother   . Stroke Maternal Grandmother   . Early death Neg Hx   . Alcohol abuse Sister   . Alcohol abuse Paternal Grandmother    Past Medical History  Diagnosis Date  . Depression   . Allergy   . Alcohol dependence (HCC)   . HTN (hypertension)     Outpatient Encounter Prescriptions as of 04/07/2015  Medication Sig  . Fish Oil OIL 3 tablets by Does not apply route.  . hydrochlorothiazide (HYDRODIURIL) 25 MG tablet Take 12.5 mg by mouth daily.  . mirtazapine (REMERON) 15 MG tablet Take 1 tablet (15 mg total) by mouth at bedtime.  . Multiple Vitamin (MULTIVITAMIN) capsule Take 1 capsule by mouth daily.  Marland Kitchen omeprazole (PRILOSEC)  20 MG capsule Take 20 mg by mouth daily.  . QUEtiapine (SEROQUEL) 50 MG tablet TAKE 1 TABLET 50 MG AM AND PM   No facility-administered encounter medications on file as of 04/07/2015.    Past Psychiatric History/Hospitalization(s): Anxiety: Yes Bipolar Disorder: No Depression: Yes Mania: No Psychosis: No Schizophrenia: No Personality  Disorder: No Hospitalization for psychiatric illness: No History of Electroconvulsive Shock Therapy: No Prior Suicide Attempts: No  Physical Exam: Constitutional:  BP 158/108 mmHg  Pulse 106  Ht 5\' 2"  (1.575 m)  Wt 202 lb (91.627 kg)  BMI 36.94 kg/m2 Pt is working with her PCP regarding her PBP.    General Appearance: alert, oriented, no acute distress  Musculoskeletal: Strength & Muscle Tone: within normal limits Gait & Station: normal Patient leans: N/A  Mental Status Examination/Evaluation: Objective: Attitude: Calm and cooperative  Appearance: Casual, appears to be stated age  Eye Contact::  Fair  Speech:  Clear and Coherent and Normal Rate  Volume:  Normal  Mood:  euthymic  Affect:  Congruent  Thought Process:  Goal Directed  Orientation:  Full (Time, Place, and Person)  Thought Content:  Negative  Suicidal Thoughts:  No  Homicidal Thoughts:  No  Judgement:  Fair  Insight:  Fair  Concentration: good  Memory: Immediate-fair Recent-fair Remote-fair  Recall: fair  Language: fair  Gait and Station: normal  [employer] of Knowledge: average  Psychomotor Activity:  Normal  Akathisia:  No  Handed:  Right  AIMS (if indicated):  Facial and Oral Movements  Muscles of Facial Expression: None, normal  Lips and Perioral Area: None, normal  Jaw: None, normal  Tongue: None, normal Extremity Movements: Upper (arms, wrists, hands, fingers): None, normal  Lower (legs, knees, ankles, toes): None, normal,  Trunk Movements:  Neck, shoulders, hips: None, normal,  Overall Severity : Severity of abnormal movements (highest score from questions above): None, normal  Incapacitation due to abnormal movements: None, normal  [name] awareness of abnormal movements (rate only patient's report): No Awareness, Dental Status  Current problems with teeth and/or dentures?: No  Does patient usually wear dentures?: No    Assets:  Communication Skills Desire for  Improvement Financial Resources/Insurance Housing Resilience Social Support TEFL teacherTalents/Skills Transportation       Medical Decision Making (Choose Three): Established Problem, Stable/Improving (1), Review of Psycho-Social Stressors (1), Review or order clinical lab tests (1) and Review of Medication Regimen & Side Effects (2)  Assessment: AXIS I MDD- recurrent, moderate; Alcohol dependence in early remission, Insomnia  AXIS II Deferred  AXIS III Past Medical History  Diagnosis Date  . Depression   . Allergy   . Alcohol dependence      AXIS IV other psychosocial or environmental problems  AXIS V 51-60 moderate symptoms   Treatment Plan/Recommendations:  Plan of Care: Medication management with supportive therapy. Risks/benefits and SE of the medication discussed. Pt verbalized understanding and verbal consent obtained for treatment. Affirm with the patient that the medications are taken as ordered. Patient expressed understanding of how their medications were to be used.     Laboratory:reviewed labs from [facility] in Jan 2016- UA neg, CBC WNL, BMP WNL, Chol 222, Trig 190, LDL 129; Hepatitis panel neg, Hepatic panel WNL, TSH WNL, HIV nonreactive; UDS + benzos and alcohol  Pt will be getting labs and EKG soon  Psychotherapy: Therapy: brief supportive therapy provided. Discussed psychosocial stressors in detail.   Encouraged and validated sobriety   Medications:  Remeron 15mg  po qHS for mood, anxiety  and sleep Seroquel  po BID for depression, anxiety and sleep     Routine PRN Medications: Yes  Consultations:  - advised to monitor BP at home and f/up with PCP  Safety Concerns: Pt denies SI and is at an acute low risk for suicide.Patient told to call clinic if any problems occur. Patient advised to go to ER if they should develop SI/HI, side effects, or if symptoms worsen. Has crisis numbers to call if needed. Pt verbalized understanding.    Other: F/up in 3 months or sooner if needed          Oletta Darter, MD 04/07/2015

## 2015-06-25 ENCOUNTER — Other Ambulatory Visit (HOSPITAL_COMMUNITY): Payer: Self-pay | Admitting: Psychiatry

## 2015-07-07 ENCOUNTER — Encounter (HOSPITAL_COMMUNITY): Payer: Self-pay | Admitting: Psychiatry

## 2015-07-07 ENCOUNTER — Ambulatory Visit (INDEPENDENT_AMBULATORY_CARE_PROVIDER_SITE_OTHER): Payer: BLUE CROSS/BLUE SHIELD | Admitting: Psychiatry

## 2015-07-07 VITALS — BP 142/100 | HR 108 | Ht 62.0 in | Wt 195.8 lb

## 2015-07-07 DIAGNOSIS — F1021 Alcohol dependence, in remission: Secondary | ICD-10-CM | POA: Diagnosis not present

## 2015-07-07 DIAGNOSIS — G4700 Insomnia, unspecified: Secondary | ICD-10-CM

## 2015-07-07 DIAGNOSIS — F331 Major depressive disorder, recurrent, moderate: Secondary | ICD-10-CM | POA: Diagnosis not present

## 2015-07-07 MED ORDER — QUETIAPINE FUMARATE 50 MG PO TABS: ORAL_TABLET | ORAL | Status: DC

## 2015-07-07 MED ORDER — MIRTAZAPINE 15 MG PO TABS: 15.0000 mg | ORAL_TABLET | Freq: Every day | ORAL | Status: DC

## 2015-07-07 NOTE — Progress Notes (Signed)
[facility] [id number] Progress Note  Melinda ShipSarah B Avery [id number] 50 y.o.  07/07/2015 4:18 PM  Chief Complaint: pretty good  History of Present Illness: Pt is not drinking alcohol. She denies cravings. She goes to AA 3-4x/week. Pt has a sponsor.   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 Things are good at home and with her kids and husband.    Pt is sleeping about 6 hrs/night. Energy is ok.  Appetite and concentration are ok.   Pt denies depression. Denies anhedonia, isolation, crying spells, low motivation, poor hygiene, worthlessness and hopelessness. Denies SI/HI.  Anxiety is present but tolerable. She gets anxious a couple of time times a month. Pt uses relaxation to help with that.   Taking Remeron and Seroquel as prescribed and denies SE.   Suicidal Ideation: No. Plan Formed: No Patient has means to carry out plan: No  Homicidal Ideation: No Plan Formed: No Patient has means to carry out plan: No  Review of Systems: Psychiatric: Agitation: No Hallucination: No Depressed Mood: No Insomnia: No Hypersomnia: No Altered Concentration: No Feels Worthless: No Grandiose Ideas: No Belief In Special Powers: No New/Increased Substance Abuse: No Compulsions: No  Neurologic: Headache: No Seizure: No Paresthesias: No   Review of Systems  Constitutional: Negative for fever, chills and weight loss.  HENT: Negative for congestion, ear pain, nosebleeds and sore throat.   Eyes: Negative for blurred vision, double vision and pain.  Respiratory: Negative for cough, sputum production and shortness of breath.   Cardiovascular: Negative for chest pain, palpitations and leg swelling.  Gastrointestinal: Negative for heartburn, nausea, vomiting and abdominal pain.  Musculoskeletal: Negative for back pain, joint pain and neck pain.  Skin: Negative for itching and rash.  Neurological: Negative for dizziness, sensory change, seizures, loss of consciousness and headaches.   Psychiatric/Behavioral: Negative for depression, suicidal ideas, hallucinations and substance abuse. The patient is nervous/anxious. The patient does not have insomnia.      Past Medical Family, Social History: [place] with husband and 2 kids, 1 dog and Press photographercat [place]. 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 She has never used smokeless tobacco. She reports that she drinks alcohol. She reports that she does not use illicit drugs.  Family History  Problem Relation Age of Onset  . Cancer Mother 2655    breast  . Diabetes Maternal Grandmother   . Heart disease Maternal Grandmother   . Stroke Maternal Grandmother   . Early death Neg Hx   . Alcohol abuse Sister   . Alcohol abuse Paternal Grandmother    Past Medical History  Diagnosis Date  . Depression   . Allergy   . Alcohol dependence (HCC)   . HTN (hypertension)     Outpatient Encounter Prescriptions as of 07/07/2015  Medication Sig  . Fish Oil OIL 3 tablets by Does not apply route.  Marland Kitchen. lisinopril (PRINIVIL,ZESTRIL) 5 MG tablet Take 5 mg by mouth daily.  . mirtazapine (REMERON) 15 MG tablet Take 1 tablet (15 mg total) by mouth at bedtime.  . Multiple Vitamin (MULTIVITAMIN) capsule Take 1 capsule by mouth daily.  Marland Kitchen. omeprazole (PRILOSEC) 20 MG capsule Take 20 mg by mouth daily.  .Marland Kitchen  QUEtiapine (SEROQUEL) 50 MG tablet TAKE 1 TABLET 50 MG AM AND PM  . [DISCONTINUED] hydrochlorothiazide (HYDRODIURIL) 25 MG tablet Take 12.5 mg by mouth daily.   No facility-administered encounter medications on file as of 07/07/2015.    Past Psychiatric History/Hospitalization(s): Anxiety: Yes Bipolar Disorder: No Depression: Yes Mania:  No Psychosis: No Schizophrenia: No Personality Disorder: No Hospitalization for psychiatric illness: No History of Electroconvulsive Shock Therapy: No Prior Suicide Attempts: No  Physical Exam: Constitutional:  BP 142/100 mmHg  Pulse 108  Ht  (1.575 m)  Wt 195 lb 12.8 oz (88.814 kg)  BMI 35.80 kg/m2 Pt is working with her PCP regarding her PBP.    General Appearance: alert, oriented, no acute distress  Musculoskeletal: Strength & Muscle Tone: within normal limits Gait & Station: normal Patient leans: straight  Mental Status Examination/Evaluation: Objective: Attitude: Calm and cooperative  Appearance: Casual, appears to be stated age  Eye Contact::  Fair  Speech:  Clear and Coherent and Normal Rate  Volume:  Normal  Mood:  euthymic  Affect:  Congruent  Thought Process:  Goal Directed  Orientation:  Full (Time, Place, and Person)  Thought Content:  Negative  Suicidal Thoughts:  No  Homicidal Thoughts:  No  Judgement:  Fair  Insight:  Fair  Concentration: good  Memory: Immediate-fair Recent-fair Remote-fair  Recall: fair  Language: fair  Gait and Station: normal  [employer] of Knowledge: average  Psychomotor Activity:  Normal  Akathisia:  No  Handed:  Right  AIMS (if indicated):  Facial and Oral Movements  Muscles of Facial Expression: None, normal  Lips and Perioral Area: None, normal  Jaw: None, normal  Tongue: None, normal Extremity Movements: Upper (arms, wrists, hands, fingers): None, normal  Lower (legs, knees, ankles, toes): None, normal,  Trunk Movements:  Neck, shoulders, hips: None, normal,  Overall Severity : Severity of abnormal movements (highest score from questions above): None, normal  Incapacitation due to abnormal movements: None, normal  [name] awareness of abnormal movements (rate only patient's report): No Awareness, Dental Status  Current problems with teeth and/or dentures?: No  Does patient usually wear dentures?: No     Assets:  Communication Skills Desire for Improvement Financial Resources/Insurance Housing Resilience Social Support TEFL teacher (Choose Three): Established Problem, Stable/Improving (1), Review of Psycho-Social Stressors (1) and Review of Medication Regimen & Side Effects (2)  Assessment: AXIS I MDD- recurrent, moderate; Alcohol dependence in early remission, Insomnia  AXIS II Deferred   Treatment Plan/Recommendations:  Plan of Care: Medication management with supportive therapy. Risks/benefits and SE of the medication discussed. Pt verbalized understanding and verbal consent obtained for treatment. Affirm with the patient that the medications are taken as ordered. Patient expressed understanding of how their medications were to be used.     Laboratory:reviewed labs from [facility] in Jan 2016- UA neg, CBC WNL, BMP WNL, Chol 222, Trig 190, LDL 129; Hepatitis panel neg, Hepatic panel WNL, TSH WNL, HIV nonreactive; UDS + benzos and alcohol  Pt will be getting labs and EKG soon  Psychotherapy: Therapy: brief supportive therapy provided. Discussed psychosocial stressors in detail.   Encouraged and validated sobriety   Medications:  Remeron  po qHS for mood, anxiety and sleep Seroquel  po BID for depression, anxiety and sleep     Routine PRN Medications: Yes  Consultations:  - advised to monitor BP at home and f/up with PCP  Safety Concerns: Pt denies SI and is  at an acute low risk for suicide.Patient told to call clinic if any problems occur. Patient advised to go to ER if they should develop SI/HI, side effects, or if symptoms worsen. Has crisis numbers to call if needed. Pt verbalized understanding.   Other: F/up in 3 months or sooner if needed          Oletta Darter, MD 07/07/2015

## 2015-09-29 ENCOUNTER — Other Ambulatory Visit (HOSPITAL_COMMUNITY): Payer: Self-pay | Admitting: Psychiatry

## 2015-10-05 ENCOUNTER — Other Ambulatory Visit (HOSPITAL_COMMUNITY): Payer: Self-pay | Admitting: Psychiatry

## 2015-10-11 ENCOUNTER — Ambulatory Visit (HOSPITAL_COMMUNITY): Payer: Self-pay | Admitting: Psychiatry

## 2015-11-24 ENCOUNTER — Encounter (HOSPITAL_COMMUNITY): Payer: Self-pay | Admitting: Emergency Medicine

## 2015-11-24 ENCOUNTER — Emergency Department (HOSPITAL_COMMUNITY)
Admission: EM | Admit: 2015-11-24 | Discharge: 2015-11-25 | Disposition: A | Discharge status: Other Acute Inpatient Hospital | Payer: BLUE CROSS/BLUE SHIELD | Attending: Emergency Medicine | Admitting: Emergency Medicine

## 2015-11-24 DIAGNOSIS — F1010 Alcohol abuse, uncomplicated: Secondary | ICD-10-CM | POA: Insufficient documentation

## 2015-11-24 DIAGNOSIS — Z791 Long term (current) use of non-steroidal anti-inflammatories (NSAID): Secondary | ICD-10-CM | POA: Diagnosis not present

## 2015-11-24 DIAGNOSIS — Z79899 Other long term (current) drug therapy: Secondary | ICD-10-CM | POA: Diagnosis not present

## 2015-11-24 DIAGNOSIS — F419 Anxiety disorder, unspecified: Secondary | ICD-10-CM | POA: Diagnosis present

## 2015-11-24 DIAGNOSIS — I10 Essential (primary) hypertension: Secondary | ICD-10-CM | POA: Insufficient documentation

## 2015-11-24 LAB — CBC
HCT: 35.7 % — ABNORMAL LOW (ref 36.0–46.0)
Hemoglobin: 12 g/dL (ref 12.0–15.0)
MCH: 31.7 pg (ref 26.0–34.0)
MCHC: 33.6 g/dL (ref 30.0–36.0)
MCV: 94.4 fL (ref 78.0–100.0)
PLATELETS: 282 10*3/uL (ref 150–400)
RBC: 3.78 MIL/uL — ABNORMAL LOW (ref 3.87–5.11)
RDW: 12.8 % (ref 11.5–15.5)
WBC: 9.8 10*3/uL (ref 4.0–10.5)

## 2015-11-24 LAB — COMPREHENSIVE METABOLIC PANEL
ALBUMIN: 4 g/dL (ref 3.5–5.0)
ALK PHOS: 93 U/L (ref 38–126)
ALT: 61 U/L — AB (ref 14–54)
ANION GAP: 11 (ref 5–15)
AST: 91 U/L — AB (ref 15–41)
BILIRUBIN TOTAL: 0.9 mg/dL (ref 0.3–1.2)
BUN: 14 mg/dL (ref 6–20)
CALCIUM: 9.3 mg/dL (ref 8.9–10.3)
CO2: 21 mmol/L — ABNORMAL LOW (ref 22–32)
CREATININE: 0.71 mg/dL (ref 0.44–1.00)
Chloride: 106 mmol/L (ref 101–111)
GFR calc Af Amer: >60 mL/min (ref >60)
GFR calc non Af Amer: >60 mL/min (ref >60)
GLUCOSE: 90 mg/dL (ref 65–99)
Potassium: 3.8 mmol/L (ref 3.5–5.1)
Sodium: 138 mmol/L (ref 135–145)
TOTAL PROTEIN: 7.5 g/dL (ref 6.5–8.1)

## 2015-11-24 LAB — ETHANOL: Alcohol, Ethyl (B): 122 mg/dL — ABNORMAL HIGH (ref <5)

## 2015-11-24 NOTE — ED Notes (Signed)
MD at bedside. 

## 2015-11-24 NOTE — ED Provider Notes (Signed)
[facility]-EMERGENCY DEPT Provider Note   CSN: [id number] Arrival date & time: 11/24/15  2056  By signing my name below, I, [name], attest that this documentation has been prepared under the direction and in the presence of Melinda Ohlin, MD. Electronically Signed: Angelene Giovanni, ED Scribe. 11/25/15. 12:03 AM.   History   Chief Complaint Chief Complaint  Patient presents with  . Alcohol Problem  . Anxiety    HPI Comments: [name] is a 50 y.o. female with a hx of alcohol dependence who presents to the Emergency Department with family requesting ETOH detox and counseling for panic attacks. Family explains that pt has been drinking ETOH and mixing it with medications such as Valium and her husband's Xanax recently. Pt states that she drinks approx 8 oz of ETOH daily. She adds that her drinking is related to her episodes of panic attacks although pt was not able to clarify what happens during those episodes. She reports that she does not want to be admitted inpatient for help. She denies any visual/auditory hallucinations or SI/HI. No fever, chills, abdominal pain, or n/v/d. Pt has upcoming appointment with an outpatient mental health facility on 12/09/15. No other complaints at this time.   PCP: Dr. Winona Legato   The history is provided by the patient. No language interpreter was used.  Alcohol Problem  This is a new problem. The problem has not changed since onset.Pertinent negatives include no abdominal pain. Nothing aggravates the symptoms. Nothing relieves the symptoms. She has tried nothing for the symptoms. The treatment provided no relief.    Past Medical History:  Diagnosis Date  . Alcohol dependence (HCC)   . Allergy   . Depression   . HTN (hypertension)     Patient Active Problem List   Diagnosis Date Noted  . Insomnia 04/07/2015  . Alcohol use disorder, severe, in sustained remission (HCC) 04/07/2015  . Major depressive disorder, recurrent episode, moderate (HCC)  04/07/2015  . Alcohol abuse 11/18/2014  . Alcohol use disorder, severe, in early remission, dependence (HCC) 08/24/2014  . Alcohol dependence in remission (HCC) 06/24/2014  . MDD (major depressive disorder), recurrent episode, moderate (HCC) 06/24/2014  . Substance induced mood disorder (HCC) 11/20/2013  . Sleep disorder 11/20/2013  . Alcohol dependence with uncomplicated withdrawal (HCC) 11/17/2013  . Depression 09/29/2013  . Generalized anxiety disorder 02/06/2013    Past Surgical History:  Procedure Laterality Date  . CESAREAN SECTION      OB History    No data available       Home Medications    Prior to Admission medications   Medication Sig Start Date End Date Taking? Authorizing Provider  ALPRAZolam Prudy Feeler) 1 MG tablet Take 0.5-1 mg by mouth 3 (three) times daily as needed for anxiety.   Yes Historical Provider, MD  busPIRone (BUSPAR) 7.5 MG tablet Take 7.5 mg by mouth 3 (three) times daily.   Yes Historical Provider, MD  FLUoxetine (PROZAC) 20 MG capsule Take 60 mg by mouth daily.   Yes Historical Provider, MD  ibuprofen (ADVIL,MOTRIN) 200 MG tablet Take 400 mg by mouth every 6 (six) hours as needed for moderate pain.   Yes Historical Provider, MD  lisinopril (PRINIVIL,ZESTRIL) 10 MG tablet Take 10 mg by mouth daily.   Yes Historical Provider, MD  losartan (COZAAR) 25 MG tablet Take 25 mg by mouth daily.   Yes Historical Provider, MD  mirtazapine (REMERON) 15 MG tablet TAKE 1 TABLET (15 MG TOTAL) BY MOUTH AT BEDTIME. 09/29/15  Yes  Oletta Darter, MD  QUEtiapine (SEROQUEL) 50 MG tablet TAKE 1 TABLET EVERY MORNING AND TAKE 1 TABLET IN THE EVENING Patient taking differently: TAKE 1 TABLET EVERY MORNING 09/29/15  Yes Oletta Darter, MD  sertraline (ZOLOFT) 50 MG tablet Take 50 mg by mouth daily.   Yes Historical Provider, MD    Family History Family History  Problem Relation Age of Onset  . Cancer Mother 36    breast  . Diabetes Maternal Grandmother   . Heart disease  Maternal Grandmother   . Stroke Maternal Grandmother   . Alcohol abuse Sister   . Alcohol abuse Paternal Grandmother   . Early death Neg Hx     Social History Social History  Substance Use Topics  . Smoking status: Never Smoker  . Smokeless tobacco: Never Used  . Alcohol use 0.0 oz/week     Comment: quit Apr 29, 2014 and went [facility] 2016     Allergies   Erythromycin   Review of Systems Review of Systems  Constitutional: Negative for fever.  Gastrointestinal: Negative for abdominal pain, diarrhea, nausea and vomiting.  Psychiatric/Behavioral: Negative for hallucinations, self-injury and suicidal ideas. The patient is nervous/anxious.   All other systems reviewed and are negative.    Physical Exam Updated Vital Signs BP 115/78   Pulse 95   Temp 98.2 F (36.8 C) (Oral)   Resp 20   Ht 5\' 2"  (1.575 m)   Wt 180 lb (81.6 kg)   LMP 11/13/2015   SpO2 94%   BMI 32.92 kg/m   Physical Exam  Constitutional: She is oriented to person, place, and time. She appears well-developed and well-nourished. No distress.  HENT:  Head: Normocephalic and atraumatic.  Nose: Nose normal.  Mouth/Throat: Oropharynx is clear and moist.  Eyes: Conjunctivae and EOM are normal. Pupils are equal, round, and reactive to light.  Neck: Normal range of motion. Neck supple. No tracheal deviation present.  Cardiovascular: Normal rate and regular rhythm.   Pulmonary/Chest: Effort normal and breath sounds normal. No respiratory distress.  Lungs clear  Abdominal: Soft. Bowel sounds are normal. There is no rebound and no guarding.  Musculoskeletal: Normal range of motion.  Intact distal pulses  Neurological: She is alert and oriented to person, place, and time. She has normal reflexes.  Skin: Skin is warm and dry.  Psychiatric: She has a normal mood and affect. Her behavior is normal.  Nursing note and vitals reviewed.    ED Treatments / Results  DIAGNOSTIC STUDIES: Oxygen  Saturation is 97% on RA, normal by my interpretation.    COORDINATION OF CARE: 12:00 AM- Pt advised of plan for treatment and pt agrees. Pt informed of her lab results. She will be evaluated by TSS for further treatment.   Labs (all labs ordered are listed, but only abnormal results are displayed) Labs Reviewed  COMPREHENSIVE METABOLIC PANEL - Abnormal; Notable for the following:       Result Value   CO2 21 (*)    AST 91 (*)    ALT 61 (*)    All other components within normal limits  ETHANOL - Abnormal; Notable for the following:    Alcohol, Ethyl (B) 122 (*)    All other components within normal limits  CBC - Abnormal; Notable for the following:    RBC 3.78 (*)    HCT 35.7 (*)    All other components within normal limits  URINE RAPID DRUG SCREEN, HOSP PERFORMED    EKG  EKG Interpretation None       Radiology No results found.  Procedures Procedures (including critical care time)  Medications Ordered in ED Medications - No data to display   Initial Impression / Assessment and Plan / ED Course  Terressa Evola, MD has reviewed the triage vital signs and the nursing notes.  Pertinent labs & imaging results that were available during my care of the patient were reviewed by me and considered in my medical decision making (see chart for details).  Clinical Course    Per Ala Dach, 23 hour obs at [facility]  Final Clinical Impressions(s) / ED Diagnoses   Final diagnoses:  None  Alcohol abuse  New Prescriptions New Prescriptions   No medications on file   Vitals:   11/25/15 0000 11/25/15 0102  BP: 120/84 113/86  Pulse: 89 89  Resp:  16  Temp:  97.7 F (36.5 C)   Results for orders placed or performed during the hospital encounter of 11/24/15  Comprehensive metabolic panel  Result Value Ref Range   Sodium 138 135 - 145 mmol/L   Potassium 3.8 3.5 - 5.1 mmol/L   Chloride 106 101 - 111 mmol/L   CO2 21 (L) 22 - 32 mmol/L   Glucose, Bld 90 65 - 99 mg/dL   BUN 14 6 -  20 mg/dL   Creatinine, Ser 1.61 0.44 - 1.00 mg/dL   Calcium 9.3 8.9 - 09.6 mg/dL   Total Protein 7.5 6.5 - 8.1 g/dL   Albumin 4.0 3.5 - 5.0 g/dL   AST 91 (H) 15 - 41 U/L   ALT 61 (H) 14 - 54 U/L   Alkaline Phosphatase 93 38 - 126 U/L   Total Bilirubin 0.9 0.3 - 1.2 mg/dL   GFR calc non Af Amer >60 >60 mL/min   GFR calc Af Amer >60 >60 mL/min   Anion gap 11 5 - 15  Ethanol  Result Value Ref Range   Alcohol, Ethyl (B) 122 (H) <5 mg/dL  cbc  Result Value Ref Range   WBC 9.8 4.0 - 10.5 K/uL   RBC 3.78 (L) 3.87 - 5.11 MIL/uL   Hemoglobin 12.0 12.0 - 15.0 g/dL   HCT 04.5 (L) 40.9 - 81.1 %   MCV 94.4 78.0 - 100.0 fL   MCH 31.7 26.0 - 34.0 pg   MCHC 33.6 30.0 - 36.0 g/dL   RDW 91.4 78.2 - 95.6 %   Platelets 282 150 - 400 K/uL  Rapid urine drug screen (hospital performed)  Result Value Ref Range   Opiates NONE DETECTED NONE DETECTED   Cocaine NONE DETECTED NONE DETECTED   Benzodiazepines POSITIVE (A) NONE DETECTED   Amphetamines NONE DETECTED NONE DETECTED   Tetrahydrocannabinol NONE DETECTED NONE DETECTED   Barbiturates NONE DETECTED NONE DETECTED   No results found. Medications - No data to display  To [facility], patient has agreed  I personally performed the services described in this documentation, which was scribed in my presence. The recorded information has been reviewed and is accurate.      Cy Blamer, MD 11/25/15 [phone]

## 2015-11-24 NOTE — ED Notes (Signed)
Pt ambulated to restroom. 

## 2015-11-24 NOTE — ED Triage Notes (Signed)
Pt brought in by her family with c/o alcohol abuse and anxiety  Family states pt has been abusing alcohol and misusing her medication  Pt states she averages 2 8oz vodka drinks per day and has been using her valium and her husbands xanax as needed for anxiety  Pt states she has been having episodes where she shakes uncontrollably  Pt states her and her husband recently separated and her oldest son is currently living with a neighbor   States she also has another son that is 66 years old

## 2015-11-25 ENCOUNTER — Observation Stay (HOSPITAL_COMMUNITY)
Admission: EM | Admit: 2015-11-25 | Discharge: 2015-11-25 | Disposition: A | Discharge status: Home / Self Care | Payer: BLUE CROSS/BLUE SHIELD | Source: Intra-hospital | Attending: Psychiatry | Admitting: Psychiatry

## 2015-11-25 ENCOUNTER — Encounter (HOSPITAL_COMMUNITY): Payer: Self-pay

## 2015-11-25 ENCOUNTER — Encounter (HOSPITAL_COMMUNITY): Payer: Self-pay | Admitting: Emergency Medicine

## 2015-11-25 DIAGNOSIS — G4700 Insomnia, unspecified: Secondary | ICD-10-CM | POA: Insufficient documentation

## 2015-11-25 DIAGNOSIS — F1024 Alcohol dependence with alcohol-induced mood disorder: Principal | ICD-10-CM | POA: Insufficient documentation

## 2015-11-25 DIAGNOSIS — F410 Panic disorder [episodic paroxysmal anxiety] without agoraphobia: Secondary | ICD-10-CM | POA: Diagnosis present

## 2015-11-25 DIAGNOSIS — I10 Essential (primary) hypertension: Secondary | ICD-10-CM | POA: Diagnosis not present

## 2015-11-25 DIAGNOSIS — Y906 Blood alcohol level of 120-199 mg/100 ml: Secondary | ICD-10-CM | POA: Diagnosis not present

## 2015-11-25 DIAGNOSIS — F1020 Alcohol dependence, uncomplicated: Secondary | ICD-10-CM | POA: Diagnosis not present

## 2015-11-25 DIAGNOSIS — Z881 Allergy status to other antibiotic agents status: Secondary | ICD-10-CM | POA: Insufficient documentation

## 2015-11-25 DIAGNOSIS — F331 Major depressive disorder, recurrent, moderate: Secondary | ICD-10-CM | POA: Diagnosis not present

## 2015-11-25 DIAGNOSIS — F411 Generalized anxiety disorder: Secondary | ICD-10-CM | POA: Insufficient documentation

## 2015-11-25 DIAGNOSIS — Z6379 Other stressful life events affecting family and household: Secondary | ICD-10-CM | POA: Diagnosis not present

## 2015-11-25 LAB — RAPID URINE DRUG SCREEN, HOSP PERFORMED
Amphetamines: NOT DETECTED
Barbiturates: NOT DETECTED
Benzodiazepines: POSITIVE — AB
Cocaine: NOT DETECTED
Opiates: NOT DETECTED
Tetrahydrocannabinol: NOT DETECTED

## 2015-11-25 MED ORDER — BUSPIRONE HCL 7.5 MG PO TABS: 7.5000 mg | ORAL_TABLET | Freq: Three times a day (TID) | ORAL | Status: DC

## 2015-11-25 MED ORDER — LOSARTAN POTASSIUM 25 MG PO TABS: 25.0000 mg | ORAL_TABLET | Freq: Every day | ORAL | Status: DC

## 2015-11-25 MED ORDER — SERTRALINE HCL 50 MG PO TABS
50.0000 mg | ORAL_TABLET | Freq: Every day | ORAL | Status: DC
  Administered 2015-11-25: 50 mg via ORAL
  Filled 2015-11-25: qty 1

## 2015-11-25 MED ORDER — FLUOXETINE HCL 20 MG PO CAPS
60.0000 mg | ORAL_CAPSULE | Freq: Every day | ORAL | Status: DC
  Administered 2015-11-25: 60 mg via ORAL
  Filled 2015-11-25: qty 3

## 2015-11-25 MED ORDER — LISINOPRIL 10 MG PO TABS: 10.0000 mg | ORAL_TABLET | Freq: Every day | ORAL | Status: DC

## 2015-11-25 MED ORDER — FLUOXETINE HCL 20 MG PO CAPS: 60.0000 mg | ORAL_CAPSULE | Freq: Every day | ORAL | 3 refills | Status: DC

## 2015-11-25 MED ORDER — LISINOPRIL 10 MG PO TABS
10.0000 mg | ORAL_TABLET | Freq: Every day | ORAL | Status: DC
  Administered 2015-11-25: 10 mg via ORAL
  Filled 2015-11-25: qty 1

## 2015-11-25 MED ORDER — HYDROXYZINE HCL 25 MG PO TABS: 25.0000 mg | ORAL_TABLET | Freq: Four times a day (QID) | ORAL | Status: DC | PRN

## 2015-11-25 MED ORDER — QUETIAPINE FUMARATE 50 MG PO TABS: 50.0000 mg | ORAL_TABLET | Freq: Every morning | ORAL | Status: DC

## 2015-11-25 MED ORDER — LOSARTAN POTASSIUM 25 MG PO TABS
25.0000 mg | ORAL_TABLET | Freq: Every day | ORAL | Status: DC
  Administered 2015-11-25: 25 mg via ORAL
  Filled 2015-11-25: qty 1

## 2015-11-25 MED ORDER — MIRTAZAPINE 15 MG PO TABS: 15.0000 mg | ORAL_TABLET | Freq: Every day | ORAL | 0 refills | Status: DC

## 2015-11-25 MED ORDER — MIRTAZAPINE 15 MG PO TABS: 15.0000 mg | ORAL_TABLET | Freq: Every day | ORAL | Status: DC

## 2015-11-25 MED ORDER — HYDROXYZINE HCL 25 MG PO TABS: 25.0000 mg | ORAL_TABLET | Freq: Four times a day (QID) | ORAL | 0 refills | Status: DC | PRN

## 2015-11-25 MED ORDER — QUETIAPINE FUMARATE 50 MG PO TABS
50.0000 mg | ORAL_TABLET | Freq: Every morning | ORAL | Status: DC
  Administered 2015-11-25: 50 mg via ORAL

## 2015-11-25 NOTE — Progress Notes (Signed)
Admission note:  [name] admitted to [facility] Obs unit bed #5.  ETOH induced Mood disorder and panic attacks are principal complaints. Pt is cooperative and answers most questions.  Pt sts she does not know why she is here. Pt brought to ER by husband and mother after pt consumed alcohol and some of her husbands Xanax.  Pt has hx of panic attacks and Depression.  Pt is almost daily drinker with Vodka as the alcohol of choice.  Prior to admit pt sts she consumed vodka almost daily and sts she drinks to calm down.  Pt is separated from husband who has stage 4 lung ca.  Pt currently denies any withdrawal symptoms and denies pain, SI, HI and AVH.  Pt previously a patient at [facility].  When asked what pts goals are, PT sts she really does not know why she is here and all she really wants is some sleep.  Pt given snacks and is currently sleeping.

## 2015-11-25 NOTE — BH Assessment (Addendum)
Tele Assessment Note   [name] is an 50 y.o. separated female who presents to [facility] ED accompanied by her husband, Nakita Santerre, who participated in assessment at Pt's request. Pt reports she was brought to the ED by her mother and husband because of Pt's alcohol and Xanax use today. Pt says she has experienced severe panic attacks daily since she and her husband separated one month ago. Pt has a history of alcohol abuse and anxiety. Pt says she has a prescription for valium but it wasn't effective at controlling her anxiety so two days ago she began taking 2 mg of her husband's Xanax daily. She reports drinking approximately eight ounces of vodka five days per week. Pt reports she becomes tremulous due to anxiety and is using alcohol and Xanax to try to calm down. Pt denies having alcohol withdrawal symptoms. Pt denies other substance use. Pt's blood alcohol level is 122 and urine drug screen is positive for benzodiazepines. Pt reports her mood has been anxious. She denies current suicidal ideation or history of suicide attempts. Pt's husband does not feel Pt is suicidal or intentionally trying to harm herself. Pt denies any history of self-injurious behavior. Pt denies current homicidal ideation or history of violence. Pt denies any history of psychotic symptoms.  Pt identifies her primary stressor as separation from husband. Pt's husband has been diagnosed with stage IV lung cancer. Pt says they have two sons. Her 17 year old is staying with a neighbor and her 63 year old is staying with her. Pt's husband is staying with his mother. Pt reports she has a good support system and identifies her husband, parents and siblings as supportive.  Pt is receiving medication management through PCP, Zoe Lan, until she is established at [facility]. Pt reports she has an appointment at [facility] 12/09/15. Pt has been in [facility] at [facility] in the past. She has also had  inpatient treatment at [facility] in January 2015. Pt says she attend Alcoholics Anonymous.  Pt is dressed in hospital scrubs, alert, oriented x4 with normal speech and normal motor behavior. Eye contact is good. Pt's mood is anxious and affect is congruent with mood. Thought process is coherent and relevant. There is no indication Pt is currently responding to internal stimuli or experiencing delusional thought content. Pt was cooperative throughout assessment.  Pt's husband feels Pt needs inpatient alcohol detox. He states for the past two days she has "been out of it" and is concerned that Pt has been driving. He does not believe Pt is intentionally trying to harm herself. Pt states she does not want inpatient treatment and would rather follow up with [facility] and her primary care physician.   Diagnosis: General Anxiety Disorder; Alcohol Use Disorder, [place]; Benzodiazepine Use Disorder, Mild  Past Medical History:  Past Medical History:  Diagnosis Date  . Alcohol dependence (HCC)   . Allergy   . Depression   . HTN (hypertension)     Past Surgical History:  Procedure Laterality Date  . CESAREAN SECTION      Family History:  Family History  Problem Relation Age of Onset  . Cancer Mother 18    breast  . Diabetes Maternal Grandmother   . Heart disease Maternal Grandmother   . Stroke Maternal Grandmother   . Alcohol abuse Sister   . Alcohol abuse Paternal Grandmother   . Early death Neg Hx     Social History:  reports that she has never smoked.  She has never used smokeless tobacco. She reports that she drinks alcohol. She reports that she does not use drugs.  Additional Social History:  Alcohol / Drug Use Pain Medications: Denies abuse Prescriptions: Denies abuse Over the Counter: Denies abuse Longest period of sobriety (when/how long): Three months Negative Consequences of Use: Personal relationships Substance #1 Name of Substance 1: Alcohol 1 - Age  of First Use: 19 1 - Amount (size/oz): 8 ounces of vodka 1 - Frequency: 5 times per week 1 - Duration: Ongoing 1 - Last Use / Amount: 11/24/15, 8 ounces Vodka Substance #2 Name of Substance 2: Xanax 2 - Age of First Use: 50 2 - Amount (size/oz): 2 mg  2 - Frequency: daily 2 - Duration: Two days 2 - Last Use / Amount: 11/24/15, 2 mg  CIWA: CIWA-Ar BP: 120/84 Pulse Rate: 89 COWS:    PATIENT STRENGTHS: (choose at least two) Ability for insight Average or above average intelligence Capable of independent living Communication skills Financial means General fund of knowledge Motivation for treatment/growth Physical Health Supportive family/friends Work skills  Allergies:  Allergies  Allergen Reactions  . Erythromycin     Stomach  cramps    Home Medications:  (Not in a hospital admission)  OB/GYN Status:  [name] last menstrual period was 11/13/2015.  General Assessment Data Location of Assessment: [facility] TTS Assessment: In system Is this a Tele or Face-to-Face Assessment?: Tele Assessment Is this an Initial Assessment or a Re-assessment for this encounter?: Initial Assessment Marital status: Separated Maiden name: Domingo Sep Is patient pregnant?: No Pregnancy Status: No Living Arrangements: Children (Son (14)) Can pt return to current living arrangement?: Yes Admission Status: Voluntary Is patient capable of signing voluntary admission?: Yes Referral Source: Self/Family/Friend Insurance type: BCBS     Crisis Care Plan Living Arrangements: Children (Son (14)) Legal Guardian:  (Self) Name of Psychiatrist: None Name of Therapist: Has appointment with [facility] 12/09/15  Education Status Is patient currently in school?: No Current Grade: NA 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 : No Suicidal Intent: No Has patient had any suicidal intent within the past 6 months prior to admission? : No Is patient at risk for suicide?: No Suicidal Plan?: No Has patient had any suicidal plan within the past 6 months prior to admission? : No Access to Means: No What has been your use of drugs/alcohol within the last 12 months?: Pt is drinking and taking Xanax and valium Previous Attempts/Gestures: No How many times?: 0 Other Self Harm Risks: None Triggers for Past Attempts: None known Intentional Self Injurious Behavior: None Family Suicide History: No Recent stressful life event(s): Conflict (Comment) (Marital separation, husband has cancer) Persecutory voices/beliefs?: No Depression: Yes Depression Symptoms: Despondent, Guilt Substance abuse history and/or treatment for substance abuse?: Yes Suicide prevention information given to non-admitted patients: Not applicable  Risk to Others within the past 6 months Homicidal Ideation: No Does patient have any lifetime risk of violence toward others beyond the six months prior to admission? : No Thoughts of Harm to Others: No Current Homicidal Intent: No Current Homicidal Plan: No Access to Homicidal Means: No Identified Victim: None History of harm to others?: No Assessment of Violence: None Noted Violent Behavior Description: Pt denies history of violence Does patient have access to weapons?: No Criminal  Charges Pending?: No Does patient have a court date: No Is patient on probation?: No  Psychosis Hallucinations: None noted Delusions: None noted  Mental Status Report Appearance/Hygiene: In scrubs Eye Contact: Good Motor Activity: Unremarkable Speech: Logical/coherent Level of Consciousness: Alert, Other (Comment) (intoxicated) Mood: Anxious Affect: Anxious Anxiety Level: Panic Attacks Panic attack frequency: 1-2 times per day Most recent panic attack: 11/24/15 Thought Processes: Coherent,  Relevant Judgement: Partial Orientation: Person, Place, Time, Situation, Appropriate for developmental age Obsessive Compulsive Thoughts/Behaviors: None  Cognitive Functioning Concentration: Normal Memory: Recent Intact, Remote Intact IQ: Average Insight: Fair Impulse Control: Fair Appetite: Fair Weight Loss: 0 Weight Gain: 0 Sleep: No Change Total Hours of Sleep: 6 Vegetative Symptoms: None  ADLScreening [facility] Assessment Services) [name] cognitive ability adequate to safely complete daily activities?: Yes Patient able to express need for assistance with ADLs?: Yes Independently performs ADLs?: Yes (appropriate for developmental age)  Prior Inpatient Therapy Prior Inpatient Therapy: Yes Prior Therapy Dates: 2015 Prior [facility] Facilty[facility]Provider[facility]): [facility] Reason for Treatment: Alcohol use  Prior Outpatient Therapy Prior Outpatient Therapy: Yes Prior Therapy Dates: 2016 Prior Therapy Facilty/Provider(s): [facility] Reason for Treatment: Alcohol use Does patient have an ACCT team?: No Does patient have Intensive In-House Services?  : No Does patient have [facility] services? : No Does patient have P4CC services?: No  ADL Screening (condition at time of admission) [name] cognitive ability adequate to safely complete daily activities?: Yes Is the patient deaf or have difficulty hearing?: No Does the patient have difficulty seeing, even when wearing glasses/contacts?: No Does the patient have difficulty concentrating, remembering, or making decisions?: No Patient able to express need for assistance with ADLs?: Yes Does the patient have difficulty dressing or bathing?: No Independently performs ADLs?: Yes (appropriate for developmental age) Does the patient have difficulty walking or climbing stairs?: No Weakness of Legs: None Weakness of Arms/Hands: None       Abuse/Neglect Assessment (Assessment to be complete while patient is alone) Physical Abuse:  Denies Verbal Abuse: Denies Sexual Abuse: Denies Exploitation of patient/patient's resources: Denies Self-Neglect: Denies     Merchant navy officer (For Healthcare) Does patient have an advance directive?: No Would patient like information on creating an advanced directive?: No - patient declined information    Additional Information 1:1 In Past 12 Months?: No CIRT Risk: No Elopement Risk: No Does patient have medical clearance?: Yes     Disposition: Fransico Michael, AC at [facility], confirms bed availability. Gave clinical report to Alberteen Sam, NP who recommended Pt be admitted to Observation Unit at [facility]. Notified Dr. April Palumbo of recommendation.  Disposition Initial Assessment Completed for this Encounter: Yes Disposition of Patient: Other dispositions Other disposition(s): Other (Comment)   Pamalee Leyden, [facility], [facility] Triage Specialist [phone]   Patsy Baltimore, Harlin Rain 11/25/2015 12:58 AM

## 2015-11-25 NOTE — Discharge Summary (Signed)
            [facility]-Observation Unit Discharge Summary  [name] an 50 y.o.separated femalewho presents to [facility] ED accompanied by her husband late in the evening of 11/24/2015.  Patient reports she was brought to the [facility] by her mother and husband because of her alcohol and xanax use yesterday. Patient says she has experienced severe panic attacks daily since she and her husband separated one month ago. Patient has a history of alcohol abuse and anxiety. Patient says she has a prescription for valium but it wasn't effective at controlling her anxiety so two days ago she began taking 2 mg of her husband's Xanax daily. She reports drinking approximately eight ounces of vodka five days per week. Patient reports she becomes tremulous due to anxiety and is using alcohol and Xanax to try to calm down. Patient denies having alcohol withdrawal symptoms. Patient denies other substance use. Her blood alcohol level was 122 and urine drug screen is positive for benzodiazepines. Patient reports her mood has been anxious. She denies current suicidal ideation or history of suicide attempts. Patient denies any history of self-injurious behavior. Patient denies current homicidal ideation or history of violence. Patient denies any history of psychotic symptoms. She states during assessment "I need a counselor to talk with. I have a lot of stress related to my husband and him having cancer. I'm not even sure why he brought me here. I don't think it was necessary.  I know drinking and taking xanax is not good. I was just very upset. I feel better now. I have an appointment with my Primary Care MD at 3 pm today that I would like to make. I will be going to [facility] on 12/09/2015. They help people with substance abuse problems and with their marriage. That is the kind of help I feel that I need. I have also been going to AA some. I was in [facility] in January of 2015. I feel stable to go home. I live with  my son." Discussed the risk of consuming alcohol and medications such as xanax together to include rebound anxiety, respiratory depression and patient stated "I know." There were no signs and symptoms of withdrawal noted and her vitals signs are currently stable. Patient appeared future oriented during the assessment and very motivated to follow up outpatient. She was found stable to discharge home to follow up with outpatient treatment. Patient was picked up from the lobby by her husband on 11/25/2015.

## 2015-11-25 NOTE — ED Notes (Signed)
TTS consult at bedside.  

## 2015-11-25 NOTE — Progress Notes (Signed)
Artemisa was D/C from the unit to lobby accompanied by husband.  She was pleasant and cooperative. She voiced no SI/HI or A/V halluciations.  Pt. Denies any pain or discomfort.  She has some tremors in her hand and she states that "I'm just nervous."  D/C instructions and medications reviewed with pt.  She verbalized understanding of medications and d/c instructions.   All belongings from locker # 10 returned to pt. Q 15 min checks maintained until discharge.  She left the unit in no apparent distress. She stated that she is going to follow up with her provider today.  "I had an appointment at 3pm today and I am going to try to see him.  If not I have an appointment on 12/09/15."  Encouraged her to call or come back to [facility] if needed.

## 2015-11-25 NOTE — H&P (Signed)
[facility] Provider Admission PAA/H&P  Patient Identification: [name] MRN:  [id number] Date of Evaluation:  11/25/2015 Chief Complaint:  Patient states "I have been more anxious lately but I don't really think I needed a visit to the ED."  Principal Diagnosis: Alcohol use disorder, moderate, dependence ([place]) Diagnosis:   Patient Active Problem List   Diagnosis Date Noted  . Alcohol use disorder, moderate, dependence ([place]) [F10.20] 11/25/2015  . Insomnia [G47.00] 04/07/2015  . Alcohol use disorder, severe, in sustained remission ([place]) [F10.21] 04/07/2015  . Major depressive disorder, recurrent episode, moderate ([place]) [F33.1] 04/07/2015  . Alcohol abuse [F10.10] 11/18/2014  . Alcohol use disorder, severe, in early remission, dependence ([place]) [F10.21] 08/24/2014  . Alcohol dependence in remission ([place]) [F10.21] 06/24/2014  . MDD (major depressive disorder), recurrent episode, moderate ([place]) [F33.1] 06/24/2014  . Substance induced mood disorder ([place]) [F19.94] 11/20/2013  . Sleep disorder [G47.9] 11/20/2013  . Alcohol dependence with uncomplicated withdrawal ([place]) [F10.230] 11/17/2013  . Depression [F32.9] 09/29/2013  . Generalized anxiety disorder [F41.1] 02/06/2013   History of Present Illness:   [name] is an 50 y.o. separated female who presents to [facility] ED accompanied by her husband. Patient reports she was brought to the ED by her mother and husband because of Pt's alcohol and Xanax use today. Patient says she has experienced severe panic attacks daily since she and her husband separated one month ago. Patient has a history of alcohol abuse and anxiety. Patient says she has a prescription for valium but it wasn't effective at controlling her anxiety so two days ago she began taking 2 mg of her husband's Xanax daily. She reports drinking approximately eight ounces of vodka five days per week. Patient reports she becomes tremulous due to anxiety and is using alcohol  and Xanax to try to calm down. Patient denies having alcohol withdrawal symptoms. Patient denies other substance use. Her blood alcohol level was 122 and urine drug screen is positive for benzodiazepines. Patient reports her mood has been anxious. She denies current suicidal ideation or history of suicide attempts. Patient denies any history of self-injurious behavior. Patient denies current homicidal ideation or history of violence. Patient denies any history of psychotic symptoms. She states during assessment "I need a counselor to talk with. I have a lot of stress related to my husband and him having cancer. I'm not even sure why he brought me here. I don't think it was necessary.  I know drinking and taking xanax is not good. I was just very upset. I feel better now. I have an appointment with my Primary Care MD at 3 pm today that I would like to make. I will be going to [facility] on 12/09/2015. They help people with substance abuse problems and with their marriage. That is the kind of help I feel that I need. I have also been going to AA some. I was in [place] in January of 2015. I feel stable to go home. I live with my son." Discussed the risk of consuming alcohol and medications such as xanax together to include rebound anxiety, respiratory depression and patient stated "I know." There were no signs and symptoms of withdrawal noted and her vitals signs are currently stable. Patient appeared future oriented during the assessment and very motivated to follow up outpatient.   Associated Signs/Symptoms: Depression Symptoms:  depressed mood, difficulty concentrating, hopelessness, anxiety, panic attacks, disturbed sleep, (Hypo) Manic Symptoms:  Denies Anxiety Symptoms:  Panic Symptoms, Psychotic Symptoms:  Denies  PTSD Symptoms: Negative Total Time spent with patient: 30 minutes  Past Psychiatric History: Depression, GAD, Alcohol abuse   Is the patient at risk to self? No.   Has the patient been a risk to self in the past 6 months? No.  Has the patient been a risk to self within the distant past? No.  Is the patient a risk to others? No.  Has the patient been a risk to others in the past 6 months? No.  Has the patient been a risk to others within the distant past? No.   Prior Inpatient Therapy:   Prior Outpatient Therapy:    Alcohol Screening: 1. How often do you have a drink containing alcohol?: 4 or more times a week 2. How many drinks containing alcohol do you have on a typical day when you are drinking?: 3 or 4 3. How often do you have six or more drinks on one occasion?: Less than monthly Preliminary Score: 2 4. How often during the last year have you found that you were not able to stop drinking once you had started?: Less than monthly 5. How often during the last year have you failed to do what was normally expected from you becasue of drinking?: Less than monthly 6. How often during the last year have you needed a first drink in the morning to get yourself going after a heavy drinking session?: Never 7. How often during the last year have you had a feeling of guilt of remorse after drinking?: Daily or almost daily 8. How often during the last year have you been unable to remember what happened the night before because you had been drinking?: Less than monthly 9. Have you or someone else been injured as a result of your drinking?: No 10. Has a relative or friend or a doctor or another health worker been concerned about your drinking or suggested you cut down?: No Alcohol Use Disorder Identification Test Final Score (AUDIT): 13 Brief Intervention: Patient declined brief intervention Substance Abuse History in the last 12 months:  Yes.   Consequences of Substance Abuse: Husband became concerned due to patient mixing benzo and alcohol.  Previous Psychotropic Medications: Yes  Psychological Evaluations: No  Past Medical History:  Past Medical History:   Diagnosis Date  . Alcohol dependence ([place])   . Allergy   . Depression   . HTN (hypertension)     Past Surgical History:  Procedure Laterality Date  . CESAREAN SECTION     Family History:  Family History  Problem Relation Age of Onset  . Cancer Mother 6    breast  . Diabetes Maternal Grandmother   . Heart disease Maternal Grandmother   . Stroke Maternal Grandmother   . Alcohol abuse Sister   . Alcohol abuse Paternal Grandmother   . Early death Neg Hx    Family Psychiatric History: Maternal grandmother and twin sister with alcohol problems  Tobacco Screening: Denies Social History:  History  Alcohol Use  . 0.0 oz/week    Comment: quit Apr 29, 2014 and [facility]     History  Drug Use No    Additional Social History:      Pain Medications: Denies abuse Prescriptions: Denies abuse Over the Counter: Denies abuse History of alcohol / drug use?: Yes Longest period of sobriety (when/how long): Three months Negative Consequences of Use: Personal relationships Name of Substance 1: Alcohol 1 - Age of First Use: 19 1 - Amount (  size/oz): 8 ounces of vodka 1 - Frequency: 5 times per week 1 - Duration: Ongoing 1 - Last Use / Amount: 11/24/15, 8 ounces Vodka Name of Substance 2: Xanax 2 - Age of First Use: 50 2 - Amount (size/oz): 2 mg  2 - Frequency: daily 2 - Duration: Two days 2 - Last Use / Amount: 11/24/15, 2 mg                Allergies:   Allergies  Allergen Reactions  . Erythromycin     Stomach  cramps   Lab Results:  Results for orders placed or performed during the hospital encounter of 11/24/15 (from the past 48 hour(s))  Comprehensive metabolic panel     Status: Abnormal   Collection Time: 11/24/15 10:00 PM  Result Value Ref Range   Sodium 138 135 - 145 mmol/L   Potassium 3.8 3.5 - 5.1 mmol/L   Chloride 106 101 - 111 mmol/L   CO2 21 (L) 22 - 32 mmol/L   Glucose, Bld 90 65 - 99 mg/dL   BUN 14 6 - 20 mg/dL    Creatinine, Ser 0.71 0.44 - 1.00 mg/dL   Calcium 9.3 8.9 - 10.3 mg/dL   Total Protein 7.5 6.5 - 8.1 g/dL   Albumin 4.0 3.5 - 5.0 g/dL   AST 91 (H) 15 - 41 U/L   ALT 61 (H) 14 - 54 U/L   Alkaline Phosphatase 93 38 - 126 U/L   Total Bilirubin 0.9 0.3 - 1.2 mg/dL   GFR calc non Af Amer >60 >60 mL/min   GFR calc Af Amer >60 >60 mL/min    Comment: (NOTE) The eGFR has been calculated using the CKD EPI equation. This calculation has not been validated in all clinical situations. eGFR's persistently <60 mL/min signify possible Chronic Kidney Disease.    Anion gap 11 5 - 15  Ethanol     Status: Abnormal   Collection Time: 11/24/15 10:00 PM  Result Value Ref Range   Alcohol, Ethyl (B) 122 (H) <5 mg/dL    Comment:        LOWEST DETECTABLE LIMIT FOR SERUM ALCOHOL IS 5 mg/dL FOR MEDICAL PURPOSES ONLY   cbc     Status: Abnormal   Collection Time: 11/24/15 10:00 PM  Result Value Ref Range   WBC 9.8 4.0 - 10.5 K/uL   RBC 3.78 (L) 3.87 - 5.11 MIL/uL   Hemoglobin 12.0 12.0 - 15.0 g/dL   HCT 35.7 (L) 36.0 - 46.0 %   MCV 94.4 78.0 - 100.0 fL   MCH 31.7 26.0 - 34.0 pg   MCHC 33.6 30.0 - 36.0 g/dL   RDW 12.8 11.5 - 15.5 %   Platelets 282 150 - 400 K/uL  Rapid urine drug screen (hospital performed)     Status: Abnormal   Collection Time: 11/24/15 11:52 PM  Result Value Ref Range   Opiates NONE DETECTED NONE DETECTED   Cocaine NONE DETECTED NONE DETECTED   Benzodiazepines POSITIVE (A) NONE DETECTED   Amphetamines NONE DETECTED NONE DETECTED   Tetrahydrocannabinol NONE DETECTED NONE DETECTED   Barbiturates NONE DETECTED NONE DETECTED    Comment:        DRUG SCREEN FOR MEDICAL PURPOSES ONLY.  IF CONFIRMATION IS NEEDED FOR ANY PURPOSE, NOTIFY LAB WITHIN 5 DAYS.        LOWEST DETECTABLE LIMITS FOR URINE DRUG SCREEN Drug Class       Cutoff (ng/mL) Amphetamine      1000 Barbiturate  200 Benzodiazepine   614 Tricyclics       431 Opiates          300 Cocaine          300 THC               50     Blood Alcohol level:  Lab Results  Component Value Date   ETH 122 (H) 11/24/2015   ETH 242 (H) 54/00/8676    Metabolic Disorder Labs:  Lab Results  Component Value Date   HGBA1C 5.6 02/06/2013   No results found for: PROLACTIN Lab Results  Component Value Date   CHOL 258 (H) 02/06/2013   TRIG 73.0 02/06/2013   HDL 74.70 02/06/2013   CHOLHDL 3 02/06/2013   VLDL 14.6 02/06/2013    Current Medications: Current Facility-Administered Medications  Medication Dose Route Frequency Provider Last Rate Last Dose  . FLUoxetine (PROZAC) capsule 60 mg  60 mg Oral Daily Lurena Nida, NP   60 mg at 11/25/15 0902  . hydrOXYzine (ATARAX/VISTARIL) tablet 25 mg  25 mg Oral Q6H PRN Niel Hummer, NP      . lisinopril (PRINIVIL,ZESTRIL) tablet 10 mg  10 mg Oral Daily Lurena Nida, NP   10 mg at 11/25/15 0902  . losartan (COZAAR) tablet 25 mg  25 mg Oral Daily Lurena Nida, NP   25 mg at 11/25/15 0901  . mirtazapine (REMERON) tablet 15 mg  15 mg Oral QHS Lurena Nida, NP      . QUEtiapine (SEROQUEL) tablet 50 mg  50 mg Oral q morning - 10a Lurena Nida, NP       PTA Medications: Prescriptions Prior to Admission  Medication Sig Dispense Refill Last Dose  . ALPRAZolam (XANAX) 1 MG tablet Take 0.5-1 mg by mouth 3 (three) times daily as needed for anxiety.   11/24/2015 at Unknown time  . busPIRone (BUSPAR) 7.5 MG tablet Take 7.5 mg by mouth 3 (three) times daily.   11/24/2015 at Unknown time  . FLUoxetine (PROZAC) 20 MG capsule Take 60 mg by mouth daily.   11/23/2015 at Unknown time  . ibuprofen (ADVIL,MOTRIN) 200 MG tablet Take 400 mg by mouth every 6 (six) hours as needed for moderate pain.   11/23/2015 at Unknown time  . lisinopril (PRINIVIL,ZESTRIL) 10 MG tablet Take 10 mg by mouth daily.   11/24/2015 at Unknown time  . losartan (COZAAR) 25 MG tablet Take 25 mg by mouth daily.   11/23/2015 at Unknown time  . mirtazapine (REMERON) 15 MG tablet TAKE 1 TABLET (15 MG TOTAL) BY MOUTH AT  BEDTIME. 10 tablet 0 11/23/2015 at Unknown time  . QUEtiapine (SEROQUEL) 50 MG tablet TAKE 1 TABLET EVERY MORNING AND TAKE 1 TABLET IN THE EVENING (Patient taking differently: TAKE 1 TABLET EVERY MORNING) 20 tablet 0 11/23/2015 at Unknown time  . sertraline (ZOLOFT) 50 MG tablet Take 50 mg by mouth daily.   11/23/2015 at Unknown time    Musculoskeletal: Strength & Muscle Tone: within normal limits Gait & Station: normal Patient leans: N/A  Psychiatric Specialty Exam: Physical Exam  Review of Systems  Psychiatric/Behavioral: Positive for substance abuse. Negative for hallucinations, memory loss and suicidal ideas. The patient is nervous/anxious. The patient does not have insomnia.     Blood pressure 122/85, pulse 93, temperature 98.6 F (37 C), temperature source Oral, resp. rate 16, height '5\' 2"'$  (1.575 m), weight 81.6 kg (180 lb), last menstrual period 11/13/2015, SpO2 98 %.Body mass  index is 32.92 kg/m.  General Appearance: Casual  Eye Contact:  Good  Speech:  Clear and Coherent  Volume:  Normal  Mood:  Anxious  Affect:  Appropriate  Thought Process:  Coherent and Goal Directed  Orientation:  Full (Time, Place, and Person)  Thought Content:  Symptoms, worries, concerns   Suicidal Thoughts:  No  Homicidal Thoughts:  No  Memory:  Immediate;   Good Recent;   Good Remote;   Good  Judgement:  Fair  Insight:  Shallow  Psychomotor Activity:  Normal  Concentration:  Concentration: Good and Attention Span: Good  Recall:  Good  Fund of Knowledge:  Good  Language:  Good  Akathisia:  No  Handed:  Right  AIMS (if indicated):     Assets:  Communication Skills Desire for Improvement Financial Resources/Insurance Housing Leisure Time Physical Health Resilience Social Support Transportation  ADL's:  Intact  Cognition:  WNL  Sleep:         Treatment Plan Summary: Daily contact with patient to assess and evaluate symptoms and progress in treatment, Medication management and Plan  To discharge with outpatient follow up at [facility]   Observation Level/Precautions:  Continuous Observation Laboratory:  CBC Chemistry Profile UDS Psychotherapy:  Individual  Medications:  Remeron 15 mg hs for depression/insomnia, Seroquel 50 mg every am for mood control, Prozac 60 mg daily for depression  Consultations:  None Discharge Concerns:  Continued abuse of benzodiazepines and alcohol  Estimated LOS: Less than 24 hours  Other:  Patient to keep outpatient follow up appointments as scheduled     Frieda Arnall, Mickel Baas, NP 7/28/201710:43 AM

## 2015-11-25 NOTE — ED Notes (Signed)
TTS machine at bedside. 

## 2016-01-19 ENCOUNTER — Encounter (HOSPITAL_COMMUNITY): Payer: Self-pay | Admitting: Psychology

## 2016-01-19 ENCOUNTER — Other Ambulatory Visit (HOSPITAL_COMMUNITY): Payer: BLUE CROSS/BLUE SHIELD | Attending: Psychiatry | Admitting: Psychology

## 2016-01-19 ENCOUNTER — Encounter (HOSPITAL_COMMUNITY): Payer: Self-pay

## 2016-01-19 ENCOUNTER — Ambulatory Visit (HOSPITAL_COMMUNITY): Payer: BLUE CROSS/BLUE SHIELD | Admitting: Psychology

## 2016-01-19 ENCOUNTER — Encounter (INDEPENDENT_AMBULATORY_CARE_PROVIDER_SITE_OTHER): Payer: Self-pay

## 2016-01-19 DIAGNOSIS — F331 Major depressive disorder, recurrent, moderate: Secondary | ICD-10-CM | POA: Insufficient documentation

## 2016-01-19 DIAGNOSIS — G4700 Insomnia, unspecified: Secondary | ICD-10-CM | POA: Insufficient documentation

## 2016-01-19 DIAGNOSIS — F1021 Alcohol dependence, in remission: Secondary | ICD-10-CM | POA: Insufficient documentation

## 2016-01-19 DIAGNOSIS — I10 Essential (primary) hypertension: Secondary | ICD-10-CM | POA: Insufficient documentation

## 2016-01-19 DIAGNOSIS — F1020 Alcohol dependence, uncomplicated: Secondary | ICD-10-CM

## 2016-01-19 DIAGNOSIS — Z79899 Other long term (current) drug therapy: Secondary | ICD-10-CM | POA: Insufficient documentation

## 2016-01-22 ENCOUNTER — Encounter (HOSPITAL_COMMUNITY): Payer: Self-pay | Admitting: Psychology

## 2016-01-22 NOTE — Progress Notes (Signed)
    Daily Group Progress Note  Program: CD-IOP   01/22/2016 [name] [id number]  Diagnosis:  No diagnosis found.   Sobriety Date: 8/15  Group Time: 1-2:30 pm  Participation Level: Active  Behavioral Response: Appropriate and Sharing  Type of Therapy: Process Group  Interventions: Biofeedback  Topic: Process: the first part of group was spent in process. Members shared about what they had done to support their sobriety since we last met. They also discussed any challenges or temptations they had also experienced. Two guests were present to observe group and they introduce themselves. A new group member was present and she was asked to disclose what had brought her here. The session ended with a 10-minute guided meditation.   Group Time: 2:30-4 pm  Participation Level: Active  Behavioral Response: Sharing  Type of Therapy: Psycho-education Group  Interventions: Solution Focused  Topic: Psycho-Ed/Graduation: the second half of group was spent in a psycho-ed on Self-Care. A slide show was presented with emphasis on diet, sleep and exercise. Education, based on the most recent research, was provided on each of these topics and members discussed what their lifestyles have been in each instance. The psycho-ed ended early to allow for a graduation ceremony. Brownies were provided and the medallion was passed around with each member offering words of hope and gratitude to the graduating member.    Summary: The patient was new to the group and introduced herself during the first half today. She shared that she had just returned from [facility] in [place] and it had been a wonderful experience. The patient explained that she had addressed some of her guilt and shame that arose from the problems she had caused her family. She also shared that her husband had been diagnosed with stage 4 lung cancer despite never having smoked. The patient admitted she had not realized until  she went to treatment just how much she was hurting her husband. After the 10-minute guided meditation, this patient shared that the inpatient facility had emphasized the importance of meditation and mindfulness and they had practiced these exercises every day. During the psycho-ed, the patient admitted along with most of her fellow group members, that she had not eaten when she was drinking. She had no interest in it. She shared and she and her husband had gone for a walk last night and taken their dog. This had not happened in a long time. The patient admitted she didn't know the graduating member, but encouraged her to continue to work her program and keep talking to other women in recovery. She responded well to this first group session.   UDS collected: Yes Results: pending  AA/NA attended?: No, but she just returned home after 37 days of residential treatment in [place]?: No   Brandon Melnick, [place] 01/22/2016 1:27 PM

## 2016-01-23 ENCOUNTER — Other Ambulatory Visit (HOSPITAL_COMMUNITY): Payer: BLUE CROSS/BLUE SHIELD | Admitting: Psychology

## 2016-01-23 DIAGNOSIS — F1021 Alcohol dependence, in remission: Secondary | ICD-10-CM | POA: Diagnosis present

## 2016-01-23 DIAGNOSIS — Z79899 Other long term (current) drug therapy: Secondary | ICD-10-CM | POA: Diagnosis not present

## 2016-01-23 DIAGNOSIS — F331 Major depressive disorder, recurrent, moderate: Secondary | ICD-10-CM

## 2016-01-23 DIAGNOSIS — I10 Essential (primary) hypertension: Secondary | ICD-10-CM | POA: Diagnosis not present

## 2016-01-23 DIAGNOSIS — G4700 Insomnia, unspecified: Secondary | ICD-10-CM | POA: Diagnosis not present

## 2016-01-23 NOTE — Progress Notes (Addendum)
    Daily Group Progress Note  Program: CD-IOP   01/23/2016 Tennis [name] [id number]  Diagnosis:  Alcohol use disorder, severe, in early remission, dependence (HCC)  Major depressive disorder, recurrent episode, moderate (HCC)   Sobriety Date: 12/13/15  Group Time: 1-2:30 pm  Participation Level:

## 2016-01-23 NOTE — Progress Notes (Signed)
[name] is a 50 y.o. female patient. Orientation to CD-IOP: The patient is a 50 yo married, white, female referred to the CD-IOP. To address her alcohol dependence, she recently completed 37 days of residential treatment at [facility] in [place]. The patient returned home on Wednesday, September 20 and met on the 21st for her orientation and to begin this program. She lives in [place] with her husband and two sons, ages 26 and 59. While she has been drinking for years, the patient's problem with alcohol only became obvious over the past 2 years. In spring of 2015, the patient's husband was diagnosed with Stage 4 lung cancer. He has never smoked and appears to have observed excellent self-care over the years. This diagnosis proved devastating to the patient and her drinking escalated markedly. She entered and completed this program in late September of 2015. She relapsed and returned to heavy use, but followed up with residential treatment at [facility]. Periods of sobriety mixed with a return to use have been the pattern for months. Finally, the patient agreed to seek treatment in [place] in mid-August. Now that she is back, her family members have also returned to the home. Her husband has had his attorney draft details of an agreement that will require her to leave the home and culminate in separation and divorce should she return to drinking. In addition to her alcoholism, the patient is begin treated for depression and anxiety. Both conditions have presented themselves in the past few years. When asked about her childhood, the patient reported a happy and fulfilling one. She is a twin and her twin sister is also alcoholic and lives in [place]. She has two older brothers, one of whom lives in [place] while the other remains here in [place]. The patient's parents also live here in [place]. The patient met her husband-to-be in middle-school and they dated and later married after college.  They have lived in [place] for their entire married life. This patient's family life has been an affluent, socially-active one with many friends, summers at the country club, golf and entertaining. But between the patient's drinking and her husband's illness, their social lives have changed dramatically. While the patient's shame and guilt were prominent in early recovery, today, she displayed a commitment and motivation to sobriety previously absent. The patient reported her treatment experience at [facility] had been very helpful and she had been able to discuss issues that had previously been unaddressed. The patient emphasized returning to [place] and securing a sponsor quickly. She has good support within her family and community. The documentation was gathered, reviewed and the orientation completed accordingly. She will return this afternoon to begin the CD-IOP. Her sobriety date is 8/15.         Brandon Melnick, LCAS

## 2016-01-24 ENCOUNTER — Encounter (HOSPITAL_COMMUNITY): Payer: Self-pay | Admitting: Psychology

## 2016-01-24 NOTE — Progress Notes (Signed)
[name] is a 50 y.o. female patient. CD-IOP: Treatment Planning Session. Met with the patient this morning as scheduled. We agreed to meet early so she could go directly to the 10 am AA meeting at the [facility], just down the road on [address]. The patient reported she is still adjusting to being back home with her family after having spent 37 days in residential treatment in [place]. She reported her husband is being 'super sweet' and noted he hasn't been like this in many years. She shared about her oldest son who is seeking to play college golf and has already been offered a scholarship at [employer] in [place] would cover about $35,000 of the annual $50,000 of annual costs to attend this college. He is hoping to hear from [employer], where his father went to school, but he has to decide about the [place] within 2 weeks. The patient reported that her priority is her sobriety and that this is her #1 treatment goal. She agreed that building support among the Fellowship of [employer] is another goal. The patient admitted that she had not really tried different meetings and needed to meet other women and make actual friends that are in recovery. She became tearful as she recounted the last interaction with her 'best' friend. The friend had yelled at her and been very confrontational. I wondered if that was because she was scared and angry about what was happening and that she couldn't help her friend? This patient admitted she was uncertain, but got teary and admitted it had really hurt her. The friend did not call her while she was in [place] and has not phoned since she returned. We agreed to discuss how she might want to re-engage with her old friends and what she might say to eliminate any additional pressure. When asked about other goals, the patient reported she wanted to work on her spiritual self. She noted that she doesn't blame God for her husband's absence and neither does he. The  patient also reported the Tyrone Schimke at her church is a Hotel manager who is also in recovery. I encouraged her to contact him and have lunch one day. He might have some good recommendations. She identified how she might enhance and grow her faith and this completed the treatment plan. It was reviewed, signed and completed accordingly. We will continue to follow closely in the days and weeks ahead. Her sobriety date remains 8/15.        Brandon Melnick, LCAS

## 2016-01-25 ENCOUNTER — Encounter (HOSPITAL_COMMUNITY): Payer: Self-pay | Admitting: Medical

## 2016-01-25 ENCOUNTER — Other Ambulatory Visit (HOSPITAL_COMMUNITY): Payer: Self-pay | Admitting: Medical

## 2016-01-25 ENCOUNTER — Encounter (HOSPITAL_COMMUNITY): Payer: Self-pay | Admitting: Psychology

## 2016-01-25 ENCOUNTER — Other Ambulatory Visit (HOSPITAL_COMMUNITY): Payer: BLUE CROSS/BLUE SHIELD | Admitting: Psychology

## 2016-01-25 VITALS — BP 133/85 | HR 75 | Ht 62.0 in | Wt 171.4 lb

## 2016-01-25 DIAGNOSIS — I158 Other secondary hypertension: Secondary | ICD-10-CM

## 2016-01-25 DIAGNOSIS — F1021 Alcohol dependence, in remission: Secondary | ICD-10-CM

## 2016-01-25 DIAGNOSIS — T50905A Adverse effect of unspecified drugs, medicaments and biological substances, initial encounter: Secondary | ICD-10-CM

## 2016-01-25 DIAGNOSIS — F331 Major depressive disorder, recurrent, moderate: Secondary | ICD-10-CM

## 2016-01-25 DIAGNOSIS — F19982 Other psychoactive substance use, unspecified with psychoactive substance-induced sleep disorder: Secondary | ICD-10-CM

## 2016-01-25 MED ORDER — TRAZODONE HCL 100 MG PO TABS: 200.0000 mg | ORAL_TABLET | Freq: Every day | ORAL | 2 refills | Status: DC

## 2016-01-25 NOTE — Progress Notes (Signed)
Psychiatric Initial Adult Assessment   Patient Identification: [name] MRN:  [id number] Date of Evaluation:  01/25/2016 Referral Source: Self Chief Complaint:   Chief Complaint    Addiction Problem; Family Problem; Depression     Visit Diagnosis:    ICD-9-CM ICD-10-CM   1. Alcohol use disorder, severe, in early remission, dependence ([place]) 303.93 F10.21   2. Major depressive disorder, recurrent episode, moderate (HCC) 296.32 F33.1   3. Insomnia 780.52 G47.00   4. 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  Goals and Activities to Help Maintain Sobriety: 1. Stay away from old friends who continue to drink and use mind-altering chemicals. 2. Continue practicing Fair Fighting rules in interpersonal conflicts. 3. Continue alcohol and drug refusal skills and call on support systems. 4.  Seek Cancer support group for spouses /Attend Al Anon support groups for family alcoholism  Referrals: Golden Hurter NP   Aftercare services: below 1. Attend AA/NA meetings 90 meetings in 90 days 2. Continue with sponsor and a home group in [place]: Seek if needed  Next appointment: Oct 13 with Ricardo of [facility] to Address Continuing Problems: As above    Client has participated in the development of this discharge plan and has received a copy of this completed plan  Dara Hoyer   01/13/2014   [facility] Chem 01/13/2014       Pt was reoriented to CDIOP: Notes UnsignedSensitive Note  Encounter Date: 01/13/2014 Dara Hoyer, PA-C  Psychiatry     [facility] Dependency Intensive Outpatient Discharge Summary   [name] [id number]  Date of Admission: 7/210/2015 Date of Discharge: 01/13/2014  Course of Treatment: Pt has successfully completed her CD IOP Program requirements for treatment of her alcohol dependency including attending Group  And Individual counseling sessions/AA meetings/obtaining sponsorship and Home group.  Goals and Activities to Help Maintain Sobriety: 4. Stay away from old friends who continue to drink and use mind-altering chemicals. 5. Continue practicing Fair Fighting rules in interpersonal conflicts. 6. Continue alcohol and drug refusal skills and call on support systems. 4.  Seek Cancer support group for spouses /Attend Al Anon support groups for family alcoholism  Referrals: Golden Hurter NP   Aftercare services: below 3. Attend AA/NA meetings 90 meetings in 90 days 4. Continue with sponsor and a home group in [place]: Seek if needed  Next appointment: Oct 13 with Floris [facility] to Address Continuing Problems: As above  Client has participated in the development of this discharge plan and has received a copy of this completed plan  Dara Hoyer  01/13/2014 [facility]-Ciop[facility] Chem 01/13/2014     Pt was reoriented to CDIOP: Encounter Date: 9/21/2017Ann Gwenlyn Found copied text [name] is a 50 y.o. female patient. Orientation to CD-IOP: The patient is a 50 yo married, white, female referred to the CD-IOP.  To address her alcohol dependence, she recently completed 37 days of residential treatment at [facility] in [place]. The patient returned home on Wednesday, September 20 and met on the 21st for her orientation and to begin this program. She  lives in [place] with her husband and two sons, ages 48 and 35. While she has been drinking for years, the patient's problem with alcohol only became obvious over the past 2 years. In spring of 2015, the patient's husband was diagnosed with Stage 4 lung cancer. He has never smoked and appears to have observed excellent self-care over the years. This diagnosis proved devastating to the patient and her drinking escalated markedly. She entered and completed this program in late September of 2015. She relapsed and returned to heavy use, but followed up with residential treatment at [facility]. Periods of sobriety mixed with a return to use have been the pattern for months. Finally, the patient agreed to seek treatment in [place] in mid-August. Now that she is back, her family members have also returned to the home. Her husband has had his attorney draft details of an agreement that will require her to leave the home and culminate in separation and divorce should she return to drinking. In addition to her alcoholism, the patient is begin treated for depression and anxiety. Both conditions have presented themselves in the past few years. When asked about her childhood, the patient reported a happy and fulfilling one. She is a twin and her twin sister is also alcoholic and lives in [place]. She has two older brothers, one of whom lives in [place] while the other remains here in [place]. The patient's parents also live here in [place]. The patient met her husband-to-be in middle-school and they dated and later married after college. They have lived in [place] for their entire married life. This patient's family life has been an affluent, socially-active one with many friends, summers at the country club, golf and entertaining. But between the patient's drinking and her husband's illness, their social lives have changed dramatically. While the patient's shame and guilt were prominent in early recovery, today, she  displayed a commitment and motivation to sobriety previously absent. The patient reported her treatment experience at [facility] had been very helpful and she had been able to discuss issues that had previously been unaddressed. The patient emphasized returning to [place] and securing a sponsor quickly. She has good support within her family and community. The documentation was gathered, reviewed and the orientation completed accordingly. She will return this afternoon to begin the CD-IOP. Her sobriety date is 8/15.  Brandon Melnick, LCAS    Electronically signed by Brandon Melnick, LCAS at 01/23/2016  9:21 AM    In speaking with her today she says she is very happy an bout the care she received at [facility].She does admit she remains in troubled relationship with her 56 yo son who is angry with her and sent emails /texts to family about her when she was relapsing.She says her drinking really went out of hand when her husband seperated from her but their relationship is "like we're falling in love all over again", The addition of Wellbutrin tom her medications has improved her anxiety and depression greatly sinc she and her husband are back together.   Associated Signs/Symptoms: Depression Symptoms:  depressed mood, feelings of worthlessness/guilt, difficulty concentrating, disturbed sleep, all symptoms level 1 on PHQ 9 Total score 5 No difficulty (Hypo) Manic Symptoms:  Impulsivity, Irritable Mood, Labiality of  Mood,    Substance abuse related Anxiety Symptoms: NA off alcohol significant Panic disorder when drinking GAD 7 score 4 Psychotic Symptoms:  NA             PTSD Symptoms:             Ever had a traumatic exposure:  Negative            Had a traumatic exposure in the last month:  No            Re-experiencing: Negative None            Hypervigilance:  Negative            Hyperarousal: Negative None           Avoidance: Negative None            Traumatic Brain Injury: Negative na   Past  Psychiatric History: Diagnosis: Anxiety by PCP/Depression dx 6/15-failure to diagnose alcohol abuse  Hospitalizations:[facility] for intoxication;[facility] 2017  Outpatient Care: [facility] CD IOP 11/11/13;  Substance Abuse Care:above  Self-Mutilation: NA  Suicidal Attempts: NA  Violent Behaviors: NA     Previous Psychotropic Medications: { Medication  Dose     xanax          70m   Prozac___469m__________Buspar___10mg____________      Substance Abuse History in the last 12 months: Substance Age of 1st Use Last Use Amount Specific Type  Nicotine 0 0 0 0  Alcohol 21 12/12/15 1/2 5th vodka  Cannabis [age] experimented in college pot  Opiates 0 0 0 0  Cocaine 0 0 0 0  Methamphetamines 0 0 0 0  LSD 0 0 0 0  Ecstasy 0 0 0 0  Benzodiazepines 48  12/12/15   xanax  Caffeine Not elicited 0 0 0  Inhalants 0 0 0 0  Others: na na na na              Consequences of Substance Abuse Medical Consequences of Substance Abuse:NA Legal Consequences of Substance Abuse: NA  Family Consequences of Substance Abuse: Disapointment/Embarrasing moments   Blackouts:  Yes DT's:  NA Withdrawal Symptoms:  Negative None  Past Medical History:  Past Medical History:  Diagnosis Date  . Alcohol dependence ([place]  . Allergy   . Depression   . HTN (hypertension)     Past Surgical History:  Procedure Laterality Date  . CESAREAN SECTION      Family Psychiatric History: Paternal GM and twin sister alcohol and depression Family History:  Family History  Problem Relation Age of Onset  . Cancer Mother [id number]  breast  . Diabetes Maternal Grandmother   . Heart disease Maternal Grandmother   . Stroke Maternal Grandmother   . Alcohol abuse Sister   . Alcohol abuse Paternal Grandmother   . Early death Neg Hx     Social History:   Social History   Social History  . Marital status: Married    Spouse name: N/A  . Number of children: N/A  . Years of education:  N/A   Social History Main Topics  . Smoking status: Never Smoker  . Smokeless tobacco: Never Used  . Alcohol use 0.0 oz/week     Comment: quit Apr 29, 2014 and [facility]  . Drug use: No  . Sexual activity: Yes    Birth control/ protection: Surgical   Other Topics Concern  .  None   Social History Narrative  . 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 Legal History: NA Hobbies/Interests: Cooking/tennis /swim/family and friends        Additional History:  Date of Evaluation:  11/20/2013 Chief Complaint: " To get well to be there for my family " History of Chief Complaint:  Pt is 50 yo WF Self referred thru husband after he discovered he had Stage IV Non small cell Lung CA 2 weeks ago and who has been concerned about her drinking especially in lihgt of the potentiality she would be responsible for them should he not respond to treatment.Upon learning of her husbamnd's fate pt agreed to quit drinking and attend [facility] CD IOP the patient had been a "light drinker in college and up until 1 year ago when she began to display signs of inability to control her consumption resulting in a number of "embarassing" incidents involving family;friends and social events.She admits she doesnt remeber some of these incidents.She would stop drinking for a time after such an incudent but would for some unknown reason to  her start drinking again.In the past year she was diagnosed with anxiety and prescribed xanax.On July 6th she was brought to ED by family with ataxia and dysarthria denying ETOH use but subsequently was diagnosed as intoxicated from the combination of alcohol and xanax.She was also dx'd as depressed and prescribed Prozac in June. 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     Allergies:   Allergies  Allergen Reactions  . Erythromycin     Stomach  cramps    Metabolic Disorder Labs: Lab Results  Component Value Date   HGBA1C 5.6 02/06/2013   No results found for: PROLACTIN Lab Results  Component Value Date   CHOL 258 (H) 02/06/2013   TRIG 73.0 02/06/2013   HDL 74.70 02/06/2013   CHOLHDL 3 02/06/2013   VLDL 14.6 02/06/2013     Current Medications: Current Outpatient Prescriptions  Medication Sig Dispense Refill  . buPROPion (WELLBUTRIN XL) 300 MG 24 hr tablet     . Oxcarbazepine (TRILEPTAL) 300 MG tablet Take 300 mg by mouth 2 (two) times daily.  0  . sertraline (ZOLOFT) 100 MG tablet Take 200 mg by mouth daily.  0  . topiramate (TOPAMAX) 100 MG tablet TAKE ONE & ONE-HALF TABLET AT BEDTIME  0  . traZODone (DESYREL) 100 MG tablet Take 2 tablets (200 mg total) by mouth at bedtime. 60 tablet 2   No current facility-administered medications for this visit.     Neurologic:  Headache: Negative Seizure: Negative Paresthesias:Negative  Musculoskeletal: Strength & Muscle Tone: within normal limits Gait & Station: normal Patient leans: N/A  Psychiatric Specialty Exam: ROS    Constitutional: Positive for  activity change and appetite change. Negative for fever, chills, diaphoresis, fatigue and unexpected weight change.       Feels better now off alcohol and with medication adjustments at [place]: Negative for congestion, dental problem, ear discharge, facial swelling, hearing loss, mouth sores, nosebleeds and postnasal drip.   Eyes: Negative.  Negative for photophobia, pain, discharge, redness, itching and visual disturbance.  Respiratory: Negative.  Negative for apnea, cough, choking, chest tightness, shortness of breath, wheezing and stridor.   Cardiovascular: Negative.  Negative for chest pain, palpitations and leg swelling.  Gastrointestinal: Negative.   Endocrine: Negative.  Negative for cold intolerance, heat intolerance, polydipsia, polyphagia and polyuria.  Musculoskeletal: Negative.   Skin: Negative.   Allergic/Immunologic: Negative.  Negative for environmental allergies, food allergies and immunocompromised state.  Neurological: Positive for headaches (thinks from not sleeping well). Negative for dizziness, seizures, syncope, facial asymmetry, speech difficulty, weakness, light-headedness and numbness.  Hematological: Negative.  Negative for adenopathy. Does not bruise/bleed easily.  Psychiatric/Behavioral: Positive for behavioral problems (Etoh dependence progressed to severe dependence SUD and sleep disturbance at 150 mg Trazodone. Negative for confusion and decreased concentration. The patient is not nervous/anxious.    Blood pressure 133/85, pulse 75, height _0  (1.575 m), weight 171 lb 6.4 oz (77.7 kg).Body mass index is 31.35 kg/m.  General Appearance: Well Groomed  Eye Contact:  Good  Speech:  Clear and Coherent  Volume:  Normal  Mood:  Variable  Affect:  Appropriate and Congruent  Thought Process:  Descriptions of Associations: Intact  Orientation:  Full (Time, Place, and Person)  Thought Content:  Logical  Suicidal Thoughts:  No  Homicidal Thoughts:  No  Memory:   Negative  Judgement:  Other:  Improving  Insight:  Improving  Psychomotor Activity:  Normal  Concentration:  Concentration: NA and Attention Span: NA  Recall:  Good  Fund of Knowledge:Good  Language: Good  Akathisia:  NA  Handed:  Right  AIMS (if indicated):  NA  Assets:  Desire for Improvement Financial Resources/Insurance Housing Intimacy Resilience Social Support Talents/Skills Transportation Vocational/Educational  ADL's:  Intact  Cognition: WNL  Sleep:  Trouble staying and going back to sleep    Treatment Plan Summary: Treatment Plan/Recommendations:  Plan of Care: [facility] [place] CD IOP  Laboratory:  UDS per protocol and insurance  Psychotherapy: IOP Group;individual and Family  Medications: See list Trazodone increased to 200 mg HS  Routine PRN Medications:  No  Consultations: NA at this time  Safety Concerns:  Relapse prevention  Other:  None     Darlyne Russian, PA-C 9/27/20175:41 PM

## 2016-01-25 NOTE — Progress Notes (Signed)
    Daily Group Progress Note  Program: CD-IOP   01/25/2016 [name] [id number]  Diagnosis:  No diagnosis found.   Sobriety Date: 8/15  Group Time: 1-2:30 pm  Participation Level: Active  Behavioral Response: Sharing  Type of Therapy: Process Group  Interventions: Supportive  Topic: Process: the first half of group was spent in process. Members shared about the past weekend and any activities they had engaged in to support or strengthen their recovery. They also shared about any challenges or temptations they may have experienced. A new group member was present and he introduced himself. The program director was here today and met with a group member. Random drug tests were collected.   Group Time: 2:30-4 pm  Participation Level: Active  Behavioral Response: Appropriate and Sharing  Type of Therapy: Psycho-education Group  Interventions: Solution Focused  Topic: Psycho-Ed: the second half of group began with a 5-minute meditation. Group members provided valuable feedback about the music and this type of meditation. The topic for the psycho-ed was "Resentments". Members shared about why resentments are the #1 offender, as quoted in the [employer]. The benefits that resentments provide were identified and a handout on addressing one's resentments was distributed with plans to review in upcoming sessions.  Summary: The patient reported she had the house to herself this weekend. It was her first weekend back after being in rehab for 37 days and she was a little disappointed that her husband wasn't home. But she was also pleased that they were all doing things they loved doing. She attended 2 AA meetings and managed to keep herself busy. The patient shared that before she left for rehab, her husband and 3 sons had all left the home. She realizes that "that was my bottom". This counselor pointed out that for some, the bottom is when one experiences, "ego-deflation at depth". She  agreed that this was where she was at that point. During the meditation, the patient admitted she had a difficult time focusing. She pointed out that her mind is racing 24/7 and agreed with another member that having words instead of just music would be more beneficial for her. In the psycho-ed, the patient reported she feels a lot of guilt and shame over her behaviors and how her drinking was impacting her family, especially her husband. She has a lot of resentment towards herself and feels very badly about this. The patient acknowledged that she will have to work on this and address her resentments towards self and eventually 'forgive myself'. The patient made some good comments and she responded well to this intervention.   UDS collected: No Results:   AA/NA attended?: YesFriday and Sunday  Sponsor?: No, but she is seeking out a sponsor   Brandon Melnick, [place] 01/25/2016 8:35 AM

## 2016-01-26 ENCOUNTER — Other Ambulatory Visit (HOSPITAL_COMMUNITY): Payer: BLUE CROSS/BLUE SHIELD | Admitting: Psychology

## 2016-01-26 DIAGNOSIS — F1021 Alcohol dependence, in remission: Secondary | ICD-10-CM

## 2016-01-26 DIAGNOSIS — F331 Major depressive disorder, recurrent, moderate: Secondary | ICD-10-CM | POA: Diagnosis not present

## 2016-01-28 NOTE — Progress Notes (Signed)
    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 Several group members met with the program director for orientation activities and to discuss medication management. All members were active and engaged in discussion concerning recovery from mind-altering drugs and alcohol.   Group Time: 2:30-4 pm  Participation Level: Active  Behavioral Response: Appropriate and Sharing  Type of Therapy: Psycho-education Group  Interventions: Solution Focused  Topic: Psycho-education: A chaplain associated with [facility] led the psychoeducation portion of the group. The session began with a mindfulness exercise, and the chaplain presented a CBT based flow chart regarding patient reactions to negative experiences. Counselors helped facilitate discussion among group members regarding this topic. Drug tests were collected from several group members. PHQ-9 assessments were administered to all group members.    Summary: Patient presented as active and engaged in the group. She was able to attend 2 AA meetings. After the previous session, the patient learned that one of her sons had blocked her number on his phone. The patient was able to have a productive conversation with her son regarding his needs and did not get upset. She reports that being with her family has been "better than expected" and that her husband was being "sweet" to her. She shared details of a legal agreement with her  husband -if she drank they would get a divorce. The patient reported being triggered by a radio commercial but was able to maintain her sobriety. The counselors encouraged her to continue using relapse prevention skills. Later in the session, she met with the program director as part of the orientation to the group. Counselor administered PHQ-9 assessment as part of ongoing record keeping. A drug test was collected from the patient.     UDS collected: Yes Results: pending  AA/NA attended?: YesTuesday and Wednesday  Sponsor?: No, but the patient is seeking a sponsor   Brandon Melnick, [place] 01/28/2016 1:05 PM

## 2016-01-30 ENCOUNTER — Other Ambulatory Visit (HOSPITAL_COMMUNITY): Payer: BLUE CROSS/BLUE SHIELD | Attending: Psychiatry | Admitting: Psychology

## 2016-01-30 ENCOUNTER — Encounter (HOSPITAL_COMMUNITY): Payer: Self-pay | Admitting: Psychology

## 2016-01-30 DIAGNOSIS — F411 Generalized anxiety disorder: Secondary | ICD-10-CM | POA: Diagnosis not present

## 2016-01-30 DIAGNOSIS — F1021 Alcohol dependence, in remission: Secondary | ICD-10-CM

## 2016-01-30 DIAGNOSIS — F331 Major depressive disorder, recurrent, moderate: Secondary | ICD-10-CM

## 2016-01-30 NOTE — Progress Notes (Signed)
    Daily Group Progress Note  Program: CD-IOP   01/30/2016 [name] [id number]  Diagnosis:  Alcohol use disorder, severe, in early remission, dependence (HCC)  Major depressive disorder, recurrent episode, moderate (HCC)   Sobriety Date: 12/13/15  Group Time: 1-2:30  Participation Level: Active  Behavioral Response: Appropriate and Sharing  Type of Therapy: Process Group  Interventions: CBT, Motivational Interviewing and Solution Focused  Topic: Patients were active and engaged for group process session. Counselors encouraged patients to share openly about their successes and challenges in recovery from mind-altering drugs and alcohol. Patients took turns checking in and updating the group on recovery-related activities.   Group Time: 2:30-4  Participation Level: Active  Behavioral Response: Appropriate and Sharing  Type of Therapy: Psycho-education Group  Interventions: Other: sprituality psychoeducation  Topic: Patients were active and engaged for group psychoeducation session. Counselors facilitated a discussion on "spirituality and recovery". Patients discussed their personal faith stories, higher powers, and how prayer can help aid in recovery. Many patients reported that the session was helpful and "opened their eyes to other perspectives".    Summary: Patient was active and engaged in group. She reported that she attended 1 AA meeting since our last group. She as excited and happy to report that her son had "unblocked her phone number" and was beginning to reach out more. The relationship with her sons is "on the mend" since her drinking caused frustration in her family members. Patient asked the group about some AA meetings she was interested in visiting. The group was supportive and encouraged her to try other meetings which she agreed with. Patient hopes to find a sponsor soon and has been asking around at meetings, she reports. Patient seems to be building on  the success and progress made in her last residential tx and says that her family is very supportive which is encouraging her. She is sleeping well and her medications seem to be working as px. Melinda LodgeWes Avery, LPC-A   UDS collected: No Results: negative  AA/NA attended?: YesWednesday  Sponsor?: No   [name], LCAS 01/30/2016 9:50 AM

## 2016-01-31 ENCOUNTER — Encounter (HOSPITAL_COMMUNITY): Payer: Self-pay | Admitting: Psychology

## 2016-01-31 NOTE — Progress Notes (Signed)
[name] is a 50 y.o. female patient. CD-IOP: Individual Counseling Session. Patient met with the patient this morning for her weekly individual counseling session. We meet early Tuesday morning so she can attend the 10 am AA meeting at the [facility]. The patient reported she is doing well. She is focusing on her recovery and her relationship with her family. She is attending different [place] meetings seeking to find ones where there are women like her. The patient is also looking for a sponsor. We reviewed the meeting list and I made some suggestions. When asked what she is doing to strengthen her faith and spiritual self, her 3rd treatment goal, the patient reported she is reading the Daily Reflection and some Darrick Meigs based daily meditations. She stated she reads them at night while I encouraged her to consider reading them in the morning. "They are meant to provide a guide to that day and you are reading them when the day is over", I reminded her. The patient agreed to begin reading some of these daily reflections in the mornings. At my encouragement, she also agreed to contact the youth pastor at her church. He is in recovery and had welcomed her at the first service she attended upon returning from rehab. The patient agreed that he would be a wonderful resource and may know some other women in recovery that she could contact. The patient reported she is enjoying being back home and getting into a new routine. She noted that today is 49 days sober. Her husband had seen the #48 on the calendar yesterday morning and had phoned her and told her how proud he was of her. She had been pleased to hear that from him. The patient reported she is going to make an apple cake tomorrow for group and the graduation scheduled for tomorrow. Our session ended and I walked her out to the lobby where she headed for her 10 am meeting. The patient is doing well in the program and reported her trazadone is providing her with  a good night's sleep. She is stable on her other meds and does not report depressive symptoms or the anxiety she had displayed prior to entering treatment. We will continue to follow closely in the days and week ahead. Her sobriety date remains 8/15.        Brandon Melnick, LCAS

## 2016-02-01 ENCOUNTER — Other Ambulatory Visit (HOSPITAL_COMMUNITY): Payer: BLUE CROSS/BLUE SHIELD | Admitting: Psychology

## 2016-02-01 ENCOUNTER — Encounter (HOSPITAL_COMMUNITY): Payer: Self-pay | Admitting: Psychology

## 2016-02-01 DIAGNOSIS — F331 Major depressive disorder, recurrent, moderate: Secondary | ICD-10-CM

## 2016-02-01 DIAGNOSIS — F1021 Alcohol dependence, in remission: Secondary | ICD-10-CM

## 2016-02-01 DIAGNOSIS — F411 Generalized anxiety disorder: Secondary | ICD-10-CM

## 2016-02-01 NOTE — Progress Notes (Signed)
    Daily Group Progress Note  Program: CD-IOP   02/01/2016 [name] [id number]  Diagnosis:  No diagnosis found.   Sobriety Date: 8/15  Group Time: 1-2:30 pm  Participation Level: Active  Behavioral Response: Appropriate  Type of Therapy: Activity Group  Interventions: Chair Yoga  Topic: Chair Yoga: the first part of group was spent in an activity downstairs in the gym. Members were escorted downstairs and sat in chairs arranged in a circle. A certified yoga instructor was waiting for the group and explained what this session would look like. She asked about previous experiences with yoga and whether members had any physical problems or issues that they should be aware of. The session continued with members following the instructor in different poses and movements. At the completion of the session, members were invited to process any feelings or bodily experiences they may have noticed during the session.   Group Time: 2:30-4 pm  Participation Level: Active  Behavioral Response: Appropriate and Sharing  Type of Therapy: Process Group  Interventions: Supportive  Topic: Process: the second half of group was spent in process. Members shared about the past weekend and identified any challenges or temptations they might have experienced. They shard what they had done to strengthen their recovery, including attending meetings, step work, sessions with sponsor and different types of good self-care. There were no new sobriety dates, indicating that everyone had remained abstinent over the weekend. Drug tests were collected from random group members.   Summary: The patient reported she had done some yoga, but it had been a long time. After the yoga and during the discussion period, she admitted she had been very anxious prior to the session, but it seemed to get worked out as she progressed through the different yoga positions. In process, the patient reported it had been a crazy  weekend. She and her husband had a date night and they went to [employer]. the patient attended 2 AA meetings and got 29 phone numbers from the 11 am Saturday [facility] meeting. She also saw a woman in one of the meetings who she had known for a long time and had no idea she was an alcoholic. The patient reported she had bought flowers for her front porch and showed the picture on her cell phone. She enjoys sitting out on her porch reading and enjoying a pretty setting. The patient reported she was on her way to the 10 am meeting this morning when her son called from school. He had gotten sick and she headed to [place] instead and brought him home. The patient denied any cravings or thoughts of using. She provided helpful feedback to a fellow group member and made some insightful comments. The patient responded well to this intervention.    UDS collected: No Results:   AA/NA attended?: Yes, Friday and Saturday  Sponsor?: No   [name], LCAS 02/01/2016 9:27 AM

## 2016-02-02 ENCOUNTER — Other Ambulatory Visit (HOSPITAL_COMMUNITY): Payer: BLUE CROSS/BLUE SHIELD | Admitting: Psychology

## 2016-02-02 DIAGNOSIS — F331 Major depressive disorder, recurrent, moderate: Secondary | ICD-10-CM

## 2016-02-02 DIAGNOSIS — F411 Generalized anxiety disorder: Secondary | ICD-10-CM

## 2016-02-02 DIAGNOSIS — F1021 Alcohol dependence, in remission: Secondary | ICD-10-CM | POA: Diagnosis not present

## 2016-02-03 LAB — TOXASSURE SELECT,+ANTIDEPR,UR

## 2016-02-03 NOTE — Progress Notes (Signed)
    Daily Group Progress Note  Program: CD-IOP   02/03/2016 Tennis ShipSarah B Coole [id number]  Diagnosis:  Alcohol use disorder, severe, in early remission, dependence (HCC)  Major depressive disorder, recurrent episode, moderate (HCC)  Generalized anxiety disorder   Sobriety Date: 12/13/15  Group Time: 1-2:30  Participation Level: Active  Behavioral Response: Appropriate and Sharing  Type of Therapy: Process Group  Interventions: CBT and Motivational Interviewing  Topic:  Counselors led patients in 1.5 hour group process session focused on cognitions, behaviors, and feelings related to recovery from mind-altering drugs and alcohol. All members were active and engaged and shared openly. One member spoke in depth about his anger towards his godson and other members shared feedback. Drug tests were collected from some members. One member graduated successfully.     Group Time: 2:30-4  Participation Level: Active  Behavioral Response: Appropriate and Sharing  Type of Therapy: Psycho-education Group  Interventions: Supportive and Other: addiction psychoeducation  Topic: Counselors led patients in 1.5 hour group psychoeducation session focused on the "Jellanik Chart" of addiction. Members shared their addiction story and what consequences they had suffered. Counselors helped to encourage and reframe negative thoughts and feelings with an emphasis on recovery and the future. Members shared openly with each other about consequences.   Summary: Patient presented as active and engaged in group sessions. She stated that she did not attend any 12 step meetings since Monday. She reported that she had dinner with her old roommate from residential tx a few months ago. This made her feel positive and hopeful since she felt "very connected" to her. Patient shared tearfully that she found herself getting frustrated easily with her family members. Counselor used probing questions to ascertain that  patient's family may be struggling with the patient's transition to early recovery and that trust is still being established. Patient agreed and received supportive and challenging feedback from other group members. Dorann LodgeWes Swan, LPC-A   UDS collected: No Results: negative  AA/NA attended?: No  Sponsor?: Yes   Charmian Muffnn Dare Sanger, LCAS 02/03/2016 10:53 AM

## 2016-02-03 NOTE — Progress Notes (Signed)
    Daily Group Progress Note  Program: CD-IOP   02/03/2016 [name] [id number]  Diagnosis:  Alcohol use disorder, severe, in early remission, dependence (HCC)  Major depressive disorder, recurrent episode, moderate (HCC)  Generalized anxiety disorder   Sobriety Date: 12/13/15  Group Time: 1-2:30  Participation Level: Active  Behavioral Response: Appropriate and Sharing  Type of Therapy: Process Group  Interventions: CBT and Motivational Interviewing  Topic: Counselors met with patients for 1.5 hour group discussion focused on processing emotions, thoughts, and behaviors related to recovery from drugs/alcohol. Patients were active and engaged. Counselors used open questions and reframing to help guide patients into a deeper understanding of their experience. Some members shared feedback and one member apologized for previous "misguided advice-giving". Counselor led group in a 5 minute guided imagery meditation utilizing the image of a candle and stress-relief.     Group Time: 2:30-4  Participation Level: Active  Behavioral Response: Appropriate and Sharing  Type of Therapy: Psycho-education Group  Interventions: Meditation: guided imagery  Topic: Counselors met with patients for 1.5 hour group psychoeducation session focused on "Addiction and the Brain". Counselors utilized a PowerPoint presentation with discussion questions, research data, and videos from "pleasure unwoven" to educate members on the neuroscience of addiction. Members discussed the role of Dopamine in addiction and the potential stigma of living in recovery from drugs/alcohol. Patients were active and engaged and asked specific questions to better understand the topic.    Summary: Patient presented as active and engaged in group sessions. She attended 1 AA meeting since yesterday. She reported that she had her feelings hurt at a meeting when she was "cut off" by another AA member. Patient shared that  she had gotten a sponsor at the meeting. Patient reported she is reading her big book and feels supported by her family. Patient seemed to agree with disease model of addiction and was able to articulate basics of the neuroscience of addiction. Wes Swan, LPC-A   UDS collected: No Results: negative  AA/NA attended?: YesWednesday  Sponsor?: Yes    , LCAS 02/03/2016 1:17 PM 

## 2016-02-06 ENCOUNTER — Other Ambulatory Visit (HOSPITAL_COMMUNITY): Payer: BLUE CROSS/BLUE SHIELD | Admitting: Psychology

## 2016-02-06 DIAGNOSIS — F1021 Alcohol dependence, in remission: Secondary | ICD-10-CM

## 2016-02-06 DIAGNOSIS — F331 Major depressive disorder, recurrent, moderate: Secondary | ICD-10-CM

## 2016-02-07 ENCOUNTER — Encounter (HOSPITAL_COMMUNITY): Payer: Self-pay | Admitting: Psychology

## 2016-02-07 NOTE — Progress Notes (Signed)
Daily Group Progress Note  Program: CD-IOP   02/07/2016 [name] [id number]  Diagnosis:  No diagnosis found.   Sobriety Date: 8/15  Group Time: 1-2:30 pm  Participation Level: Active  Behavioral Response: Sharing  Type of Therapy: Process Group  Interventions: Supportive  Topic: Process: the first part of group was spent in process. Members shared about the past weekend and what they had done to support their recovery. One member checked-in with a new sobriety date. She described the argument she had had yesterday with her parents and how she had left the house and gone to the store. She had intended to buy alcohol, but saw an old drug dealer who gave her a bag of dope and a syringe. She also drank. The return to use was processed with members provided helpful feedback.  Group Time: 2:30-4 pm  Participation Level: Active  Behavioral Response: Sharing  Type of Therapy: Psycho-education Group  Interventions: Solution Focused  Topic: Psycho-Ed: Relapse Prevention /Grounding Exercise. The second half of group was spent in psycho-ed on relapse prevention. The 4 - step relapse process was discussed and members shared what they could possibly do to break this process. The importance of identifying internal and external triggers was discussed and members encouraged to consider and practice what they would do under certain circumstances. Finally, the session ended with a grounding exer4cise from 'Seeking Safety". Members stated that they felt more relaxed or more peaceful than before they had begun the exercise. One drug test was collected and the program director met with one group member to discuss medications.   Summary: The patient reported she attended 2 AA meetings since we last met. She went to the 10 am today and the 4 pm Women's Speaker meeting on Sunday. The patient reported a busy weekend. She expressed frustration about her husband and explained that he wants her to  get a full-time job. She admitted she had told him she would back before she went to treatment, "but I was drinking and didn't mean it", she laughed. She is getting help with her resume from a good friend. She noted that her son came over for dinner and they had a good talk. She openly discussed what getting a full-time job would mean and the patient indicated she doesn't think her husband realizes just how much she does in a day to manage this family. When the counselor asked what he hopes to accomplish with this, the patient suggested it would keep her busy and less likely to drink. In the psycho-ed, the patient agreed with another member that this program gives her the beginnings of a routine and she is beginning her day consistently the same and it feels good. The patient reported that she enjoyed the 'grounding exercise' and she did feel more relaxed. The patient is sober and re-aclamating to her home life after being in rehab in [place] for 37 days. There is still some tension and anger with family about her drinking, but she has stated her husband has been very supportive of her. The patient responded well to this intervention.   UDS collected: No Results:   AA/NA attended?: YesMonday  Sponsor?: No   Brandon Melnick, LCAS 02/07/2016 1:55 PM

## 2016-02-08 ENCOUNTER — Other Ambulatory Visit (HOSPITAL_COMMUNITY): Payer: BLUE CROSS/BLUE SHIELD | Admitting: Psychology

## 2016-02-08 DIAGNOSIS — F1021 Alcohol dependence, in remission: Secondary | ICD-10-CM

## 2016-02-09 ENCOUNTER — Encounter (HOSPITAL_COMMUNITY): Payer: Self-pay | Admitting: Psychology

## 2016-02-09 ENCOUNTER — Other Ambulatory Visit (HOSPITAL_COMMUNITY): Payer: BLUE CROSS/BLUE SHIELD | Admitting: Psychology

## 2016-02-09 DIAGNOSIS — F411 Generalized anxiety disorder: Secondary | ICD-10-CM

## 2016-02-09 DIAGNOSIS — F1021 Alcohol dependence, in remission: Secondary | ICD-10-CM | POA: Diagnosis not present

## 2016-02-09 DIAGNOSIS — F331 Major depressive disorder, recurrent, moderate: Secondary | ICD-10-CM

## 2016-02-10 ENCOUNTER — Encounter (HOSPITAL_COMMUNITY): Payer: Self-pay | Admitting: Psychology

## 2016-02-10 NOTE — Progress Notes (Signed)
    Daily Group Progress Note  Program: CD-IOP   02/10/2016 [name] [id number]  Diagnosis:  No diagnosis found.   Sobriety Date: 8/15  Group Time: 1-2:30 pm  Participation Level: Active  Behavioral Response: Appropriate and Sharing  Type of Therapy: Process Group  Interventions: Supportive  Topic: Process: Counselors met with patients to discuss recovery-related activities the group members had engaged in since the last group. A patient returned for a final group to discuss their reasons for leaving treatment, and helped provide closure for the group. All members were active and engaged in discussion concerning recovery from mind-altering drugs and alcohol.   Group Time: 2:30-4 pm  Participation Level: Active  Behavioral Response: Appropriate  Type of Therapy: Psycho-education Group  Interventions: Strength-based  Topic: Psychoeducation: A chaplain associated with [facility] led the psychoeducation portion of the group. The session began with a mindful eating exercise. The chaplain asked the group about relevant coping strategies. The group then identified three things they liked about themselves as an exercise in self-compassion. Drug tests were collected from several group members.   Summary: Patient presented as active and engaged in group. She shared that her oldest son (who does not currently live with her) came to a family dinner and initiated an upcoming lunch meeting. She reported being pleased by this change of events, as he has isolated from her in the past and is "always blaming" her. She hopes to begin rebuilding that relationship with her son. When asked to identify things she liked about herself, she reported that she helps her friends, and is a "good mom and a good cook." She attended a new AA meeting, and is considering making it her home group. At the meeting she was able to reconnect with someone she went to treatment with and is planning on getting  lunch with them soon. She reports attending 2 AA meetings.    UDS collected: No Results:   AA/NA attended?: YesTuesday and Wednesday  Sponsor?: Yes   Brandon Melnick, LCAS 02/10/2016 2:17 PM

## 2016-02-10 NOTE — Progress Notes (Signed)
[name] is a 50 y.o. female patient. CD-IOP: Family Counseling. Met with patient and her husband this morning as scheduled. The patient had wanted him to come in and discuss how they could address his desires for her to get a job. When I asked him how she seemed to be doing since she arrived back at home after 37 days at rehab in [place], he reported, "Great, it's the best she has been in at least 3 years". The husband agreed that he is concerned that she might be bored and isolating and he knows that is what she was doing when she was drinking before going to rehab. I explained that the program director felt strongly that she was not ready or even fit to secure full-time employment and that we were encouraging them to wait until after the first of the year to secure employment. In the meantime, was there something she could do for about 12-15 hours per week through the holidays? 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 It was also suggested that she secure a 'whiteboard' at the house and write down her weekly schedule, including meetings, yoga classes, work, CD-IOP and any other appointments or plans she has. This will allow the patient to rebuild her family's trust. The husband seemed to like these suggestions and it was agreed she would not seek full-time or longer employment until January of 2018. She will find something she can spend about 10-12 hours per week on until then. Displaying her accountability to her family, by providing a schedule will reduce any worry or hesitation and rebuild trust. The patient reported she would be picking up her 60 day chip tomorrow and I suggested that her husband appear at the meeting and witness this event. They agreed to discuss this. The patient remains compliant in all aspects of the program. She has recently secured a sponsor and will begin  to work closely with her in the days and weeks ahead. This session went well and both individual' indicated they were pleased and felt satisfied with the decisions made and the direction identified. We will continue to follow in the days ahead. The patient's sobriety date remains 8/15.        Brandon Melnick, LCAS

## 2016-02-10 NOTE — Progress Notes (Signed)
    Daily Group Progress Note  Program: CD-IOP   02/10/2016 [name] [id number]  Diagnosis:  Alcohol use disorder, severe, in early remission, dependence ([place])  Major depressive disorder, recurrent episode, moderate ([place])  Generalized anxiety disorder   Sobriety Date: 12/13/15  Group Time: 1-2:30  Participation Level: Active  Behavioral Response: Appropriate and Sharing  Type of Therapy: Process Group  Interventions: CBT, Motivational Interviewing and Solution Focused  Topic: Counselors met with patients for 1.5 hour group process session with a focus on recovery from drug/alcohol addiction. Patients shared their feelings, thoughts, and behaviors related to recovery. Patients were somewhat inactive and the counselor had to work to remind patients this was "their recovery". Counselors tried to deepen the sharing and openness of members since the group had recently lost a few key talkers. Counselors used open questions and reflection to elicit discussion and sharing.     Group Time: 2:30-4  Participation Level: Active  Behavioral Response: Appropriate and Sharing  Type of Therapy: Psycho-education Group  Interventions: CBT and Solution Focused  Topic: Counselors met with patients for 1.5 hour group psychoeducation session with a focus on relapse prevention skills, triggers, and mindfulness. Patients shared about the issues that could most cause them to drink/use today. Counselor and members shared about strategies to help avoid turning triggering thoughts into cravings. Counselors discussed relapse as a "process not an event".   Summary: Patient presented as active and engaged in group sessions. She reported that she attended 1 AA meeting since last group. She expressed tearfulness when discussing her husband's cancer and conflict with her oldest son. She told the group she had a productive individual couples session with counselor. She was happy to report her husband  was able to better understand why she should not be working full-time during these early days of recovery. Patient stated she hopes to find out about Yoga classes this weekend that she can begin to attend regularly and that she wants to start going to the gym. Patient hopes to have a white board calendar that shows her family what she is doing each week and grows her accountability. 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 Patient seems to be revealing more psychologically and emotionally minded content to the group about her husband's terminal cancer diagnosis and the impact it has on their relationship. Counselor worked to support, validate, and encourage patient. Youlanda Roys, LPC-A   UDS collected: No Results: negative  AA/NA attended?: YesThursday  Sponsor?: Yes   Brandon Melnick, LCAS 02/10/2016 8:44 AM

## 2016-02-13 ENCOUNTER — Other Ambulatory Visit (HOSPITAL_COMMUNITY): Payer: BLUE CROSS/BLUE SHIELD | Admitting: Psychology

## 2016-02-13 DIAGNOSIS — F1021 Alcohol dependence, in remission: Secondary | ICD-10-CM

## 2016-02-13 DIAGNOSIS — F331 Major depressive disorder, recurrent, moderate: Secondary | ICD-10-CM

## 2016-02-14 ENCOUNTER — Encounter (HOSPITAL_COMMUNITY): Payer: Self-pay | Admitting: Psychology

## 2016-02-14 NOTE — Progress Notes (Signed)
[name] is a 50 y.o. female patient. CD-IOP: Individual Counseling Session. Met with the patient this morning for our regularly scheduled weekly individual session. She reported she was worried about her husband. His memory is really bad. They have acknowledged that his memory was clearly affected by the radiation he received about 2 years ago to address the tumors in his brain, but she is now wondering if it is getting worse and maybe portends more cancerous growths? When I asked what she could do to address this, the patient agreed that she would contact his doctor and noted he has an appt with [facility] tomorrow. The patient is having lunch with her oldest son today. He had asked her about a week ago and she is both excited and apprehensive about what he will say to her. He is the angriest of all her family members over her alcoholism and continued use despite treatment at [facility] and many broken promises. WE talked about his anger and the fear that is underlying it. She agreed that she would let him say what he needed to and try not to take it personally. We discussed having her family meet with an addiction counselor so they understand that the drinking is not necessarily a choice but being driven by the disease. The patient also expressed some frustration over the 'marriage contract' that her husband had had her sign when she returned from rehab in [place]. The contract has serious tolerance (no use whatsoever) or she is required to move out and divorce proceedings will commence. Despite having no intention or desire to drink, she still feels bothered by this contract. I reminded her that her family, i.e., 2 sons and husband have lived with her for most of the past 2 years while she was drinking secretly and often times openly and/or drunk. They don't want to live in that uncertainty any longer and if she returns to drinking, there will be no questions about what to do. This is the easiest thing for them and if she  truly intends to remain sober, there should be no problem. This reminder seemed to assuage some of her frustrations. We discussed the patient's sponsor and plans to meet and begin her step work. She will buy a whiteboard this afternoon to chart her family's appointments and there is reduced confusion and she provides them with more accountability on her part. The patient is doing fairly well in early recovery and her sobriety date remains 8/15. She continues to build a daily lifestyle that is incorporating the 12 Steps and living a life of integrity and gratitude.         Brandon Melnick, LCAS

## 2016-02-14 NOTE — Progress Notes (Addendum)
    Daily Group Progress Note  Program: CD-IOP   02/14/2016 [name] [id number]  Diagnosis: Alcohol use disorder, severe in early remission   Sobriety Date: 8/15  Group Time: 1-2:30 pm  Participation Level: Active  Behavioral Response: Appropriate and Sharing  Type of Therapy: Process Group  Interventions: Supportive  Topic: Process: the first half of group was spent in process. Members shared about any struggles or challenges that presented themselves over the weekend. Group members also identified what they had done to support their sobriety and recovery since the group last met. Random drug tests were collected from 5 group members. There were no new sobriety dates offered today.  Group Time: 2:45-4 pm  Participation Level: Active  Behavioral Response: Sharing  Type of Therapy: Psycho-education Group  Interventions: Assertiveness Training  Topic: Psycho-Ed: Communication: Passive, Aggressive, Passive-Aggressive and Assertive. The second half of group was spent in a psycho-ed. The topic was communication and the different types of communication. Handouts were provided and members read and discussed the characteristics of each type. There were good questions and examples offered by group members. Misinterpretations were identified and discussed and a role-play followed which included counselor and group member. There were good questions and members displayed interest and had many questions about this topic.   Summary: The patient was engaged and active in group today. She reported a busy weekend. She had attended 2 AA meetings, including the women's meeting on Saturday and the morning meeting today. When prodded by this counselor, she reported she had picked up her 60 day chip. This news was applauded by all. The patient admitted her husband had sent a text telling her how proud he was as did her oldest son. She reported that she and her husband had attended a fundraiser  for breast cancer and had seen a lot of old friends. "I hugged people, reconnected and then we left". It was pointed out that being around others that are drinking is not necessarily bad or high risk, but she didn't stay long and reported she hadn't even paid attention to whether people were drinking. "it didn't bother me at all", she explained. The patient reported she also read the Big Book over the weekend out on her front porch. The patient was engaged and asked good questions during the psycho-ed. One of her questions included, "can I be aggressive inside and outwardly assertive"? This generated a lively discussion.  Later on in the session she reminded another member that "You can't stay sober if you can't be assertive". The patient displays an acceptance of the chronic nature of her disease as well as a realization that she cannot control alcohol. She has explained to the group that her husband gave her a contract to sign which stipulates any return to use will results in her moving out and a divorce. She is adamant that she will remain sober going forward. She responded well to this intervention.    UDS collected: No Results:   AA/NA attended?: Yes, Monday and Saturday at the [employer]?: Yes   Brandon Melnick, LCAS 02/14/2016 8:31 AM

## 2016-02-15 ENCOUNTER — Other Ambulatory Visit (HOSPITAL_COMMUNITY): Payer: Self-pay | Admitting: Medical

## 2016-02-15 ENCOUNTER — Other Ambulatory Visit (HOSPITAL_COMMUNITY): Payer: BLUE CROSS/BLUE SHIELD

## 2016-02-15 ENCOUNTER — Encounter (HOSPITAL_COMMUNITY): Payer: Self-pay | Admitting: Psychology

## 2016-02-16 ENCOUNTER — Other Ambulatory Visit (HOSPITAL_COMMUNITY): Payer: BLUE CROSS/BLUE SHIELD

## 2016-02-16 ENCOUNTER — Other Ambulatory Visit (HOSPITAL_COMMUNITY): Payer: BLUE CROSS/BLUE SHIELD | Admitting: Psychology

## 2016-02-16 DIAGNOSIS — F1021 Alcohol dependence, in remission: Secondary | ICD-10-CM | POA: Diagnosis not present

## 2016-02-16 NOTE — Progress Notes (Signed)
Tennis ShipSarah B Runner is a 50 y.o. female patient. CD-IOP: Excused Absence. The patient was present for group today, but she had to leave at 2:30 to take her 48 yo son to [place] [facility] [place]. He is having dental problems and her husband could not take him. The patient will not be billed for the group nor will she receive credit for today's session. She will be back for group tomorrow and assured me she would be present for the entire session.         Charmian MuffAnn Shariya Gaster, LCAS

## 2016-02-17 NOTE — Progress Notes (Signed)
    Daily Group Progress Note  Program: CD-IOP   02/17/2016 [name] [id number]  Diagnosis:  Alcohol use disorder, severe, in early remission, dependence [facility])  MDD (major depressive disorder), recurrent episode, moderate ([place])  Generalized anxiety disorder   Sobriety Date: 12/13/15  Group Time: 1-2:30  Participation Level: Active  Behavioral Response: Appropriate and Sharing  Type of Therapy: Process Group  Interventions: CBT, Solution Focused and Assertiveness Training  Topic: Patients met with counselors for 1.5 hour group process session. 2 students were present and observing. No drug tests were collected today. Patients focused on the last 24 hours and what they had done to support their recovery. Patients shared openly with the group and gave and received feedback. Counselor used linking, focusing to draw out more from patients and connect their stories and build trust and support.     Group Time: 2:30-4  Participation Level: Active  Behavioral Response: Appropriate and Sharing  Type of Therapy: Psycho-education Group  Interventions: Solution Focused and Assertiveness Training  Topic: Patients met with counselors for 1.5 hour group psychoeducation session. 2 students were present and observing. Counselor led a session on effectively handling criticism. Patients shared their experiences of criticism and how to avoid debates, defense and instead, view it as a Administrator, arts. Counselors modeled a role play of effective criticism and other members acted out role plays using scenarios from their own life. Patients were active and upbeat during psychoeducation.   Summary: Patient presented as active and engaged in group today. She attended 1 AA meeting this morning. She reported on her recent "mom duties" and the responsibilities she is handling with homecoming and raising her sons. Patient shared openly during check in and remained mostly quiet during  remainder of group. She was able to ask for feedback with how to handle criticism from a fellow AA member who "made the patient feel bad" for being new in [place]. Counselor reframed it as an opportunity to grow, accept criticism and move forward without harm. Patient displayed new insight in her ability to not allow herself to be harmed by others' words. Patient seemed markedly calmer and more present in group, less distracted than previous days. Youlanda Roys, LPC-A   UDS collected: No Results: negative  AA/NA attended?: YesThursday  Sponsor?: Yes   Brandon Melnick, LCAS 02/17/2016 10:28 AM

## 2016-02-20 ENCOUNTER — Other Ambulatory Visit (HOSPITAL_COMMUNITY): Payer: BLUE CROSS/BLUE SHIELD | Admitting: Psychology

## 2016-02-20 DIAGNOSIS — F1021 Alcohol dependence, in remission: Secondary | ICD-10-CM

## 2016-02-21 ENCOUNTER — Encounter (HOSPITAL_COMMUNITY): Payer: Self-pay | Admitting: Psychology
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
## 2016-02-22 ENCOUNTER — Other Ambulatory Visit (HOSPITAL_COMMUNITY): Payer: Self-pay | Admitting: Medical

## 2016-02-22 ENCOUNTER — Other Ambulatory Visit (HOSPITAL_COMMUNITY): Payer: BLUE CROSS/BLUE SHIELD | Admitting: Psychology

## 2016-02-22 DIAGNOSIS — F1021 Alcohol dependence, in remission: Secondary | ICD-10-CM

## 2016-02-22 MED ORDER — OXCARBAZEPINE 300 MG PO TABS: 300.0000 mg | ORAL_TABLET | Freq: Two times a day (BID) | ORAL | 2 refills | Status: DC

## 2016-02-22 MED ORDER — SERTRALINE HCL 100 MG PO TABS
200.0000 mg | ORAL_TABLET | Freq: Every day | ORAL | 2 refills | Status: DC
Start: 2016-02-22 — End: 2018-06-05

## 2016-02-22 MED ORDER — TOPIRAMATE 100 MG PO TABS
100.0000 mg | ORAL_TABLET | Freq: Every day | ORAL | 2 refills | Status: DC
Start: 2016-02-22 — End: 2018-06-05

## 2016-02-23 ENCOUNTER — Other Ambulatory Visit (HOSPITAL_COMMUNITY): Payer: BLUE CROSS/BLUE SHIELD | Admitting: Psychology

## 2016-02-23 DIAGNOSIS — F331 Major depressive disorder, recurrent, moderate: Secondary | ICD-10-CM

## 2016-02-23 DIAGNOSIS — F411 Generalized anxiety disorder: Secondary | ICD-10-CM

## 2016-02-23 DIAGNOSIS — F1021 Alcohol dependence, in remission: Secondary | ICD-10-CM | POA: Diagnosis not present

## 2016-02-23 LAB — TOXASSURE SELECT,+ANTIDEPR,UR

## 2016-02-24 ENCOUNTER — Encounter (HOSPITAL_COMMUNITY): Payer: Self-pay | Admitting: Psychology

## 2016-02-24 NOTE — Progress Notes (Signed)
    Daily Group Progress Note  Program: CD-IOP   02/24/2016 [name] [id number]  Diagnosis:  Alcohol use disorder, severe, in early remission, dependence [facility])  MDD (major depressive disorder), recurrent episode, moderate ([place])  Generalized anxiety disorder   Sobriety Date: 12/13/15  Group Time: 1-2:30  Participation Level: Active  Behavioral Response: Appropriate and Sharing  Type of Therapy: Process Group  Interventions: CBT, Strength-based, Supportive and Reframing  Topic: Patients met with counselors for 1.5 hour process session. Patients discussed their experiences in early recovery, challenges to their sobriety, and steps they are taking to support their abstinence from all mind-altering substances. Patients discussed their feelings and thoughts about yesterday's group session with the chaplain, which stirred up significant emotions for some members. Counselors took one UDS sample from a group member who was not in attendance last session. One patient graduated successfully and a brief ceremony occurred to honor his completion. One patient was confronted about his lack of engagement in group and subsequently left group abruptly while cursing at clinician.     Group Time: 2:30-4  Participation Level: Active  Behavioral Response: Appropriate and Sharing  Type of Therapy: Psycho-education Group  Interventions: CBT and Supportive  Topic: Patients met with counselors for 1.5 hour group psychoeducation session. Counselor led topical discussion on "fear in recovery". Patients shared openly and with vulnerability about their "deepest fear". Many patients discussed fear of rejection, abandonment, and success. Counselor used reflection to deepen patients' experience and linking to help universalize feelings of fear.   Summary: Patient presented as active and engaged during session today. She reported that she is feeling "slightly scared but confident about her upcoming  night alone" since her husband will be away this weekend. Patient stated she does not feel "particularly vulnerable to drinking" since she has planned specific meetings and activities that are recovery related to help her. Patient stated her deepest fear is "being alone" and discussed her husband's past decision to move out during her active addiction. Patient stated she is aware of her fear but tries not to let it be the focus of her day. Patient seems to be increasing her insight into the consequences of her addiction and her desire to reconnect with her family emotionally. Patient seems more confident, stable, and less anxious during sessions. Patient speaks more lucidly and with increased sense of conviction. Counselor used strengthening, encouragement, and deepening to help patient reveal her emotions and thoughts to the group. Counselor will continue to work on helping patient sustain recovery, avoid triggers, and confront resentments based in her addiction. [name], LPC-A   UDS collected: No Results: negative  AA/NA attended?: YesThursday  Sponsor?: Yes   [name], [place] 02/24/2016 11:46 AM

## 2016-02-24 NOTE — Progress Notes (Signed)
    Daily Group Progress Note  Program: CD-IOP   02/24/2016 [name] [id number]  Diagnosis:  No diagnosis found.   Sobriety Date: 8/15  Group Time: 1-2:30 pm  Participation Level: Active  Behavioral Response: Appropriate and Sharing  Type of Therapy: Process Group  Interventions: Supportive  Topic: Process: Counselors met with patients to discuss activities they had engaged in to support their health and wellbeing, particularly in regards to maintaining their sobriety. Several group members met with the program director to discuss medication management and discharge plans. All members were active and engaged in the discussion concerning recovery from mind-altering drugs and alcohol.   Group Time: 2:30-4 pm  Participation Level: Active  Behavioral Response: Sharing  Type of Therapy: Psycho-education Group  Interventions: Strength-based  Topic: Psychoeducation/Graduation: A chaplain connected to [employer] led the psychoeducation portion of the group. Group was focused on letting go to things - particularly in regard to their addiction. In the previous session all patients were asked to write goodbye letters to their addictions. These letters were then shared with the group and led to a fruitful discussion. The session ended with a touching graduation ceremony with a respected group member who was moving onto the next phase of his new life. Kind words of gratitude and encouragement were offered to this departing member.   Summary: The patient presented as active and engaged in group. Since the previous session she had met with her son's youth pastor who is in recovery himself. He was able to encourage her in her spirituality and point out how God has already worked in her life. She explained to the group that one of her treatment goals is to continue to integrate her spirituality and recovery. She was also able to meet with her sponsor. During the first half of group today,  the patient met with the program director to discuss medication management. During the psychoeducational portion of the group she provided helpful feedback to other members. The patient attended 2 AA meetings since this group last met. The patient baked a coconut cake for the graduating member and shared kind words with him during the graduation. She responded well to this intervention.   UDS collected: No Results:   AA/NA attended?: YesTuesday and Wednesday  Sponsor?: Yes   Brandon Melnick, LCAS 02/24/2016 2:11 PM

## 2016-02-26 ENCOUNTER — Encounter (HOSPITAL_COMMUNITY): Payer: Self-pay | Admitting: Psychology

## 2016-02-26 NOTE — Progress Notes (Signed)
    Daily Group Progress Note  Program: CD-IOP   02/26/2016 [name] [id number]  Diagnosis:  No diagnosis found.   Sobriety Date: 8/15  Group Time: 1-2:30 pm  Participation Level: Active  Behavioral Response: Appropriate  Type of Therapy: Process Group  Interventions: Supportive  Topic: Process: The first half of group was spent in process. Members shared about the past weekend, what they had done to support their recovery and any challenges, temptations or realizations that may have occurred since the group last met. The program director met 2 group members during this session.  A new group member was present, and she introduced herself during group today. She received a warm welcome. Random drug tests were collected from 5 group members today.   Group Time: 2:30-4 pm  Participation Level: Active  Behavioral Response: Sharing  Type of Therapy: Psycho-education Group  Interventions: Strength-based  Topic: Psycho-Ed: "Group vs 12 Step Meetings; what is the difference?" The second half of group was spent in a psycho-ed discussing the differences between group counseling and 12-Step meetings. Members were invited to identify what the expectations of AA/NA meetings are as compared to what is expected in group. The differences were identified and the expectations for group were discussed at length and emphasized.   Summary: The patient reported a busy weekend, including having attended 4 AA meetings. She had spoken with her sponsor and had dinner with her oldest son. He remains estranged, but they are beginning to talk and the relationship seems to be improving. The patient provided good feedback to one of the newer members. She had done yard work with her husband yesterday and it had felt good to be home doing 'normal' things. The patient was engaged in the discussion in the psycho-ed. She displayed a good understanding of the differences between group counseling and meetings.  Of course, she has had two experiences with residential treatment and was enrolled in this program approximately 2 years ago. She has had a lot of experience with both of them and clearly understands the boundaries and rules of each. The patient made some good comments and displays a degree of motivation lacking in the past. She responded well to this intervention.    UDS collected: No Results:  AA/NA attended?: [place], Friday, Saturday and Sunday  Sponsor?: Yes   Brandon Melnick, LCAS 02/26/2016 2:38 PM

## 2016-02-27 ENCOUNTER — Other Ambulatory Visit (HOSPITAL_COMMUNITY): Payer: Self-pay | Admitting: Medical

## 2016-02-27 ENCOUNTER — Other Ambulatory Visit (HOSPITAL_COMMUNITY): Payer: BLUE CROSS/BLUE SHIELD | Admitting: Psychology

## 2016-02-27 DIAGNOSIS — F1021 Alcohol dependence, in remission: Secondary | ICD-10-CM | POA: Diagnosis not present

## 2016-02-29 ENCOUNTER — Other Ambulatory Visit (HOSPITAL_COMMUNITY): Payer: BLUE CROSS/BLUE SHIELD | Attending: Psychiatry | Admitting: Psychology

## 2016-02-29 DIAGNOSIS — F19982 Other psychoactive substance use, unspecified with psychoactive substance-induced sleep disorder: Secondary | ICD-10-CM | POA: Insufficient documentation

## 2016-02-29 DIAGNOSIS — Z5189 Encounter for other specified aftercare: Secondary | ICD-10-CM | POA: Diagnosis not present

## 2016-02-29 DIAGNOSIS — F19980 Other psychoactive substance use, unspecified with psychoactive substance-induced anxiety disorder: Secondary | ICD-10-CM | POA: Insufficient documentation

## 2016-02-29 DIAGNOSIS — F1021 Alcohol dependence, in remission: Secondary | ICD-10-CM | POA: Diagnosis not present

## 2016-02-29 DIAGNOSIS — F331 Major depressive disorder, recurrent, moderate: Secondary | ICD-10-CM | POA: Insufficient documentation

## 2016-02-29 DIAGNOSIS — Z79899 Other long term (current) drug therapy: Secondary | ICD-10-CM | POA: Insufficient documentation

## 2016-02-29 DIAGNOSIS — I158 Other secondary hypertension: Secondary | ICD-10-CM | POA: Insufficient documentation

## 2016-02-29 LAB — URINE DRUG SCREEN 701203 - MAT
6-Acetylmorphine: NEGATIVE ng/mL
AMPHETAMINES UR: NEGATIVE ng/mL
Barbiturates: NEGATIVE ng/mL
Benzodiazepines: NEGATIVE ng/mL
Buprenorphine: NEGATIVE ng/mL
COCAINE METABOLITE UR: NEGATIVE ng/mL
CREATININE UR: 83.6 mg/dL (ref >=20)
Carisoprodol: NEGATIVE ng/mL
ETHYL GLUCURONIDE UR: NEGATIVE ng/mL
FENTANYL UR: NEGATIVE ng/mL
GABAPENTIN UR: NEGATIVE ug/mL
MARIJUANA METAB (THC) UR: NEGATIVE ng/mL
METHADONE UR: NEGATIVE ng/mL
NITRITES UR: NEGATIVE ug/mL
OXYCODONE UR: NEGATIVE ng/mL
Opiates: NEGATIVE ng/mL
PHENCYCLIDINE (PCP) UR: NEGATIVE ng/mL
Propoxyphene: NEGATIVE ng/mL
TRAMADOL UR: NEGATIVE ng/mL
Tapentadol: NEGATIVE ng/mL
URINE PH: 7.5 (ref 4.5–8.9)

## 2016-03-01 ENCOUNTER — Other Ambulatory Visit (HOSPITAL_COMMUNITY): Payer: BLUE CROSS/BLUE SHIELD | Admitting: Psychology

## 2016-03-01 DIAGNOSIS — Z5189 Encounter for other specified aftercare: Secondary | ICD-10-CM | POA: Diagnosis not present

## 2016-03-01 DIAGNOSIS — F411 Generalized anxiety disorder: Secondary | ICD-10-CM

## 2016-03-01 DIAGNOSIS — F331 Major depressive disorder, recurrent, moderate: Secondary | ICD-10-CM

## 2016-03-01 DIAGNOSIS — F1021 Alcohol dependence, in remission: Secondary | ICD-10-CM

## 2016-03-02 NOTE — Progress Notes (Signed)
    Daily Group Progress Note  Program: CD-IOP   03/02/2016 [name] [id number]  Diagnosis:  Alcohol use disorder, severe, in early remission, dependence [facility])  MDD (major depressive disorder), recurrent episode, moderate ([place])  Generalized anxiety disorder   Sobriety Date: 12/13/15  Group Time: 1-2:30  Participation Level: Active  Behavioral Response: Appropriate and Sharing  Type of Therapy: Process Group  Interventions: CBT, Strength-based and Supportive  Topic: Patients met with counselors for 1.5 hour group process session. Patients were active and engaged. No drug tests were collected. One member graduated successfully and was honored with a small graduation token and parting words. Counselors used open questions and reflection to elicit exploration of patients' recovery journeys and early sobriety. Patients responded significantly to one member's stories of "feeling excluded" and experiencing prejudice and harassment at work.     Group Time: 2:30-4  Participation Level: Active  Behavioral Response: Appropriate and Sharing  Type of Therapy: Psycho-education Group  Interventions: CBT, Solution Focused and Family Systems  Topic: Patients met with counselors for 1.5 hour group psychoeducation session. Patients were active and engaged during the "family sculpture" psychodrama activity. One member shared his family sculpture and allowed other members to comment and deepen his understanding of his family history. [name] story was traumatic and raised many emotions in the room. Group members shared their feelings of pain and sympathy towards him. Patient responded well and seemed happy to share the situation with others "for the first time in his life".   Summary: Patient presented as active and engaged during session. She stated that she attended 1 AA meeting that morning since our last group. She helped another group member at that meeting, by introducing him to  a potential sponsor. Patient stated she was anticipating a relaxing weekend in the [place] with her spouse. Patient continues to bake cakes for graduates of the group. Patient seems to accept her alcoholism, her dedication to 12 step attendance is strong, and her insight into her addiction problem is growing, as displayed by her increased emotional awareness, ability to reframe her problems as "opportunities", and taking multiple perspectives on distressing events in her life. Patient offered helpful and blunt feedback to another group member who was rationalizing his "people pleasing" behavior. Youlanda Roys, LPC-A   UDS collected: No Results: negative  AA/NA attended?: YesThursday  Sponsor?: Yes   Brandon Melnick, LCAS 03/02/2016 8:26 AM

## 2016-03-05 ENCOUNTER — Other Ambulatory Visit (HOSPITAL_COMMUNITY): Payer: BLUE CROSS/BLUE SHIELD | Admitting: Psychology

## 2016-03-05 ENCOUNTER — Encounter (HOSPITAL_COMMUNITY): Payer: Self-pay | Admitting: Psychology

## 2016-03-05 DIAGNOSIS — Z5189 Encounter for other specified aftercare: Secondary | ICD-10-CM | POA: Diagnosis not present

## 2016-03-05 DIAGNOSIS — F1021 Alcohol dependence, in remission: Secondary | ICD-10-CM

## 2016-03-05 DIAGNOSIS — F411 Generalized anxiety disorder: Secondary | ICD-10-CM

## 2016-03-05 DIAGNOSIS — F331 Major depressive disorder, recurrent, moderate: Secondary | ICD-10-CM
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
## 2016-03-05 NOTE — Progress Notes (Signed)
    Daily Group Progress Note  Program: CD-IOP   03/05/2016 [name] [id number]  Diagnosis:  No diagnosis found.   Sobriety Date: 8/15  Group Time: 1-2:30 pm  Participation Level: Active  Behavioral Response: Appropriate and Sharing  Type of Therapy: Process Group  Interventions: Supportive  Topic: Process: Counselors met with patients to discuss recovery-related activities they had engaged in to support their sobriety. A new patient introduced herself to the group and shared reasons for pursuing treatment. All members were active and engaged in the discussion concerning recovery from mind-altering drugs and alcohol.   Group Time: 2:30-4 pm  Participation Level: Active  Behavioral Response: Appropriate and Sharing  Type of Therapy: Psycho-education Group  Interventions: Strength-based  Topic: Psychoeducation: The counselors began the session by leading a ten-minute mindful coloring exercise. The group continued the discussion on the "I Am From" poems from the previous session. During the final part of the session the counselors introduced the idea of a family sculpture. One group member was able to sculpt their family and discuss the activity with the group. Two group members met with the program director to discuss intake/discharge plans. Drug tests were collected from several group members.   Summary: The patient presented as active and engaged in the group. The patient reported she had attended 2 AA meetings since we last met. She shared various errands she had done since the previous session. As it had been Halloween the night before, she realized that this was the first time in at least ten years that she had been sober for the holiday. She continues to report good relationships with family and friends while in recovery. During the psychoeducation portion, the patient participated in another group member's family sculpture. She provided helpful feedback to the  sculpting group member and responded well to this intervention.    UDS collected: No Results:   AA/NA attended?: YesTuesday and Wednesday  Sponsor?: Yes   Brandon Melnick, LCAS 03/05/2016 7:53 AM

## 2016-03-06 ENCOUNTER — Encounter (HOSPITAL_COMMUNITY): Payer: Self-pay | Admitting: Psychology

## 2016-03-06 NOTE — Progress Notes (Signed)
Daily Group Progress Note  Program: CD-IOP   03/06/2016 [name] [id number]  Diagnosis:  No diagnosis found.   Sobriety Date: 8/15  Group Time: 1-2:30 pm  Participation Level: Active  Behavioral Response: Appropriate and Sharing  Type of Therapy: Process Group  Interventions: Supportive  Topic: Process: the first half of group was spent in process. Members shared about the past weekend and any challenges or temptations that may have arisen to threaten their sobriety. No one reported a return to use over the weekend. Drug tests were collected from 3 members and the program director met with the graduating member to review his discharge plans.   Group Time: 2:30-4 pm  Participation Level: Active  Behavioral Response: Sharing  Type of Therapy: Psycho-education Group  Interventions: Strength-based  Topic: Psycho-Ed: Agricultural consultant; Sexually Transmitted diseases and how to be a healthy sexual being/Graduation. The second half of group was spent in a psycho-ed on sexually transmitted diseases and how to avoid them. The conversation also touched on identifying how you want to live you intimate or sexual life. There were some challenging questions from group members and important information provided by the speaker. As the session neared the end, the psycho-ed ended and a graduation ceremony was held honoring a member who was graduating successfully from the program today. Brownies and kind words were shared during this graduation.     Summary: The patient reported a busy and fulfilling weekend. She had met an old friend for lunch on Friday and they had sat and talked for quite a while. It had felt good to share with this old friend of hers. She attended 2 meetings at the [facility] and went to the Sunday women's speaker meeting at 4 pm while the men in her home went to the [place] game in [place]. The patient and her husband had spent [place] the mountains.  They had gone to [employer] in order to spend some time together away from the kids and just to be together. She shared that on Sunday; she had cleaned out her nightstand and underneath her collection of recovery books, found an unopened mini-bottle of vodka. The patient reported she had calmly opened it and poured it down the sink. She had taken a photo and sent it to her husband at work. She admitted that drinking the vodka had not entered her mind and she was pleased by that. The patient was excited to inform the group that her oldest son is going to move back home next week. He had called her this weekend and told her how much he had enjoyed the weekend down at [facility] in [place], [place] and how excited he is to be going there this fall for college. The patient reported she was very happy that he is moving back in and how happy she is that he is happy. During the psycho-ed, the patient noted that she had her husband had been best friends for almost 5 years before they began dating. She agreed with the guest speaker that having been best friends before they fell in love was a good thing. She had not hesitation or reluctance about how her husband and she had finally gotten together. The patient was engaged and provided helpful feedback to her fellow group members. She displayed a good understanding of the way AA works as evidenced by her comments and encouragement towards the newest group member. She continues to make measurable progress in recovery. The patient  responded well to this intervention.    UDS collected: No Results:   AA/NA attended?: [place], Friday and Sunday  Sponsor?: Yes   Brandon Melnick, LCAS 03/06/2016 2:21 PM

## 2016-03-07 ENCOUNTER — Other Ambulatory Visit (HOSPITAL_COMMUNITY): Payer: Self-pay | Admitting: Medical

## 2016-03-07 ENCOUNTER — Other Ambulatory Visit (HOSPITAL_COMMUNITY): Payer: BLUE CROSS/BLUE SHIELD | Admitting: Psychology

## 2016-03-07 ENCOUNTER — Encounter (HOSPITAL_COMMUNITY): Payer: Self-pay | Admitting: Psychology

## 2016-03-07 DIAGNOSIS — F331 Major depressive disorder, recurrent, moderate: Secondary | ICD-10-CM

## 2016-03-07 DIAGNOSIS — Z5189 Encounter for other specified aftercare: Secondary | ICD-10-CM | POA: Diagnosis not present

## 2016-03-07 DIAGNOSIS — F1021 Alcohol dependence, in remission: Secondary | ICD-10-CM

## 2016-03-08 ENCOUNTER — Other Ambulatory Visit (HOSPITAL_COMMUNITY): Payer: BLUE CROSS/BLUE SHIELD | Admitting: Psychology

## 2016-03-08 DIAGNOSIS — F331 Major depressive disorder, recurrent, moderate: Secondary | ICD-10-CM

## 2016-03-08 DIAGNOSIS — F1021 Alcohol dependence, in remission: Secondary | ICD-10-CM

## 2016-03-08 DIAGNOSIS — Z5189 Encounter for other specified aftercare: Secondary | ICD-10-CM | POA: Diagnosis not present

## 2016-03-08 DIAGNOSIS — F411 Generalized anxiety disorder: Secondary | ICD-10-CM

## 2016-03-09 NOTE — Progress Notes (Signed)
    Daily Group Progress Note  Program: CD-IOP   03/09/2016 [name] [id number]  Diagnosis:  Alcohol use disorder, severe, in early remission, dependence [facility])  MDD (major depressive disorder), recurrent episode, moderate ([place])  Generalized anxiety disorder   Sobriety Date: 12/13/15  Group Time: 1-2:30  Participation Level: Active  Behavioral Response: Appropriate and Sharing  Type of Therapy: Process Group  Interventions: Solution Focused and Strength-based  Topic: Patients met with counselors for 1.5 hour group process session. Patients were active and engaged. They took turns reporting on their thoughts and feelings in the last 24 hours since our last group meeting. Some reported significant conflicts with friends and loved ones. Some reported on AA/NA attendance. Counselors used open questions, solution focused techniques, and reflection to deepen patients' connections with each other, build support, and grow understanding of recovery life.     Group Time: 2:30-4  Participation Level: Active  Behavioral Response: Appropriate and Sharing  Type of Therapy: Psycho-education Group  Interventions: Family Systems and Other: psychodrama  Topic: Patients met with counselors for 1.5 hour group psychoeducation session. Patients were active and engaged. Counselors led psychodrama activity entitled "family sculpture". Patients used group members to model their family of origin and describe their childhood to other group members. Counselors used reflection and linking to deepen the groups' feelings of empathy and understanding for past trauma and stress from childhood. Patients reported feeling "relieved and enlightened" after doing the sculptures.   Summary: Patient was active, engaged and supportive of others today in session. She stated attending her standard AA meeting this morning. She presented as calm and clear with her verbalizations. She was happy to report that her  "family seems to be coming back together" since her son is planning to move back in, since she is doing well in her recovery. She reported she went to a social lunch with friends from [place] and was able to grow her intimacy and trust of other women in [place]. Patient did not share her family sculpture, possibly due to her previous hx in tx in CD-IOP. Patient asked clarifying questions about "people pleasing versus codependency". Counselor gave her feedback and also asked for other members to contribute on the topic. Patient verbalized understanding of the difference and was happy that she was "not as codependent as she previously thought". Patient offered support and feedback to other members who shared feelings of hurt, pain, sadness, and fear. [name], LPCA   UDS collected: No Results: negative  AA/NA attended?: YesThursday  Sponsor?: Yes   [name], LCAS 03/09/2016 12:38 PM

## 2016-03-11 ENCOUNTER — Encounter (HOSPITAL_COMMUNITY): Payer: Self-pay | Admitting: Psychology

## 2016-03-11 NOTE — Progress Notes (Signed)
    Daily Group Progress Note  Program: CD-IOP   03/11/2016 [name] [id number]  Diagnosis:  No diagnosis found.   Sobriety Date: 8/15  Group Time: 1-2:30 pm  Participation Level: Active  Behavioral Response: Sharing  Type of Therapy: Process Group  Interventions: Supportive  Topic:  Process: Counselors met with patients to discuss recovery-related activities they had engaged in to support their sobriety. A new patient introduced himself to the group and shared reasons for pursuing treatment. All members were active and engaged in the discussion concerning recovery from mind-altering drugs and alcohol. During group today the Investment banker, operational met with a number of members to discuss medication.   Group Time: 2:30-4 pm  Participation Level: Active  Behavioral Response: Sharing  Type of Therapy: Psycho-education Group  Interventions: Systems analyst  Topic: Psychoeducation: A Clinical biochemist from [employer] met with the group to discuss issues related to relational intimacy in recovery. Intimacy was discussed in the context of family, friends, sponsors, and partners. The counselor led the discussion regarding boundaries or walls that group members use to protect themselves, as well as the pros and cons of doing so. Drug tests were collected from two group members.    Summary: The patient presented as active and engaged in group. She shared that her son is doing well after oral surgery. In one of the meetings she had attended someone was drunk - this person had been one of the first people she had met in [place]. She is planning to begin working part-time on Friday. She is currently planning how to have boundaries with her family members over the holidays. She had shared that during her active addition she felt as though she had a "roommate who played golf all the time" rather than a husband. During her recovery she wants to continue focusing on regularly socializing with others. A  drug test was collected from the patient. The patient was able to attend 3 AA meetings since we last met.    UDS collected: No Results:  AA/NA attended?: [place], Tuesday and Wednesday  Sponsor?: Yes   Brandon Melnick, LCAS 03/11/2016 3:13 PM

## 2016-03-11 NOTE — Progress Notes (Signed)
[name] is a 50 y.o. female patient. CD-IOP: Individual Counseling Session. Met with the patient as scheduled this morning at 8:45. We have met at this time in order to allow her to leave and attend the 10 am [facility] meeting not far from here. However, we met this week on Wednesday instead of Tuesday because of some dental surgery her 50 yo had yesterday morning. The patient reported the surgery had gone well and he was doing fine. She had attended the women's AA meeting last night which is her home group. The patient admitted she has only met with her sponsor once and I wondered whether she needed to find someone that was more active and available than her current sponsor. She admitted she has not completed Step 1 yet and she is scheduled to pick up her 90 day chip this Sunday. I pointed out that its' time for her to be working these steps and she was given the Step 1 handout. The foundational importance of this step was reiterated and the patient agreed to begin addressing the handout. We discussed the importance of recognizing and fully accepting one's 'powerlessness' and 'unmanageability' when she drinks. If she fully accepts this then when her disease starts talking about 'just one drink' she will have a quick and powerful response against that. The patient agreed to speak with her sponsor about setting up a consistent schedule to meet every week. The patient asked about her discharge and who would be managing her medications when she graduates. She has agreed that her PCP cannot effectively manage her mediations and needs someone with more knowledge of brain chemistry. The patient reported that she preferred not to continue with the psychiatrist here at [facility] she had worked with previously. A consent was signed and a referral will be faxed to [facility] and an NP there by the name of Thayer Headings. The patient continues to make excellent progress in her recovery and remains  compliant in every aspect of the program. To address the goal of her spiritual strength and development, the patient has a routine that she observes every morning where some of the AA literature is read along with a Christian-based thought for the day. The patient will be graduating in the near future. She responded well to this intervention. Her sobriety date remains 8/15.        Brandon Melnick, LCAS

## 2016-03-12 ENCOUNTER — Other Ambulatory Visit (HOSPITAL_COMMUNITY): Payer: BLUE CROSS/BLUE SHIELD | Admitting: Psychology

## 2016-03-12 ENCOUNTER — Encounter (HOSPITAL_COMMUNITY): Payer: Self-pay | Admitting: Psychology

## 2016-03-12 DIAGNOSIS — F1021 Alcohol dependence, in remission: Secondary | ICD-10-CM

## 2016-03-12 DIAGNOSIS — Z5189 Encounter for other specified aftercare: Secondary | ICD-10-CM | POA: Diagnosis not present

## 2016-03-12 DIAGNOSIS — F411 Generalized anxiety disorder: Secondary | ICD-10-CM

## 2016-03-12 DIAGNOSIS — F331 Major depressive disorder, recurrent, moderate: Secondary | ICD-10-CM

## 2016-03-12 LAB — URINE DRUG SCREEN 701203 - MAT
6-Acetylmorphine: NEGATIVE ng/mL
Amphetamines: NEGATIVE ng/mL
BARBITURATES UR: NEGATIVE ng/mL
Benzodiazepines: NEGATIVE ng/mL
Buprenorphine: NEGATIVE ng/mL
COCAINE METABOLITE UR: NEGATIVE ng/mL
Carisoprodol: NEGATIVE ng/mL
Creatinine: 127.4 mg/dL (ref >=20)
Ethyl Glucuronide: NEGATIVE ng/mL
Fentanyl: NEGATIVE ng/mL
GABAPENTIN UR: NEGATIVE ug/mL
MARIJUANA METAB (THC) UR: NEGATIVE ng/mL
METHADONE UR: NEGATIVE ng/mL
NITRITES UR: NEGATIVE ug/mL
Opiates: NEGATIVE ng/mL
Oxycodone: NEGATIVE ng/mL
PHENCYCLIDINE (PCP) UR: NEGATIVE ng/mL
PROPOXYPHENE UR: NEGATIVE ng/mL
TRAMADOL UR: NEGATIVE ng/mL
Tapentadol: NEGATIVE ng/mL
URINE PH: 5.7 (ref 4.5–8.9)

## 2016-03-13 NOTE — Progress Notes (Signed)
    Daily Group Progress Note  Program: CD-IOP   03/13/2016 [name] [id number]  Diagnosis:  Alcohol use disorder, severe, in early remission, dependence [facility])  MDD (major depressive disorder), recurrent episode, moderate ([place])  Generalized anxiety disorder   Sobriety Date: 12/13/15  Group Time: 1-2:30  Participation Level: Active  Behavioral Response: Appropriate and Sharing  Type of Therapy: Process Group  Interventions: Solution Focused and Strength-based  Topic: Patients met with counselors and intern for 1.5-hour group process session. Members were active and engaged in session, describing the recent events, thoughts, feelings, and behaviors since the last group. UDS tests were collected from some members. Counselor began session with 5-minute breathing exercise to increase awareness and practice of mindfulness. One patient became upset when pressed to "reveal his pain" and told the group he will "not talk anymore today". Counselors and intern used motivational interviewing and immediacy to help patients share from their present experience.     Group Time: 2:30-4  Participation Level: Active  Behavioral Response: Appropriate and Sharing  Type of Therapy: Psycho-education Group  Interventions: Counsellor and Other: five love languages  Topic: Counselors and intern led psychoeducation session on "The Five Love Languages" by [employer]. Counselors taught strategies for effectively communicating a love language and discussed ways to increase patient's intimacy with close friends and loved ones. Patients were given a 10-minute assessment to discover their primary love language. All patients were engaged and shared thoughtfully with each other about intimacy and growing connection with others.    Summary: Patient was active and engaged in group today. She presented as upbeat and focused in her check-in. She reported attending 3 AA meetings since last  group. 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 She was pleasantly surprised that her return to work was "very smooth" and did not cause her stress. She was happy to report that her son moved back in after leaving a few months ago due to her alcoholism. Patient plans to graduate successfully next group and has shown good progress on her treatment goals and objectives. Patient stated her primary love language was quality time. [name], LPC-A   UDS collected: No Results: negative  AA/NA attended?: YesMonday  Sponsor?: Yes   Brandon Melnick, LCAS 03/13/2016 4:36 PM

## 2016-03-14 ENCOUNTER — Other Ambulatory Visit (HOSPITAL_BASED_OUTPATIENT_CLINIC_OR_DEPARTMENT_OTHER): Payer: BLUE CROSS/BLUE SHIELD | Admitting: Psychology

## 2016-03-14 ENCOUNTER — Encounter (HOSPITAL_COMMUNITY): Payer: Self-pay | Admitting: Medical

## 2016-03-14 DIAGNOSIS — I158 Other secondary hypertension: Secondary | ICD-10-CM

## 2016-03-14 DIAGNOSIS — Z79899 Other long term (current) drug therapy: Secondary | ICD-10-CM

## 2016-03-14 DIAGNOSIS — F19980 Other psychoactive substance use, unspecified with psychoactive substance-induced anxiety disorder: Secondary | ICD-10-CM | POA: Diagnosis not present

## 2016-03-14 DIAGNOSIS — F1021 Alcohol dependence, in remission: Secondary | ICD-10-CM | POA: Diagnosis not present

## 2016-03-14 DIAGNOSIS — Z5189 Encounter for other specified aftercare: Secondary | ICD-10-CM | POA: Diagnosis not present

## 2016-03-14 DIAGNOSIS — F19982 Other psychoactive substance use, unspecified with psychoactive substance-induced sleep disorder: Secondary | ICD-10-CM | POA: Diagnosis not present

## 2016-03-14 DIAGNOSIS — T50905A Adverse effect of unspecified drugs, medicaments and biological substances, initial encounter: Secondary | ICD-10-CM

## 2016-03-14 DIAGNOSIS — F331 Major depressive disorder, recurrent, moderate: Secondary | ICD-10-CM

## 2016-03-14 MED ORDER — TRAZODONE HCL 100 MG PO TABS: 200.0000 mg | ORAL_TABLET | Freq: Every day | ORAL | 2 refills | Status: DC

## 2016-03-14 MED ORDER — BUPROPION HCL ER (XL) 300 MG PO TB24: 300.0000 mg | ORAL_TABLET | Freq: Every day | ORAL | 2 refills | Status: DC

## 2016-03-14 NOTE — Progress Notes (Signed)
  [facility] Intensive Outpatient Discharge Summary   Tennis ShipSarah B Deetz [id number]  Date of Admission:01/19/2016 Date of Discharge:03/15/2016  Course of Treatment: Patient successfully completed program for 2nd time without reservation of the chronic progressive nature of her alcoholism and its cost in her life and her family's life. She has become active in AA with a sponsor;home group and the practice of the 12 Steps in her daily living. Pt was admitted this time on referral from [facility] [place] with Dual Diagnoses of Severe dependent alcohol use disorder and MDD recurrent Moderate.   Medications: Current Medications:        Current Outpatient Prescriptions  Medication Sig Dispense Refill   . buPROPion (WELLBUTRIN XL) 300 MG 24 hr tablet      . Oxcarbazepine (TRILEPTAL) 300 MG tablet Take 300 mg by mouth 2 (two) times daily.  2   . sertraline (ZOLOFT) 100 MG tablet Take 200 mg by mouth daily.  2   . topiramate (TOPAMAX) 100 MG tablet TAKE ONE & ONE-HALF TABLET AT BEDTIME  2   . traZODone (DESYREL) 100 MG tablet Take 2 tablets (200 mg total) by mouth at bedtime. 60 tablet 2      Visit Diagnoses:      ICD-9-CM ICD-10-CM                   1.   Alcohol use disorder, severe, in early remission, dependence (HCC) 303.93 F10.21       2.   MDD (major depressive disorder), recurrent episode, moderate (HCC) 296.32 F33.1       3.   Substance-induced sleep disorder (HCC) 292.85 F19.982       4.   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 F19.980       5.   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            Goals and Activities to Help Maintain Sobriety: 1. Stay away from old friends who continue to drink and use mind-altering chemicals. 2. Continue practicing Fair Fighting rules in interpersonal conflicts. 3. Continue alcohol and drug refusal skills and call on support  systems. 4. Continue to learn and practice the 12 steps in daily living/One Day at a Time 5.   Participate in Family program for alcoholism especially [place]  Referrals: Continue AA -seek outside help if needed  Aftercare services[facility] 5:30 [facility] 1. Attend AA meetings at least 4 times per week. 2. Continue with a sponsor and a home group in AA/NA. Return to Psychotherapist: as needed  Next appointment: November for FU and [facility]      Plan of Action to Address Continuing Problems: As above   Client has participated in the development of this discharge plan and has received a copy of this completed plan  Maryjean MornCharles Christorpher Hisaw  03/14/2016   Maryjean Mornharles Mieke Brinley, PA-C 03/14/2016

## 2016-03-15 ENCOUNTER — Other Ambulatory Visit (HOSPITAL_COMMUNITY): Payer: BLUE CROSS/BLUE SHIELD | Admitting: Psychology

## 2016-03-15 DIAGNOSIS — F1021 Alcohol dependence, in remission: Secondary | ICD-10-CM

## 2016-03-20 ENCOUNTER — Encounter (HOSPITAL_COMMUNITY): Payer: Self-pay | Admitting: Psychology

## 2016-03-20 ENCOUNTER — Ambulatory Visit (INDEPENDENT_AMBULATORY_CARE_PROVIDER_SITE_OTHER): Payer: BLUE CROSS/BLUE SHIELD | Admitting: Licensed Clinical Social Worker

## 2016-03-20 DIAGNOSIS — F1021 Alcohol dependence, in remission: Secondary | ICD-10-CM

## 2016-03-20 DIAGNOSIS — F331 Major depressive disorder, recurrent, moderate: Secondary | ICD-10-CM

## 2016-03-20 NOTE — Progress Notes (Signed)
    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 Two new patients introduced themselves to the group and shared reasons for pursuing treatment. All members were active and engaged in the discussion concerning recovery from mind-altering drugs and alcohol.  Group Time: 2:30-4 pm  Participation Level: Active  Behavioral Response: Appropriate  Type of Therapy: Psycho-education Group  Interventions: Strength-based  Topic: Psychoeducation: Counselors led a discussion on the [employer] and how it related to recovery. Patients were asked to list things they had control over verses things they did not and share them with the group. A group member met with the program director to discuss discharge procedures. Counselors concluded the group by discussing holiday scheduling and the importance of prioritizing recovery.     Summary: The patient presented as active and engaged in group. She shared that since the previous session she had worked several shifts at her part-time job. Her husband had been invited to share at the [facility] and she shared with the group about the experience. It was very emotional and she was very proud of him. This disclosure was the most detailed explanation of her husband's illness that she had provided in group. She shared that her use escalated  after his diagnosis so she could "drink the pain away." He was diagnosed just over two years ago with Stage 4 lung cancer. He had been extremely healthy and had never smoked. It had been huge blow to everyone and although he is responding well to this new round of medication, the long-term prognosis is bleak. The patient admitted she is "focusing on the positive" rather than dwelling in the fear of the unknown. The group encouraged her to continue reaching out for support. The counselors encouraged her to continue broadening her social network. The patient stated she could not change that she is an alcoholic nor can she change the fact that her husband has cancer. She can change how she deals with her chronic disease and the attention she pays to the everyday things in life that we often take for granted. The patient attended one [place] meeting since we last met. She responded well to this intervention and has progressed measurably since she entered this program. She is scheduled to graduate tomorrow.   UDS collected: No Results:  AA/NA attended?: Yes, Wednesday  Sponsor?: Yes   Brandon Melnick, LCAS 03/20/2016 9:53 AM

## 2016-03-20 NOTE — Progress Notes (Signed)
    Daily Group Progress Note  Program: CD-IOP   03/20/2016 [name] [id number]  Diagnosis:  No diagnosis found.  Sobriety Date: 8/15  Group Time: 1-2:30 pm  Participation Level: Active  Behavioral Response: Appropriate and Sharing  Type of Therapy: Process Group  Interventions: Supportive  Topic: Process: The first half of group was spent in process. Members discussed what they are doing to support their recovery and any challenges or obstacles that may have appeared since we last met. A new group member was present today and she introduced herself during this half of group.   Group Time: 2:30-4 pm  Participation Level: Active  Behavioral Response: Sharing  Type of Therapy: Activity/Graduation  Interventions: Solution Focused  Topic: Psycho-Ed; Emotional Jenga/Graduation: the second half of group was spent in a psycho-ed. The topic was "Emotional Jenga" and consisted of group members circling their chairs around a table with the [place] tower in the middle. Members chose the pieces they drew and were asked to use them in a sentence and how they would address the emotion in recovery. There was good feedback and fun in this exercise. A graduation ceremony was held 20 minutes prior to the end of the session. Members enjoyed brownies and spoke to the graduating member. Her husband had arrived and was also present for this ceremony. She received validating loving compliments and wishes, and the ceremony proved very poignant.    Summary: The patient reported she had attended 1 AA meeting since we last met. She had worked yesterday, and it had gone well. 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 Her husband had wanted her to have something to do and she agreed that a few hours per week would be good and keep her busy and focused. The patient admitted she was nervous about leaving the group, but agreed it was time for her  to move on. The patient was engaged in the [place] session and provided good responses were she to experience the emotions she drew on the [place] pieces. During the graduation, the patient's husband was present, and she was teary as members recounted her kindness and encouragement she had given then when they first arrived. The patient expressed gratitude towards all her group members and encouraged them to finish the program and commit to sobriety. The patient has made measurable progress in this program, including a sponsor and step-work, and she displays an acceptance of her alcoholism to a degree she had not presented in the past. The patient has as clear understanding of her daily recovery needs and all the tools she will need to remain abstinent going forward. She responded well to this intervention.    UDS collected: No Results:   AA/NA attended?: YesThursday  Sponsor?: Yes   [name], [name] 03/20/2016 10:31 AM

## 2016-03-21 ENCOUNTER — Encounter (HOSPITAL_COMMUNITY): Payer: Self-pay | Admitting: Licensed Clinical Social Worker

## 2016-03-21 ENCOUNTER — Encounter: Payer: Self-pay | Admitting: *Deleted

## 2016-03-21 NOTE — Progress Notes (Signed)
Daily Group Progress Note  Program:  Aftercare   Group Time: 5:15-6:15 pm  Participation Level: Active  Behavioral Response: Appropriate  Type of Therapy:  Psychoeducation/Therapy  Summary of Progress: Today was pt's first day in Aftercare. She introduced herself to the group and shared what exacerbated her alcohol use to addiction. Pt participated in a discussion on relationships in recovery. Pt completed an activity on "The Relationship Circle": intimate, close, and acquaintances. A discussion ensued on the placement in the circle of people allowed in her life. The discussion continued where patient showed how some people move around in different circles so that she may protect herself. Pt was encouraged to continue to use boundaries with people in her life for healthy self-care. Pt was receptive to the intervention.    Vernona RiegerLisbeth S. Priyansh Pry, LCAS 03/20/16 \\

## 2016-03-27 ENCOUNTER — Encounter (HOSPITAL_COMMUNITY): Payer: Self-pay | Admitting: Licensed Clinical Social Worker

## 2016-03-27 ENCOUNTER — Ambulatory Visit (INDEPENDENT_AMBULATORY_CARE_PROVIDER_SITE_OTHER): Payer: BLUE CROSS/BLUE SHIELD | Admitting: Licensed Clinical Social Worker

## 2016-03-27 DIAGNOSIS — F1021 Alcohol dependence, in remission: Secondary | ICD-10-CM

## 2016-03-27 NOTE — Progress Notes (Signed)
Daily Group Progress Note  Program:  Aftercare   Group Time: 5:15-6:15 pm  Participation Level: Active  Behavioral Response: Appropriate  Type of Therapy:  Psychoeducation/Therapy Summary of Progress: Pt participated in a discussion on the importance of self-care in early recovery. Pt presented tired and responded she was tired. Pt historically has put her family first, which has resulted in her continued drinking.  Pt was encouraged to put herself first and practice self-care: appropriate sleep, relaxation, fun, meditation, meetings, sponsor and step work, spirituality. Pt was receptive to the intervention.   Vernona RiegerLisbeth S. Rakesh Dutko, LCAS

## 2016-04-03 ENCOUNTER — Ambulatory Visit (HOSPITAL_COMMUNITY): Payer: Self-pay | Admitting: Licensed Clinical Social Worker

## 2016-04-10 ENCOUNTER — Ambulatory Visit (HOSPITAL_COMMUNITY): Payer: Self-pay | Admitting: Licensed Clinical Social Worker

## 2016-04-17 ENCOUNTER — Ambulatory Visit (HOSPITAL_COMMUNITY): Payer: Self-pay | Admitting: Licensed Clinical Social Worker

## 2016-05-01 ENCOUNTER — Ambulatory Visit (HOSPITAL_COMMUNITY): Payer: Self-pay | Admitting: Licensed Clinical Social Worker

## 2016-05-23 ENCOUNTER — Other Ambulatory Visit (HOSPITAL_COMMUNITY): Payer: Self-pay | Admitting: Medical

## 2016-09-19 ENCOUNTER — Other Ambulatory Visit: Payer: Self-pay | Admitting: Nurse Practitioner

## 2016-09-19 DIAGNOSIS — Z1231 Encounter for screening mammogram for malignant neoplasm of breast: Secondary | ICD-10-CM

## 2017-01-17 ENCOUNTER — Other Ambulatory Visit: Payer: Self-pay | Admitting: Nurse Practitioner

## 2017-01-17 DIAGNOSIS — N941 Unspecified dyspareunia: Secondary | ICD-10-CM

## 2017-12-13 ENCOUNTER — Other Ambulatory Visit: Payer: Self-pay | Admitting: Nurse Practitioner

## 2017-12-13 DIAGNOSIS — Z1231 Encounter for screening mammogram for malignant neoplasm of breast: Secondary | ICD-10-CM

## 2018-02-03 ENCOUNTER — Telehealth: Payer: Self-pay | Admitting: Psychiatry

## 2018-02-03 ENCOUNTER — Other Ambulatory Visit: Payer: Self-pay

## 2018-02-03 MED ORDER — BUPROPION HCL ER (XL) 300 MG PO TB24: ORAL_TABLET | ORAL | 1 refills | Status: DC

## 2018-02-03 NOTE — Telephone Encounter (Signed)
Patient received Wellbutrin 150mg  but not [facility].  Please clarify dosage

## 2018-02-03 NOTE — Telephone Encounter (Signed)
[facility]

## 2018-02-03 NOTE — Telephone Encounter (Signed)
Spoke with pt let her know she's to take 150mg  and 300mg . Will contact [facility] for clarification.

## 2018-02-10 ENCOUNTER — Encounter: Payer: Self-pay | Admitting: Emergency Medicine

## 2018-02-10 DIAGNOSIS — F1021 Alcohol dependence, in remission: Secondary | ICD-10-CM

## 2018-02-10 DIAGNOSIS — F419 Anxiety disorder, unspecified: Secondary | ICD-10-CM

## 2018-02-10 DIAGNOSIS — F4323 Adjustment disorder with mixed anxiety and depressed mood: Secondary | ICD-10-CM | POA: Insufficient documentation

## 2018-02-19 ENCOUNTER — Ambulatory Visit: Payer: BLUE CROSS/BLUE SHIELD | Admitting: Psychiatry

## 2018-02-19 ENCOUNTER — Ambulatory Visit (INDEPENDENT_AMBULATORY_CARE_PROVIDER_SITE_OTHER): Payer: BLUE CROSS/BLUE SHIELD | Admitting: Psychiatry

## 2018-02-19 ENCOUNTER — Encounter: Payer: Self-pay | Admitting: Psychiatry

## 2018-02-19 VITALS — BP 92/62 | HR 76

## 2018-02-19 DIAGNOSIS — F5101 Primary insomnia: Secondary | ICD-10-CM

## 2018-02-19 DIAGNOSIS — F419 Anxiety disorder, unspecified: Secondary | ICD-10-CM

## 2018-02-19 DIAGNOSIS — F1021 Alcohol dependence, in remission: Secondary | ICD-10-CM

## 2018-02-19 DIAGNOSIS — F3289 Other specified depressive episodes: Secondary | ICD-10-CM

## 2018-02-19 MED ORDER — BREXPIPRAZOLE 2 MG PO TABS: 2.0000 mg | ORAL_TABLET | Freq: Every morning | ORAL | 0 refills | Status: DC

## 2018-02-19 NOTE — Progress Notes (Signed)
[name] [id number] Dec 09, 1965 52 y.o.  Subjective:   Patient ID:  [name] is a 52 y.o. (DOB November 14, 1965) female.  Chief Complaint:  Chief Complaint  Patient presents with  . Depression  . Anxiety  . Insomnia    HPI [name] presents to the office today for follow-up of depression, anxiety, and insomnia. Reports recent water leak that caused extensive damage and will need to re-do the bathroom and the room below. Had to find a storage unit and movers to move the furniture. Also has other home repairs that were already scheduled. She reports "I was doing fine, but last night I just kind of lost it." She reports that "nothing has been normal for 7 months." Reports multiple stressors since husband's death with home repairs, issues with vehicles, etc. Reports missing her old house and neighbors. Describes feeling "overwhelmed." She reports that she thinks that Rexulti is helping some- "I felt a little bit better, but it hasn't really shown." Reports some improvement in sad mood. Reports it is difficult to assess energy and motivation due to recent events and has not noticed a significant improvement. Reports that she has had more anxiety in recent weeks. Has noticed a few times where she has had some physical sensations, similar to dizziness, with anxiety similar to what she has had in the past with severe anxiety. Notices increased muscle tension and now has a knot in her neck.  Denies any recent panic attacks. She reports some irritability and has been snapping at others occasionally. She reports poor sleep recently and awakens multiple times a night and will check her e-mail and weather in the middle of the night. Denies difficulty falling asleep. Estimates sleeping about 6-7 hours a night and typically needs 9-10 hours of sleep a night. Appetite has been decreased and "what I eat is not what I should be eating." She reports that she is able to focus but has difficulty with concentration.  Notices that there have been a few situations where she has been able to think quickly on her feet and come up with ideas and solutions. Denies SI. Denies any cravings to drink.   Reports that she has an upcoming appointment with hospice. This past weekend was 6 month anniversary of husband's death. Reports that a friend recently died of cancer and she was unable to go to the funeral because she was concerned that she would cry excessively and disrupt the service.  Past medication trials: Lexapro-does not recall significant response Sertraline Mirtazapine Wellbutrin Topamax Trileptal Seroquel Hydroxyzine BuSpar Trazodone   Medications: I have reviewed the patient's current medications.  Current Outpatient Medications  Medication Sig Dispense Refill  . Brexpiprazole (REXULTI) 1 MG TABS Take 1 mg by mouth every morning.    Marland Kitchen buPROPion (WELLBUTRIN XL) 150 MG 24 hr tablet Take 150 mg by mouth every morning.    Marland Kitchen buPROPion (WELLBUTRIN XL) 300 MG 24 hr tablet Take 1 daily with 150mg  to = 450mg /daily 90 tablet 1  . Melatonin 10 MG TABS Take by mouth.    . Multiple Vitamins-Minerals (MULTIVITAMIN WITH MINERALS) tablet Take 1 tablet by mouth daily.    . naproxen sodium (ALEVE) 220 MG tablet Take 220 mg by mouth daily as needed.    . Oxcarbazepine (TRILEPTAL) 300 MG tablet Take 1 tablet (300 mg total) by mouth 2 (two) times daily. 60 tablet 2  . sertraline (ZOLOFT) 100 MG tablet Take 2 tablets (200 mg total) by mouth daily. 60 tablet  2  . topiramate (TOPAMAX) 50 MG tablet Take 100 mg by mouth at bedtime.     . traZODone (DESYREL) 150 MG tablet Take 300 mg by mouth at bedtime.     . Brexpiprazole (REXULTI) 2 MG TABS Take 2 mg by mouth every morning for 14 days. 14 tablet 0  . Brexpiprazole (REXULTI) 2 MG TABS Take 2 mg by mouth every morning. 30 tablet 0  . hydrOXYzine (ATARAX/VISTARIL) 25 MG tablet Take 25 mg by mouth every 6 (six) hours as needed for anxiety.    . topiramate (TOPAMAX) 100 MG  tablet Take 1 tablet (100 mg total) by mouth at bedtime. 30 tablet 2   No current facility-administered medications for this visit.     Medication Side Effects: None  Allergies:  Allergies  Allergen Reactions  . Erythromycin     Stomach  cramps    Past Medical History:  Diagnosis Date  . Alcohol dependence (HCC)   . Allergy   . Depression   . Headache   . HTN (hypertension)     Family History  Problem Relation Age of Onset  . Cancer Mother 72       breast  . Depression Mother   . Heart disease Mother   . Macular degeneration Mother   . Diabetes Maternal Grandmother   . Heart disease Maternal Grandmother   . Stroke Maternal Grandmother   . Alcohol abuse Sister   . Depression Sister   . Alcohol abuse Paternal Grandmother   . Cancer Father   . COPD Father   . Depression Brother   . Early death Neg Hx     Social History   Socioeconomic History  . Marital status: Married    Spouse name: Not on file  . Number of children: Not on file  . Years of education: Not on file  . Highest education level: Not on file  Occupational History  . Not on file  Social Needs  . Financial resource strain: Not on file  . Food insecurity:    Worry: Not on file    Inability: Not on file  . Transportation needs:    Medical: Not on file    Non-medical: Not on file  Tobacco Use  . Smoking status: Never Smoker  . Smokeless tobacco: Never Used  Substance and Sexual Activity  . Alcohol use: Not Currently    Alcohol/week: 0.0 standard drinks  . Drug use: No  . Sexual activity: Yes    Birth control/protection: Surgical  Lifestyle  . Physical activity:    Days per week: Not on file    Minutes per session: Not on file  . Stress: Not on file  Relationships  . Social connections:    Talks on phone: Not on file    Gets together: Not on file    Attends religious service: Not on file    Active member of club or organization: Not on file    Attends meetings of clubs or  organizations: Not on file    Relationship status: Not on file  . Intimate partner violence:    Fear of current or ex partner: Not on file    Emotionally abused: Not on file    Physically abused: Not on file    Forced sexual activity: Not on file  Other Topics Concern  . Not on file  Social History Narrative  . Not on file    Past Medical History, Surgical history, Social history, and Family history were reviewed  and updated as appropriate.   Please see review of systems for further details on the patient's review from today.   Review of Systems:  Review of Systems  Musculoskeletal: Positive for arthralgias and myalgias.  Neurological: Positive for headaches.  Psychiatric/Behavioral: Positive for decreased concentration and dysphoric mood. Negative for suicidal ideas.    Objective:   Physical Exam:  BP 92/62   Pulse 76   Physical Exam  Constitutional: She is oriented to person, place, and time. She appears well-developed. No distress.  Musculoskeletal: She exhibits no deformity.  Neurological: She is alert and oriented to person, place, and time. Coordination normal.  Psychiatric: Her speech is normal and behavior is normal. Judgment and thought content normal. Her mood appears anxious. Her affect is not angry, not blunt, not labile and not inappropriate. Cognition and memory are normal. She exhibits a depressed mood. She expresses no homicidal and no suicidal ideation. She expresses no suicidal plans and no homicidal plans.  Tearful at times    Lab Review:     Component Value Date/Time   NA 138 11/24/2015 2200   K 3.8 11/24/2015 2200   CL 106 11/24/2015 2200   CO2 21 (L) 11/24/2015 2200   GLUCOSE 90 11/24/2015 2200   BUN 14 11/24/2015 2200   CREATININE 0.71 11/24/2015 2200   CALCIUM 9.3 11/24/2015 2200   PROT 7.5 11/24/2015 2200   ALBUMIN 4.0 11/24/2015 2200   AST 91 (H) 11/24/2015 2200   ALT 61 (H) 11/24/2015 2200   ALKPHOS 93 11/24/2015 2200   BILITOT 0.9  11/24/2015 2200   GFRNONAA >60 11/24/2015 2200   GFRAA >60 11/24/2015 2200       Component Value Date/Time   WBC 9.8 11/24/2015 2200   RBC 3.78 (L) 11/24/2015 2200   HGB 12.0 11/24/2015 2200   HCT 35.7 (L) 11/24/2015 2200   PLT 282 11/24/2015 2200   MCV 94.4 11/24/2015 2200   MCH 31.7 11/24/2015 2200   MCHC 33.6 11/24/2015 2200   RDW 12.8 11/24/2015 2200    No results found for: POCLITH, LITHIUM   No results found for: PHENYTOIN, PHENOBARB, VALPROATE, CBMZ   .res Assessment: Plan:   Pt is seen for 30 minutes and 50% of session spent counseling pt to include providing emotional support and validation re: grief and recent stressors.  Discussed utilizing family support, particularly with problem solving regarding stressors due to household repairs.  Also counseled pt re: potential benefits, risks, and side effects of increasing to Rexulti 2 mg po qd to improve mood and anxiety.  Agreed with plan to see hospice counselor.  Encourage patient to notify the office if she decides she would also like to see a therapist and a referral could be made.  Patient to follow-up with this provider in 3 to 4 weeks or sooner if clinically indicated. Will increase Rexulti to 2 mg daily to improve mood and anxiety Will continue sertraline 200 mg daily for mood and anxiety Will continue Wellbutrin XL 450 mg in the morning for mood and concentration. Will continue Topamax 100 mg at bedtime for anxiety and insomnia. Will continue Trileptal 300 mg twice daily for mood and anxiety. Will continue trazodone 150 mg 1-2 tabs p.o. nightly for insomnia.   Other depression - Plan: Brexpiprazole (REXULTI) 2 MG TABS, Brexpiprazole (REXULTI) 2 MG TABS  Anxiety - Plan: Brexpiprazole (REXULTI) 2 MG TABS, Brexpiprazole (REXULTI) 2 MG TABS  Primary insomnia  Alcohol dependence in remission [facility])  Please see After Visit Summary for  patient specific instructions.  Future Appointments  Date Time Provider Department  Center  03/17/2018 12:30 PM Corie Chiquito, PMHNP [facility] None    No orders of the defined types were placed in this encounter.     -------------------------------

## 2018-03-17 ENCOUNTER — Ambulatory Visit: Payer: BLUE CROSS/BLUE SHIELD | Admitting: Psychiatry

## 2018-03-17 ENCOUNTER — Telehealth: Payer: Self-pay | Admitting: Psychiatry

## 2018-03-17 NOTE — Telephone Encounter (Signed)
Stated going to another doctor this morning she is sick. Stated you would speak with her over the phone

## 2018-03-20 ENCOUNTER — Other Ambulatory Visit: Payer: Self-pay

## 2018-03-20 ENCOUNTER — Encounter: Payer: Self-pay | Admitting: Nurse Practitioner

## 2018-03-20 ENCOUNTER — Other Ambulatory Visit: Payer: Self-pay | Admitting: Psychiatry

## 2018-03-20 DIAGNOSIS — F419 Anxiety disorder, unspecified: Secondary | ICD-10-CM

## 2018-03-20 DIAGNOSIS — F3289 Other specified depressive episodes: Secondary | ICD-10-CM

## 2018-03-20 MED ORDER — BREXPIPRAZOLE 2 MG PO TABS: 2.0000 mg | ORAL_TABLET | Freq: Every morning | ORAL | 1 refills | Status: DC

## 2018-03-20 NOTE — Telephone Encounter (Signed)
Pt aware.

## 2018-03-20 NOTE — Telephone Encounter (Signed)
Pt stated she is doing fine. Feels the higher dose of Rexulti is giving good results. She feels an improvement in her mood. Inquired about her next refill.

## 2018-03-20 NOTE — Progress Notes (Signed)
Please let patient know that prescription for Rexulti 2 mg was sent to pharmacy.  Please advise her to contact office if she has any difficulties with getting prescription filled i.e. having to wait on a prior authorization, and samples could be provided.

## 2018-03-21 ENCOUNTER — Other Ambulatory Visit: Payer: Self-pay

## 2018-05-23 ENCOUNTER — Encounter (HOSPITAL_COMMUNITY): Payer: Self-pay | Admitting: Psychology

## 2018-06-05 ENCOUNTER — Encounter: Payer: Self-pay | Admitting: Psychiatry

## 2018-06-05 ENCOUNTER — Ambulatory Visit (INDEPENDENT_AMBULATORY_CARE_PROVIDER_SITE_OTHER): Payer: BLUE CROSS/BLUE SHIELD | Admitting: Psychiatry

## 2018-06-05 VITALS — BP 106/79 | HR 78

## 2018-06-05 DIAGNOSIS — F5101 Primary insomnia: Secondary | ICD-10-CM

## 2018-06-05 DIAGNOSIS — F419 Anxiety disorder, unspecified: Secondary | ICD-10-CM | POA: Diagnosis not present

## 2018-06-05 DIAGNOSIS — F1021 Alcohol dependence, in remission: Secondary | ICD-10-CM | POA: Diagnosis not present

## 2018-06-05 DIAGNOSIS — F3289 Other specified depressive episodes: Secondary | ICD-10-CM | POA: Diagnosis not present

## 2018-06-05 MED ORDER — TOPIRAMATE 100 MG PO TABS: 100.0000 mg | ORAL_TABLET | Freq: Every day | ORAL | 5 refills | Status: DC

## 2018-06-05 MED ORDER — BUPROPION HCL ER (XL) 150 MG PO TB24: 150.0000 mg | ORAL_TABLET | ORAL | 5 refills | Status: DC

## 2018-06-05 MED ORDER — BUPROPION HCL ER (XL) 300 MG PO TB24: ORAL_TABLET | ORAL | 1 refills | Status: DC

## 2018-06-05 MED ORDER — OXCARBAZEPINE 300 MG PO TABS: 300.0000 mg | ORAL_TABLET | Freq: Two times a day (BID) | ORAL | 3 refills | Status: DC

## 2018-06-05 MED ORDER — BREXPIPRAZOLE 3 MG PO TABS: 3.0000 mg | ORAL_TABLET | Freq: Every day | ORAL | 1 refills | Status: DC

## 2018-06-05 MED ORDER — SERTRALINE HCL 100 MG PO TABS: 200.0000 mg | ORAL_TABLET | Freq: Every day | ORAL | 2 refills | Status: DC

## 2018-06-05 MED ORDER — HYDROXYZINE HCL 25 MG PO TABS: 25.0000 mg | ORAL_TABLET | Freq: Four times a day (QID) | ORAL | 5 refills | Status: DC | PRN

## 2018-06-05 MED ORDER — TRAZODONE HCL 150 MG PO TABS: 300.0000 mg | ORAL_TABLET | Freq: Every day | ORAL | 5 refills | Status: DC

## 2018-06-05 NOTE — Progress Notes (Signed)
[name] [id number] [birth year] 53 y.o.  Subjective:   Patient ID:  Melinda ShipSarah B Kendrick is a 53 y.o. (DOB [birth year]) female.  Chief Complaint:  Chief Complaint  Patient presents with  . Depression  . Follow-up    h/o Depression    HPI [name] presents to the office today for follow-up of depression. "I've been really depressed." She reports that her depression has been debilitating. She reports that her motivation has been very low. Energy has also been low overall. Reports occasionally having energy "in spurts" and will then cook foods. Reports that she had increased grief around the holidays and thinking about things that her husband would normally would do. She reports some days have been "ok" and has been getting out periodically. Has been spending time with friends, going to [employer] meetings, and going to a [employer] party. Reports that she has been applying for jobs. Reports that she has been sleeping more than usual. Appetite has been "ok" and is "up and down." She reports that anxiety has been ok.   Denies SI.   Past medication trials: Lexapro-does not recall significant response Sertraline- Taken for 2.5 years.  Mirtazapine Wellbutrin Rexulti Topamax Trileptal Seroquel Hydroxyzine BuSpar Trazodone  Review of Systems:  Review of Systems  Eyes:       Occasional eye twitch  Musculoskeletal: Positive for arthralgias. Negative for gait problem.  Neurological: Negative for tremors.  Psychiatric/Behavioral:       Please refer to HPI    Medications: I have reviewed the patient's current medications.  Current Outpatient Medications  Medication Sig Dispense Refill  . Melatonin 10 MG TABS Take by mouth.    . Multiple Vitamins-Minerals (MULTIVITAMIN WITH MINERALS) tablet Take 1 tablet by mouth daily.    . naproxen sodium (ALEVE) 220 MG tablet Take 220 mg by mouth daily as needed.    . Brexpiprazole (REXULTI) 3 MG TABS Take 3 mg by mouth daily for 30 days. 30 tablet 1  .  buPROPion (WELLBUTRIN XL) 150 MG 24 hr tablet Take 1 tablet (150 mg total) by mouth every morning for 30 days. Take with 300 mg to equal total dose of 450 mg 30 tablet 5  . buPROPion (WELLBUTRIN XL) 300 MG 24 hr tablet Take 1 daily with 150mg  to = 450mg /daily 90 tablet 1  . hydrOXYzine (ATARAX/VISTARIL) 25 MG tablet Take 1 tablet (25 mg total) by mouth every 6 (six) hours as needed for up to 30 days for anxiety. 90 tablet 5  . Oxcarbazepine (TRILEPTAL) 300 MG tablet Take 1 tablet (300 mg total) by mouth 2 (two) times daily. 60 tablet 3  . sertraline (ZOLOFT) 100 MG tablet Take 2 tablets (200 mg total) by mouth daily. 60 tablet 2  . topiramate (TOPAMAX) 100 MG tablet Take 1 tablet (100 mg total) by mouth at bedtime for 30 days. 30 tablet 5  . traZODone (DESYREL) 150 MG tablet Take 2 tablets (300 mg total) by mouth at bedtime for 30 days. 60 tablet 5   No current facility-administered medications for this visit.     Medication Side Effects: None  Allergies:  Allergies  Allergen Reactions  . Erythromycin     Stomach  cramps    Past Medical History:  Diagnosis Date  . Alcohol dependence (HCC)   . Allergy   . Depression   . Headache   . HTN (hypertension)     Family History  Problem Relation Age of Onset  . Cancer Mother [id number]  breast  . Depression Mother   . Heart disease Mother   . Macular degeneration Mother   . Diabetes Maternal Grandmother   . Heart disease Maternal Grandmother   . Stroke Maternal Grandmother   . Alcohol abuse Sister   . Depression Sister   . Alcohol abuse Paternal Grandmother   . Cancer Father   . COPD Father   . Depression Brother   . Early death Neg Hx     Social History   Socioeconomic History  . Marital status: Married    Spouse name: Not on file  . Number of children: Not on file  . Years of education: Not on file  . Highest education level: Not on file  Occupational History  . Not on file  Social Needs  . Financial resource strain:  Not on file  . Food insecurity:    Worry: Not on file    Inability: Not on file  . Transportation needs:    Medical: Not on file    Non-medical: Not on file  Tobacco Use  . Smoking status: Never Smoker  . Smokeless tobacco: Never Used  Substance and Sexual Activity  . Alcohol use: Not Currently    Alcohol/week: 0.0 standard drinks  . Drug use: No  . Sexual activity: Yes    Birth control/protection: Surgical  Lifestyle  . Physical activity:    Days per week: Not on file    Minutes per session: Not on file  . Stress: Not on file  Relationships  . Social connections:    Talks on phone: Not on file    Gets together: Not on file    Attends religious service: Not on file    Active member of club or organization: Not on file    Attends meetings of clubs or organizations: Not on file    Relationship status: Not on file  . Intimate partner violence:    Fear of current or ex partner: Not on file    Emotionally abused: Not on file    Physically abused: Not on file    Forced sexual activity: Not on file  Other Topics Concern  . Not on file  Social History Narrative  . Not on file    Past Medical History, Surgical history, Social history, and Family history were reviewed and updated as appropriate.   Please see review of systems for further details on the patient's review from today.   Objective:   Physical Exam:  BP 106/79   Pulse 78   Physical Exam Constitutional:      General: She is not in acute distress.    Appearance: She is well-developed.  Musculoskeletal:        General: No deformity.  Neurological:     Mental Status: She is alert and oriented to person, place, and time.     Coordination: Coordination normal.  Psychiatric:        Attention and Perception: Attention and perception normal. She does not perceive auditory or visual hallucinations.        Mood and Affect: Mood is depressed. Mood is not anxious. Affect is blunt. Affect is not labile, angry or  inappropriate.        Speech: Speech normal.        Behavior: Behavior normal.        Thought Content: Thought content normal. Thought content does not include homicidal or suicidal ideation. Thought content does not include homicidal or suicidal plan.  Cognition and Memory: Cognition and memory normal.        Judgment: Judgment normal.     Comments: Insight intact. No delusions.      Lab Review:     Component Value Date/Time   NA 138 11/24/2015 2200   K 3.8 11/24/2015 2200   CL 106 11/24/2015 2200   CO2 21 (L) 11/24/2015 2200   GLUCOSE 90 11/24/2015 2200   BUN 14 11/24/2015 2200   CREATININE 0.71 11/24/2015 2200   CALCIUM 9.3 11/24/2015 2200   PROT 7.5 11/24/2015 2200   ALBUMIN 4.0 11/24/2015 2200   AST 91 (H) 11/24/2015 2200   ALT 61 (H) 11/24/2015 2200   ALKPHOS 93 11/24/2015 2200   BILITOT 0.9 11/24/2015 2200   GFRNONAA >60 11/24/2015 2200   GFRAA >60 11/24/2015 2200       Component Value Date/Time   WBC 9.8 11/24/2015 2200   RBC 3.78 (L) 11/24/2015 2200   HGB 12.0 11/24/2015 2200   HCT 35.7 (L) 11/24/2015 2200   PLT 282 11/24/2015 2200   MCV 94.4 11/24/2015 2200   MCH 31.7 11/24/2015 2200   MCHC 33.6 11/24/2015 2200   RDW 12.8 11/24/2015 2200    No results found for: POCLITH, LITHIUM   No results found for: PHENYTOIN, PHENOBARB, VALPROATE, CBMZ   .res Assessment: Plan:   Case staffed with Dr. Jennelle Humanottle.  Will increase Rexulti to 3 mg po qd to improve mood s/s.discussed potential benefits, risks, and side effects of increasing Rexulti and patient agrees to increase dose.  Discussed considering switching sertraline to another antidepressant if no improvement with increased dose of Rexulti, and may consider trial of Trintellix. Continue sertraline 200 mg daily for anxiety and depression. Continue Wellbutrin XL 450 mg daily for depression. Continue Topamax 100 mg p.o. nightly for mood and anxiety. Continue Trileptal 300 mg twice daily. Continue trazodone  300 mg at bedtime for insomnia. Alcohol dependence in remission (HCC)  Other depression - Plan: Brexpiprazole (REXULTI) 3 MG TABS, buPROPion (WELLBUTRIN XL) 300 MG 24 hr tablet, sertraline (ZOLOFT) 100 MG tablet, buPROPion (WELLBUTRIN XL) 150 MG 24 hr tablet  Anxiety - Plan: Oxcarbazepine (TRILEPTAL) 300 MG tablet, sertraline (ZOLOFT) 100 MG tablet, hydrOXYzine (ATARAX/VISTARIL) 25 MG tablet, topiramate (TOPAMAX) 100 MG tablet  Primary insomnia - Plan: Oxcarbazepine (TRILEPTAL) 300 MG tablet, traZODone (DESYREL) 150 MG tablet, topiramate (TOPAMAX) 100 MG tablet  Please see After Visit Summary for patient specific instructions.  Future Appointments  Date Time Provider Department Center  06/30/2018  2:15 PM Corie Chiquitoarter, Maddyson Keil, PMHNP [facility] None    No orders of the defined types were placed in this encounter.     -------------------------------

## 2018-06-10 ENCOUNTER — Telehealth: Payer: Self-pay | Admitting: Psychiatry

## 2018-06-10 NOTE — Telephone Encounter (Signed)
Submitted 06/05/2018 to pharmacy and received

## 2018-06-10 NOTE — Telephone Encounter (Signed)
Please call patient concerning the Topiramate you prescribed. She stated pharmacy doesn't have it listed as you discussed. Please clarify for her.

## 2018-06-10 NOTE — Telephone Encounter (Signed)
Verified with pharmacy they do have rx on file, pt just picked up her 50mg  dosage so insurance won't cover the 100mg  yet. Instructed pt to double up on her 50mg  dose then when getting low contact the pharmacy to get the 100mg .

## 2018-06-17 ENCOUNTER — Other Ambulatory Visit: Payer: Self-pay | Admitting: Psychiatry

## 2018-06-17 DIAGNOSIS — F419 Anxiety disorder, unspecified: Secondary | ICD-10-CM

## 2018-06-17 DIAGNOSIS — F3289 Other specified depressive episodes: Secondary | ICD-10-CM

## 2018-06-30 ENCOUNTER — Ambulatory Visit: Payer: BLUE CROSS/BLUE SHIELD | Admitting: Psychiatry

## 2018-07-15 ENCOUNTER — Other Ambulatory Visit: Payer: Self-pay | Admitting: Psychiatry

## 2018-07-15 DIAGNOSIS — F3289 Other specified depressive episodes: Secondary | ICD-10-CM

## 2018-07-30 ENCOUNTER — Other Ambulatory Visit: Payer: Self-pay | Admitting: Psychiatry

## 2018-07-30 DIAGNOSIS — F419 Anxiety disorder, unspecified: Secondary | ICD-10-CM

## 2018-07-30 DIAGNOSIS — F3289 Other specified depressive episodes: Secondary | ICD-10-CM

## 2018-08-17 ENCOUNTER — Other Ambulatory Visit: Payer: Self-pay | Admitting: Psychiatry

## 2018-08-17 DIAGNOSIS — F3289 Other specified depressive episodes: Secondary | ICD-10-CM

## 2018-08-17 DIAGNOSIS — F419 Anxiety disorder, unspecified: Secondary | ICD-10-CM

## 2018-08-23 ENCOUNTER — Other Ambulatory Visit: Payer: Self-pay | Admitting: Psychiatry

## 2018-08-23 DIAGNOSIS — F3289 Other specified depressive episodes: Secondary | ICD-10-CM

## 2018-09-03 NOTE — Progress Notes (Signed)
Orientation to CD-IOP: The patient is a 53 yo married, white, female referred to the CD-IOP. To address her alcohol dependence, she recently completed 37 days of residential treatment at [facility] in [place]. The patient returned home on Wednesday, September 20 and met on the 21st for her orientation and to begin this program. She lives in [place] with her husband and two sons, ages 73 and 33. While she has been drinking for years, the patient's problem with alcohol only became obvious over the past 2 years. In spring of 2015, the patient's husband was diagnosed with Stage 4 lung cancer. He has never smoked and appears to have observed excellent self-care over the years. This diagnosis proved devastating to the patient and her drinking escalated markedly. She entered and completed this program in late September of 2015. She relapsed and returned to heavy use, but followed up with residential treatment at [facility]. Periods of sobriety mixed with a return to use have been the pattern for months. Finally, the patient agreed to seek treatment in [place] in mid-August. Now that she is back, her family members have also returned to the home. Her husband has had his attorney draft details of an agreement that will require her to leave the home and culminate in separation and divorce should she return to drinking. In addition to her alcoholism, the patient is begin treated for depression and anxiety. Both conditions have presented themselves in the past few years. When asked about her childhood, the patient reported a happy and fulfilling one. She is a twin and her twin sister is also alcoholic and lives in [place]. She has two older brothers, one of whom lives in [place] while the other remains here in [place]. The patient's parents also live here in [place]. The patient met her husband-to-be in middle-school and they dated and later married after college. They have lived in [place] for their  entire married life. This patient's family life has been an affluent, socially-active one with many friends, summers at the country club, golf and entertaining. But between the patient's drinking and her husband's illness, their social lives have changed dramatically. While the patient's shame and guilt were prominent in early recovery, today, she displayed a commitment and motivation to sobriety previously absent. The patient reported her treatment experience at [facility] had been very helpful and she had been able to discuss issues that had previously been unaddressed. The patient emphasized returning to [place] and securing a sponsor quickly. She has good support within her family and community. The documentation was gathered, reviewed and the orientation completed accordingly. She will return this afternoon to begin the CD-IOP. Her sobriety date is 8/15.

## 2018-11-18 ENCOUNTER — Other Ambulatory Visit: Payer: Self-pay | Admitting: Psychiatry

## 2018-11-18 DIAGNOSIS — F5101 Primary insomnia: Secondary | ICD-10-CM

## 2018-11-18 DIAGNOSIS — F419 Anxiety disorder, unspecified: Secondary | ICD-10-CM

## 2019-02-26 ENCOUNTER — Other Ambulatory Visit: Payer: Self-pay

## 2019-02-26 ENCOUNTER — Ambulatory Visit (INDEPENDENT_AMBULATORY_CARE_PROVIDER_SITE_OTHER): Payer: BC Managed Care – PPO | Admitting: Psychiatry

## 2019-02-26 ENCOUNTER — Encounter: Payer: Self-pay | Admitting: Psychiatry

## 2019-02-26 DIAGNOSIS — F5101 Primary insomnia: Secondary | ICD-10-CM

## 2019-02-26 DIAGNOSIS — F419 Anxiety disorder, unspecified: Secondary | ICD-10-CM | POA: Diagnosis not present

## 2019-02-26 DIAGNOSIS — F3289 Other specified depressive episodes: Secondary | ICD-10-CM | POA: Diagnosis not present

## 2019-02-26 DIAGNOSIS — F1021 Alcohol dependence, in remission: Secondary | ICD-10-CM

## 2019-02-26 MED ORDER — DOXEPIN HCL 10 MG PO CAPS: 10.0000 mg | ORAL_CAPSULE | Freq: Every day | ORAL | 1 refills | Status: DC

## 2019-02-26 MED ORDER — SERTRALINE HCL 100 MG PO TABS: 150.0000 mg | ORAL_TABLET | Freq: Every day | ORAL | 1 refills | Status: DC

## 2019-02-26 MED ORDER — TOPIRAMATE 100 MG PO TABS: 150.0000 mg | ORAL_TABLET | Freq: Every day | ORAL | 1 refills | Status: DC

## 2019-02-26 MED ORDER — GABAPENTIN 600 MG PO TABS: 600.0000 mg | ORAL_TABLET | Freq: Every day | ORAL | 1 refills | Status: DC

## 2019-02-26 MED ORDER — CLONIDINE HCL 0.1 MG PO TABS: 0.1000 mg | ORAL_TABLET | Freq: Two times a day (BID) | ORAL | 1 refills | Status: DC | PRN

## 2019-02-26 MED ORDER — OLANZAPINE 5 MG PO TABS: 5.0000 mg | ORAL_TABLET | Freq: Every day | ORAL | 1 refills | Status: DC

## 2019-02-26 NOTE — Progress Notes (Signed)
Melinda BRERETON [id number] Jun 16, 1965 53 y.o.  Subjective:   Patient ID:  [name] is a 53 y.o. (DOB 05-05-1965) female.  Chief Complaint:  Chief Complaint  Patient presents with  . Anxiety  . Alcohol Problem  . Depression  . Insomnia    HPI Melinda FRONCZAK presents to the office today for follow-up of depression, anxiety, insomnia, and h/o ETOH dependence. She reports that she relapsed and started drinking in early July and drank for a few months. Reports that she stopped medication before relapse.  Son discovered her drinking in late July and he moved out of her home. She reports that she continued to drink for another month or so and went to rehab in September. She returned to [facility] rehab and has been home almost 4 weeks. Her son came back home to stay. Has been going to AA. Denies cravings to drink.  During rehab sertraline was decreased to 100 mg daily;  Wellbutrin and Trileptal were discontinued; she was started on Zyprexa 5 mg at bedtime and doxepin 10 mg p.o. nightly and gabapentin 600 mg nightly and clonidine 0.1 mg as needed for anxiety.  She was continued on Topamax 150 mg daily.  She reports that she was prescribed hydroxyzine but does not find this to be effective for her anxiety.  She reports that she feels that she has not fully grieved the loss of her husband.  Reports experiencing some depression.  She reports that she has had significant anxiety since coming home from rehab. She has been having a rash and is unsure if this is stress related.  She reports that she often will fill nervous and "uneasy."  She reports occasionally feeling jittery and having increased heart rate.  She reports frequent awakenings since returning home. Estimates sleeping 7-8 hours, fragmented. Appetite fair. She reports energy and motivation is ok and seems to wax and wane. Concentration is adequate. Denies SI.   Reports that she has been applying for jobs and trying to stay connected with social  supports.  Past medication trials: Lexapro-does not recall significant response Sertraline- Taken for 2.5 years.  Mirtazapine Wellbutrin Rexulti Topamax Trileptal Gabapentin Seroquel Hydroxyzine-ineffective BuSpar Trazodone Doxepin- Reports this was helpful when she was in rehab Clonidine- Helpful for anxiety  Review of Systems:  Review of Systems  Skin: Positive for rash.       Itching. Reports that lesions will erupt and then crust over.     Medications: I have reviewed the patient's current medications.  Current Outpatient Medications  Medication Sig Dispense Refill  . gabapentin (NEURONTIN) 600 MG tablet Take 1 tablet (600 mg total) by mouth at bedtime. 30 tablet 1  . Melatonin 10 MG TABS Take by mouth.    . Multiple Vitamins-Minerals (MULTIVITAMIN WITH MINERALS) tablet Take 1 tablet by mouth daily.    Marland Kitchen OLANZapine (ZYPREXA) 5 MG tablet Take 1 tablet (5 mg total) by mouth at bedtime. 30 tablet 1  . sertraline (ZOLOFT) 100 MG tablet Take 1.5 tablets (150 mg total) by mouth daily. 45 tablet 1  . cloNIDine (CATAPRES) 0.1 MG tablet Take 1 tablet (0.1 mg total) by mouth 2 (two) times daily as needed. 60 tablet 1  . doxepin (SINEQUAN) 10 MG capsule Take 1 capsule (10 mg total) by mouth at bedtime. 30 capsule 1  . naproxen sodium (ALEVE) 220 MG tablet Take 220 mg by mouth daily as needed.    . topiramate (TOPAMAX) 100 MG tablet Take 1.5 tablets (150 mg  total) by mouth at bedtime. 45 tablet 1   No current facility-administered medications for this visit.     Medication Side Effects: None  Allergies:  Allergies  Allergen Reactions  . Erythromycin     Stomach  cramps    Past Medical History:  Diagnosis Date  . Alcohol dependence ([place])   . Allergy   . Depression   . Headache   . HTN (hypertension)     Family History  Problem Relation Age of Onset  . Cancer Mother 105       breast  . Depression Mother   . Heart disease Mother   . Macular degeneration Mother    . Diabetes Maternal Grandmother   . Heart disease Maternal Grandmother   . Stroke Maternal Grandmother   . Alcohol abuse Sister   . Depression Sister   . Alcohol abuse Paternal Grandmother   . Cancer Father   . COPD Father   . Depression Brother   . Early death Neg Hx     Social History   Socioeconomic History  . Marital status: Married    Spouse name: Not on file  . Number of children: Not on file  . Years of education: Not on file  . Highest education level: Not on file  Occupational History  . Not on file  Social Needs  . Financial resource strain: Not on file  . Food insecurity    Worry: Not on file    Inability: Not on file  . Transportation needs    Medical: Not on file    Non-medical: Not on file  Tobacco Use  . Smoking status: Never Smoker  . Smokeless tobacco: Never Used  Substance and Sexual Activity  . Alcohol use: Not Currently    Alcohol/week: 0.0 standard drinks  . Drug use: No  . Sexual activity: Yes    Birth control/protection: Surgical  Lifestyle  . Physical activity    Days per week: Not on file    Minutes per session: Not on file  . Stress: Not on file  Relationships  . Social Herbalist on phone: Not on file    Gets together: Not on file    Attends religious service: Not on file    Active member of club or organization: Not on file    Attends meetings of clubs or organizations: Not on file    Relationship status: Not on file  . Intimate partner violence    Fear of current or ex partner: Not on file    Emotionally abused: Not on file    Physically abused: Not on file    Forced sexual activity: Not on file  Other Topics Concern  . Not on file  Social History Narrative  . Not on file    Past Medical History, Surgical history, Social history, and Family history were reviewed and updated as appropriate.   Please see review of systems for further details on the patient's review from today.   Objective:   Physical Exam:   There were no vitals taken for this visit.  Physical Exam Constitutional:      General: She is not in acute distress.    Appearance: She is well-developed.  Musculoskeletal:        General: No deformity.  Skin:    Findings: Rash present.     Comments: Pustular rash.   Neurological:     Mental Status: She is alert and oriented to person, place, and time.  Coordination: Coordination normal.  Psychiatric:        Attention and Perception: Attention and perception normal. She does not perceive auditory or visual hallucinations.        Mood and Affect: Mood is anxious and depressed. Affect is not labile, blunt, angry or inappropriate.        Speech: Speech normal.        Behavior: Behavior normal.        Thought Content: Thought content normal. Thought content is not paranoid or delusional. Thought content does not include homicidal or suicidal ideation. Thought content does not include homicidal or suicidal plan.        Cognition and Memory: Cognition and memory normal.        Judgment: Judgment normal.     Comments: Insight intact. No delusions.      Lab Review:     Component Value Date/Time   NA 138 11/24/2015 2200   K 3.8 11/24/2015 2200   CL 106 11/24/2015 2200   CO2 21 (L) 11/24/2015 2200   GLUCOSE 90 11/24/2015 2200   BUN 14 11/24/2015 2200   CREATININE 0.71 11/24/2015 2200   CALCIUM 9.3 11/24/2015 2200   PROT 7.5 11/24/2015 2200   ALBUMIN 4.0 11/24/2015 2200   AST 91 (H) 11/24/2015 2200   ALT 61 (H) 11/24/2015 2200   ALKPHOS 93 11/24/2015 2200   BILITOT 0.9 11/24/2015 2200   GFRNONAA >60 11/24/2015 2200   GFRAA >60 11/24/2015 2200       Component Value Date/Time   WBC 9.8 11/24/2015 2200   RBC 3.78 (L) 11/24/2015 2200   HGB 12.0 11/24/2015 2200   HCT 35.7 (L) 11/24/2015 2200   PLT 282 11/24/2015 2200   MCV 94.4 11/24/2015 2200   MCH 31.7 11/24/2015 2200   MCHC 33.6 11/24/2015 2200   RDW 12.8 11/24/2015 2200    No results found for: POCLITH, LITHIUM    No results found for: PHENYTOIN, PHENOBARB, VALPROATE, CBMZ   .res Assessment: Plan:   Will increase sertraline to 150 mg daily to improve mood and anxiety signs and symptoms. Will restart clonidine 0.1 mg p.o. twice daily as needed for anxiety. Will restart doxepin 10 mg p.o. nightly for insomnia. Will refill all other medications as prescribed upon discharge from rehab. Patient reports that she would like to see therapist to address unresolved grief related to the loss of her husband.  Patient assisted with scheduling appointment to see Rockne Menghini, LCSW. Patient to follow-up with this provider in 4 weeks or sooner if clinically indicated. Patient advised to contact office with any questions, adverse effects, or acute worsening in signs and symptoms.  Zarai was seen today for anxiety, alcohol problem, depression and insomnia.  Diagnoses and all orders for this visit:  Alcohol dependence in remission (HCC) -     gabapentin (NEURONTIN) 600 MG tablet; Take 1 tablet (600 mg total) by mouth at bedtime. -     topiramate (TOPAMAX) 100 MG tablet; Take 1.5 tablets (150 mg total) by mouth at bedtime. -     cloNIDine (CATAPRES) 0.1 MG tablet; Take 1 tablet (0.1 mg total) by mouth 2 (two) times daily as needed.  Anxiety -     gabapentin (NEURONTIN) 600 MG tablet; Take 1 tablet (600 mg total) by mouth at bedtime. -     OLANZapine (ZYPREXA) 5 MG tablet; Take 1 tablet (5 mg total) by mouth at bedtime. -     topiramate (TOPAMAX) 100 MG tablet; Take 1.5 tablets (150  mg total) by mouth at bedtime. -     sertraline (ZOLOFT) 100 MG tablet; Take 1.5 tablets (150 mg total) by mouth daily. -     cloNIDine (CATAPRES) 0.1 MG tablet; Take 1 tablet (0.1 mg total) by mouth 2 (two) times daily as needed.  Primary insomnia -     gabapentin (NEURONTIN) 600 MG tablet; Take 1 tablet (600 mg total) by mouth at bedtime. -     OLANZapine (ZYPREXA) 5 MG tablet; Take 1 tablet (5 mg total) by mouth at bedtime. -      topiramate (TOPAMAX) 100 MG tablet; Take 1.5 tablets (150 mg total) by mouth at bedtime. -     doxepin (SINEQUAN) 10 MG capsule; Take 1 capsule (10 mg total) by mouth at bedtime.  Other depression -     OLANZapine (ZYPREXA) 5 MG tablet; Take 1 tablet (5 mg total) by mouth at bedtime. -     sertraline (ZOLOFT) 100 MG tablet; Take 1.5 tablets (150 mg total) by mouth daily.     Please see After Visit Summary for patient specific instructions.  Future Appointments  Date Time Provider Department Center  03/04/2019  3:00 PM Mathis FareDowd, Deborah, LCSW [facility] None  04/02/2019  1:30 PM Corie Chiquitoarter, Razia Screws, PMHNP [facility] None    No orders of the defined types were placed in this encounter.   -------------------------------

## 2019-03-04 ENCOUNTER — Other Ambulatory Visit: Payer: Self-pay

## 2019-03-04 ENCOUNTER — Ambulatory Visit (INDEPENDENT_AMBULATORY_CARE_PROVIDER_SITE_OTHER): Payer: BC Managed Care – PPO | Admitting: Psychiatry

## 2019-03-04 DIAGNOSIS — F411 Generalized anxiety disorder: Secondary | ICD-10-CM | POA: Diagnosis not present

## 2019-03-04 NOTE — Progress Notes (Signed)
[facility] Counsel[facility] Initial Adult Exam  Name: [name] Date: 03/04/2019 MRN: [id number] DOB: January 03, 1966 PCP: Berkley [place], NP  Time spent: 60 minutes  3:00pm to 4:00pm  Guardian/Payee:  patient  Paperwork requested:  No   Reason for Visit /Presenting Problem:  Grief, depression, anxiety, loneliness, sadness, frustration, not currently drinking (after relapsing in July, been sober since Sept. 4th)  Mental Status Exam:   Appearance:   Neat     Behavior:  Appropriate and Sharing  Motor:  Normal  Speech/Language:   Clear and Coherent  Affect:  Depressed and anxious  Mood:  anxious and depressed  Thought process:  goal directed  Thought content:    WNL  Sensory/Perceptual disturbances:    WNL  Orientation:  oriented to person, place, time/date, situation, day of week, month of year and year  Attention:  Good  Concentration:  Good  Memory:  Pt reports some short term memory issues  Fund of knowledge:   Good  Insight:    Good  Judgment:   Good  Impulse Control:  Fair   Reported Symptoms:  See symptoms above  Risk Assessment: Danger to Self:  No Self-injurious Behavior: No Danger to Others: No Duty to Warn:no Physical Aggression / Violence:No  Access to Firearms a concern: No  Gang Involvement:No  Patient / guardian was educated about steps to take if suicide or homicide risk level increases between visits:  Patient denies any SI or HI. While future psychiatric events cannot be accurately predicted, the patient does not currently require acute inpatient psychiatric care and does not currently meet [place] Day involuntary commitment criteria.  Substance Abuse History: Current substance abuse: No     Past Psychiatric History:   Previous psychological history is significant for anxiety, depression and med management Outpatient Providers:[facility] History of Psych Hospitalization: 1 time after she'd been drinking overnight Psychological Testing: n/a   Abuse  History: Victim of No., n/a   Report needed: No. Victim of Neglect:No. Perpetrator of n/a  Witness / Exposure to Domestic Violence: No   Protective Services Involvement: No  Witness to [employer] Violence:  No   Family History:  Patient confirms info below. Family History  Problem Relation Age of Onset  . Cancer Mother 70       breast  . Depression Mother   . Heart disease Mother   . Macular degeneration Mother   . Diabetes Maternal Grandmother   . Heart disease Maternal Grandmother   . Stroke Maternal Grandmother   . Alcohol abuse Sister   . Depression Sister   . Alcohol abuse Paternal Grandmother   . Cancer Father   . COPD Father   . Depression Brother   . Early death Neg Hx     Living situation: the patient lives with  son  Sexual Orientation:  Straight  Relationship Status: widowed, husband died in Aug 13, 2017 of lung CA, been married 57 yrs Name of spouse / other:  n/a             If a parent, number of children / ages:2 sons, ages 2, 23;  Eduard Clos goes to [employer] and on the golf team; Campbell Lerner is senior at [employer]; friends, twin sister is "sober and supportive", Parents, other family, AA friends  Museum/gallery curator Stress:  Yes   Income/Employment/Disability: had job Copywriter, advertising: No   Educational History: Education: Scientist, product/process development:   [employer]  Any cultural differences that may affect /  interfere with treatment:  not applicable   Recreation/Hobbies: spend times with friends, reading, cooking  Stressors:Loss of loss of husband to CA last year  Strengths:  Supportive Relationships, Family, Friends, Church, Hopefulness, Journalist, newspaperelf Advocate and Able to W. [employer]. [employer]icate Effectively, optimistic  Barriers:  Laziness, self-doubt, motivation, isolation  Legal History: Pending legal issue / charges: The patient has no significant history of legal issues. History of legal issue / charges: none  reported  Medical History/Surgical History:reviewed and patient confirms all medical info below.   Past Medical History:  Diagnosis Date  . Alcohol dependence (HCC)   . Allergy   . Depression   . Headache   . HTN (hypertension)     Past Surgical History:  Procedure Laterality Date  . CESAREAN SECTION      Medications: Current Outpatient Medications  Medication Sig Dispense Refill  . cloNIDine (CATAPRES) 0.1 MG tablet Take 1 tablet (0.1 mg total) by mouth 2 (two) times daily as needed. 60 tablet 1  . doxepin (SINEQUAN) 10 MG capsule Take 1 capsule (10 mg total) by mouth at bedtime. 30 capsule 1  . gabapentin (NEURONTIN) 600 MG tablet Take 1 tablet (600 mg total) by mouth at bedtime. 30 tablet 1  . Melatonin 10 MG TABS Take by mouth.    . Multiple Vitamins-Minerals (MULTIVITAMIN WITH MINERALS) tablet Take 1 tablet by mouth daily.    . naproxen sodium (ALEVE) 220 MG tablet Take 220 mg by mouth daily as needed.    Marland Kitchen. OLANZapine (ZYPREXA) 5 MG tablet Take 1 tablet (5 mg total) by mouth at bedtime. 30 tablet 1  . sertraline (ZOLOFT) 100 MG tablet Take 1.5 tablets (150 mg total) by mouth daily. 45 tablet 1  . topiramate (TOPAMAX) 100 MG tablet Take 1.5 tablets (150 mg total) by mouth at bedtime. 45 tablet 1   No current facility-administered medications for this visit.     Allergies  Allergen Reactions  . Erythromycin     Stomach  cramps    Diagnoses:    ICD-10-CM   1. Generalized anxiety disorder  F41.1     Subjective: Patient is widowed 53 yr old mother of 2 sons, ages 6017 and 5520.  Husband died of lung CA in 2019.   She is looking for a job and had interview earlier today.  Graduated from [employer] in [employer]. Is recovering alcoholic, relapsed last but has been sober since Sept. 4, 2020.  Very verbal.  Informative. Lots of good support from family and friends.  Reports her symptoms as grief, depression, anxiety.   Plan of Care:  Patient not signing tx plan on computer  screen due to COVID-19.  Treatment Goals: Goals remain on tx plan as patient works on strategies to meet her goals.  Progress will be noted each session in "Progress" section of Plan.  Long term goals: Reduce overall level, frequency, and intensity of anxiety so that daily functioning is not impaired.  Short term goals: Increase understanding of beliefs and messages that lead to worry and anxiety.  Strategy: Explore cognitive messages that lead to anxiety responses and retrain in adaptive cognitions.  Progress: This is patient's initial session so we together worked on formulating her tx plan.  She was very verbal and actively involved with this, seems motivated but also admits to distracting easily as change can be difficult. I asked her to go ahead and begin self-monitoring her thoughts, especially when she is feeling more anxious, and to keep a list of those thoughts or  document them on her phone and have with her next session.     Next session in 2-3 weeks.   Mathis Fare, LCSW

## 2019-03-17 ENCOUNTER — Other Ambulatory Visit: Payer: Self-pay | Admitting: Psychiatry

## 2019-03-17 DIAGNOSIS — F5101 Primary insomnia: Secondary | ICD-10-CM

## 2019-03-30 DIAGNOSIS — F1020 Alcohol dependence, uncomplicated: Secondary | ICD-10-CM | POA: Insufficient documentation

## 2019-04-02 ENCOUNTER — Ambulatory Visit: Payer: BC Managed Care – PPO | Admitting: Psychiatry

## 2019-04-02 ENCOUNTER — Encounter

## 2019-04-03 ENCOUNTER — Ambulatory Visit (INDEPENDENT_AMBULATORY_CARE_PROVIDER_SITE_OTHER): Payer: BC Managed Care – PPO | Admitting: Psychology

## 2019-04-03 ENCOUNTER — Other Ambulatory Visit: Payer: Self-pay

## 2019-04-03 ENCOUNTER — Encounter (HOSPITAL_COMMUNITY): Payer: Self-pay | Admitting: Psychology

## 2019-04-03 DIAGNOSIS — F1020 Alcohol dependence, uncomplicated: Secondary | ICD-10-CM | POA: Diagnosis not present

## 2019-04-03 MED ORDER — PROMETHAZINE HCL (BULK CHEMICALS - P'S)
50.00 | Status: DC
Start: ? — End: 2019-04-03

## 2019-04-03 MED ORDER — BL TUSSIN CF 30-10-100 MG/5ML PO SYRP
2.00 | ORAL_SOLUTION | ORAL | Status: DC
Start: ? — End: 2019-04-03

## 2019-04-03 MED ORDER — SB BRONCHIAL 12.5-200 MG PO TABS
50.00 | ORAL_TABLET | ORAL | Status: DC
Start: ? — End: 2019-04-03

## 2019-04-03 MED ORDER — DIOVAN HCT 160-25 MG PO TABS
5.00 | ORAL_TABLET | ORAL | Status: DC
Start: ? — End: 2019-04-03

## 2019-04-03 MED ORDER — PHARMALOX 225-200 MG/5ML PO SUSP
10.00 | ORAL | Status: DC
Start: ? — End: 2019-04-03

## 2019-04-03 MED ORDER — IPOL IJ INJ
25.00 | INJECTION | INTRAMUSCULAR | Status: DC
Start: 2019-04-02 — End: 2019-04-03

## 2019-04-03 MED ORDER — IPOL IJ INJ
25.00 | INJECTION | INTRAMUSCULAR | Status: DC
Start: ? — End: 2019-04-03

## 2019-04-03 MED ORDER — R-TANNIC-S A/D 30-5 MG/5ML PO SUSP
200.00 | ORAL | Status: DC
Start: 2019-04-02 — End: 2019-04-03

## 2019-04-03 MED ORDER — DOLACET PO
0.10 | ORAL | Status: DC
Start: ? — End: 2019-04-03

## 2019-04-03 MED ORDER — INTRON A 6000000 UNIT/ML IJ SOLN
1.00 | INTRAMUSCULAR | Status: DC
Start: ? — End: 2019-04-03

## 2019-04-03 MED ORDER — PYRILAMINE TAN-PHENYLEPH TAN 30-5 MG/5ML PO SUSP
50.00 | ORAL | Status: DC
Start: ? — End: 2019-04-03

## 2019-04-03 MED ORDER — NUTRINATE PO CHEW
2.00 | CHEWABLE_TABLET | ORAL | Status: DC
Start: ? — End: 2019-04-03

## 2019-04-03 MED ORDER — NUTREN 1.0 PO
1.00 | ORAL | Status: DC
Start: ? — End: 2019-04-03

## 2019-04-03 MED ORDER — Medication
1.00 | Status: DC
Start: 2019-04-03 — End: 2019-04-03

## 2019-04-03 MED ORDER — RA GLUCOSAMINE-CHONDROITIN DS 500-400 MG PO TABS
100.00 | ORAL_TABLET | ORAL | Status: DC
Start: 2019-04-02 — End: 2019-04-03

## 2019-04-03 MED ORDER — NUTRINATE PO CHEW
1.00 | CHEWABLE_TABLET | ORAL | Status: DC
Start: ? — End: 2019-04-03

## 2019-04-03 MED ORDER — PIPERACILLIN SODIUM
10.00 | Status: DC
Start: ? — End: 2019-04-03

## 2019-04-03 MED ORDER — GENAPAP 325 MG PO TABS
2.00 | ORAL_TABLET | ORAL | Status: DC
Start: ? — End: 2019-04-03

## 2019-04-03 MED ORDER — NEOMYCIN-POLYMYXIN-GRAMICIDIN 1.75-10000-.025 OP SOLN
8.00 | OPHTHALMIC | Status: DC
Start: ? — End: 2019-04-03

## 2019-04-03 MED ORDER — QUINERVA 260 MG PO TABS
650.00 | ORAL_TABLET | ORAL | Status: DC
Start: ? — End: 2019-04-03

## 2019-04-03 MED ORDER — CETIRIZINE HCL 10 MG PO TABS
10.00 | ORAL_TABLET | ORAL | Status: DC
Start: ? — End: 2019-04-03

## 2019-04-03 MED ORDER — ANANA 50000 UNITS PO TABS
200.00 | ORAL_TABLET | ORAL | Status: DC
Start: 2019-04-03 — End: 2019-04-03

## 2019-04-03 MED ORDER — Medication
30.00 | Status: DC
Start: ? — End: 2019-04-03

## 2019-04-03 MED ORDER — MEIJER TRUERESULT GLUCOSE SYS W/DEVICE KIT
500.00 | PACK | Status: DC
Start: 2019-04-02 — End: 2019-04-03

## 2019-04-03 MED ORDER — Medication
5.00 | Status: DC
Start: 2019-04-03 — End: 2019-04-03

## 2019-04-03 MED ORDER — PEDIASURE 1.0 CAL/FIBER PO LIQD
2.00 | ORAL | Status: DC
Start: ? — End: 2019-04-03

## 2019-04-03 MED ORDER — Medication
5.00 | Status: DC
Start: ? — End: 2019-04-03

## 2019-04-03 MED ORDER — MP TRI-FED COLD 2.5-60 MG PO TABS
50.00 | ORAL_TABLET | ORAL | Status: DC
Start: ? — End: 2019-04-03
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
## 2019-04-06 ENCOUNTER — Encounter (HOSPITAL_COMMUNITY): Payer: Self-pay | Admitting: Medical

## 2019-04-06 ENCOUNTER — Other Ambulatory Visit (HOSPITAL_COMMUNITY): Payer: Self-pay | Admitting: Medical

## 2019-04-06 ENCOUNTER — Other Ambulatory Visit: Payer: Self-pay

## 2019-04-06 ENCOUNTER — Other Ambulatory Visit (HOSPITAL_COMMUNITY): Payer: BC Managed Care – PPO | Attending: Psychiatry | Admitting: Licensed Clinical Social Worker

## 2019-04-06 VITALS — BP 128/80 | HR 70 | Ht 62.0 in | Wt 195.0 lb

## 2019-04-06 DIAGNOSIS — Z811 Family history of alcohol abuse and dependence: Secondary | ICD-10-CM | POA: Insufficient documentation

## 2019-04-06 DIAGNOSIS — F341 Dysthymic disorder: Secondary | ICD-10-CM

## 2019-04-06 DIAGNOSIS — Z6372 Alcoholism and drug addiction in family: Secondary | ICD-10-CM | POA: Diagnosis not present

## 2019-04-06 DIAGNOSIS — F4321 Adjustment disorder with depressed mood: Secondary | ICD-10-CM | POA: Diagnosis not present

## 2019-04-06 DIAGNOSIS — Z560 Unemployment, unspecified: Secondary | ICD-10-CM | POA: Diagnosis not present

## 2019-04-06 DIAGNOSIS — Z634 Disappearance and death of family member: Secondary | ICD-10-CM | POA: Insufficient documentation

## 2019-04-06 DIAGNOSIS — F4310 Post-traumatic stress disorder, unspecified: Secondary | ICD-10-CM | POA: Diagnosis present

## 2019-04-06 DIAGNOSIS — R635 Abnormal weight gain: Secondary | ICD-10-CM | POA: Insufficient documentation

## 2019-04-06 DIAGNOSIS — Z79899 Other long term (current) drug therapy: Secondary | ICD-10-CM | POA: Insufficient documentation

## 2019-04-06 DIAGNOSIS — R413 Other amnesia: Secondary | ICD-10-CM | POA: Diagnosis not present

## 2019-04-06 DIAGNOSIS — R519 Headache, unspecified: Secondary | ICD-10-CM | POA: Diagnosis not present

## 2019-04-06 DIAGNOSIS — L239 Allergic contact dermatitis, unspecified cause: Secondary | ICD-10-CM | POA: Diagnosis not present

## 2019-04-06 DIAGNOSIS — F10239 Alcohol dependence with withdrawal, unspecified: Secondary | ICD-10-CM | POA: Diagnosis not present

## 2019-04-06 DIAGNOSIS — K7010 Alcoholic hepatitis without ascites: Secondary | ICD-10-CM | POA: Diagnosis not present

## 2019-04-06 DIAGNOSIS — F1020 Alcohol dependence, uncomplicated: Secondary | ICD-10-CM

## 2019-04-06 DIAGNOSIS — F1910 Other psychoactive substance abuse, uncomplicated: Secondary | ICD-10-CM | POA: Insufficient documentation

## 2019-04-06 DIAGNOSIS — R41843 Psychomotor deficit: Secondary | ICD-10-CM | POA: Insufficient documentation

## 2019-04-06 DIAGNOSIS — F4381 Prolonged grief disorder: Secondary | ICD-10-CM

## 2019-04-06 DIAGNOSIS — R251 Tremor, unspecified: Secondary | ICD-10-CM | POA: Insufficient documentation

## 2019-04-06 DIAGNOSIS — F410 Panic disorder [episodic paroxysmal anxiety] without agoraphobia: Secondary | ICD-10-CM | POA: Insufficient documentation

## 2019-04-06 DIAGNOSIS — I10 Essential (primary) hypertension: Secondary | ICD-10-CM | POA: Diagnosis not present

## 2019-04-06 DIAGNOSIS — R450 Nervousness: Secondary | ICD-10-CM | POA: Diagnosis not present

## 2019-04-06 DIAGNOSIS — R4587 Impulsiveness: Secondary | ICD-10-CM | POA: Diagnosis not present

## 2019-04-06 DIAGNOSIS — F5101 Primary insomnia: Secondary | ICD-10-CM

## 2019-04-06 DIAGNOSIS — R5383 Other fatigue: Secondary | ICD-10-CM | POA: Diagnosis not present

## 2019-04-06 MED ORDER — BACLOFEN 10 MG PO TABS: 10.0000 mg | ORAL_TABLET | Freq: Three times a day (TID) | ORAL | 1 refills | Status: DC
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
## 2019-04-07 NOTE — Progress Notes (Signed)
    Daily Group Progress Note  Program: CD-IOP   Group Time: 1pm-2:30pm Participation Level: Active Behavioral Response: Appropriate Type of Therapy: Process Group  Topic: Clinician checked in with group members, assessing for SI/HI/psychosis and overall level of functioning including relapse or barriers to recovery. Clinician and group members read and processed Daily reflection with a focus on 'surviving our emotions.' Clinician presented DBT Mindfulness skill of "Letting Go of Emotional Suffering." Clinician and group members processed addict vs clean vs clear mind in relation to distress tolerance.   Group Time: 2:30-4pm Participation Level: Active Behavioral Response: Appropriate Type of Therapy: Psycho-educational Group  Topic: Psycho-educational session co-facilitated by yoga instructor to focus on practicing mindfulness. Clinician provided clients with options for additional mindfulness activities both physically and mentally. Clinician inquired about self-care activity for the day. Clinician and group members welcomed new member and celebrated graduation of other members successfully.    Summary: Client presented for 1st group with a sobriety date of 03/30/2019. Client reported re-engaging in [place] following relapse, ongoing grief, and her children 'hating' her. Client shared positive experience with having a sponsor and encouraged younger members to reach out for a sponsor. Client met with PA-C; see note for additional details.   Family Program: Family present? No   Name of family member(s): NA  UDS collected: No   AA/NA attended?: Yes  Sponsor?: Yes   Olegario Messier, LCSW

## 2019-04-08 ENCOUNTER — Encounter: Payer: Self-pay | Admitting: Psychiatry

## 2019-04-08 ENCOUNTER — Ambulatory Visit (INDEPENDENT_AMBULATORY_CARE_PROVIDER_SITE_OTHER): Payer: BC Managed Care – PPO | Admitting: Psychiatry

## 2019-04-08 ENCOUNTER — Other Ambulatory Visit: Payer: Self-pay

## 2019-04-08 ENCOUNTER — Encounter (HOSPITAL_COMMUNITY): Payer: BC Managed Care – PPO

## 2019-04-08 DIAGNOSIS — F1021 Alcohol dependence, in remission: Secondary | ICD-10-CM | POA: Diagnosis not present

## 2019-04-08 DIAGNOSIS — F419 Anxiety disorder, unspecified: Secondary | ICD-10-CM | POA: Diagnosis not present

## 2019-04-08 DIAGNOSIS — F3289 Other specified depressive episodes: Secondary | ICD-10-CM | POA: Diagnosis not present

## 2019-04-08 DIAGNOSIS — F5101 Primary insomnia: Secondary | ICD-10-CM

## 2019-04-08 MED ORDER — TRAZODONE HCL 100 MG PO TABS: 200.0000 mg | ORAL_TABLET | Freq: Every day | ORAL | 1 refills | Status: DC

## 2019-04-08 MED ORDER — SERTRALINE HCL 100 MG PO TABS: 200.0000 mg | ORAL_TABLET | Freq: Every day | ORAL | 1 refills | Status: DC

## 2019-04-08 MED ORDER — ARIPIPRAZOLE 5 MG PO TABS: 5.0000 mg | ORAL_TABLET | Freq: Every day | ORAL | 1 refills | Status: DC

## 2019-04-08 MED ORDER — DOXEPIN HCL 10 MG PO CAPS: ORAL_CAPSULE | ORAL | 1 refills | Status: DC

## 2019-04-08 MED ORDER — CLONIDINE HCL 0.1 MG PO TABS: 0.1000 mg | ORAL_TABLET | Freq: Two times a day (BID) | ORAL | 1 refills | Status: DC | PRN
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
## 2019-04-09 ENCOUNTER — Other Ambulatory Visit (HOSPITAL_COMMUNITY): Payer: BC Managed Care – PPO | Admitting: Licensed Clinical Social Worker

## 2019-04-09 ENCOUNTER — Other Ambulatory Visit: Payer: Self-pay

## 2019-04-09 DIAGNOSIS — F331 Major depressive disorder, recurrent, moderate: Secondary | ICD-10-CM

## 2019-04-09 DIAGNOSIS — F4310 Post-traumatic stress disorder, unspecified: Secondary | ICD-10-CM | POA: Diagnosis not present

## 2019-04-09 DIAGNOSIS — F1020 Alcohol dependence, uncomplicated: Secondary | ICD-10-CM

## 2019-04-10 ENCOUNTER — Ambulatory Visit (HOSPITAL_COMMUNITY): Payer: BC Managed Care – PPO | Admitting: Licensed Clinical Social Worker

## 2019-04-10 ENCOUNTER — Other Ambulatory Visit: Payer: Self-pay

## 2019-04-13 ENCOUNTER — Other Ambulatory Visit (HOSPITAL_COMMUNITY): Payer: BC Managed Care – PPO

## 2019-04-13 ENCOUNTER — Other Ambulatory Visit: Payer: Self-pay

## 2019-04-15 ENCOUNTER — Other Ambulatory Visit: Payer: Self-pay

## 2019-04-15 ENCOUNTER — Other Ambulatory Visit (INDEPENDENT_AMBULATORY_CARE_PROVIDER_SITE_OTHER): Payer: BC Managed Care – PPO | Admitting: Licensed Clinical Social Worker

## 2019-04-15 ENCOUNTER — Encounter (HOSPITAL_COMMUNITY): Payer: Self-pay | Admitting: Medical

## 2019-04-15 DIAGNOSIS — F341 Dysthymic disorder: Secondary | ICD-10-CM

## 2019-04-15 DIAGNOSIS — F1020 Alcohol dependence, uncomplicated: Secondary | ICD-10-CM

## 2019-04-15 DIAGNOSIS — Z811 Family history of alcohol abuse and dependence: Secondary | ICD-10-CM

## 2019-04-15 DIAGNOSIS — K7010 Alcoholic hepatitis without ascites: Secondary | ICD-10-CM

## 2019-04-15 DIAGNOSIS — F410 Panic disorder [episodic paroxysmal anxiety] without agoraphobia: Secondary | ICD-10-CM

## 2019-04-15 DIAGNOSIS — I10 Essential (primary) hypertension: Secondary | ICD-10-CM

## 2019-04-15 DIAGNOSIS — F4381 Prolonged grief disorder: Secondary | ICD-10-CM

## 2019-04-15 DIAGNOSIS — F5101 Primary insomnia: Secondary | ICD-10-CM

## 2019-04-15 DIAGNOSIS — Z6372 Alcoholism and drug addiction in family: Secondary | ICD-10-CM

## 2019-04-15 DIAGNOSIS — F4329 Adjustment disorder with other symptoms: Secondary | ICD-10-CM

## 2019-04-15 DIAGNOSIS — F4321 Adjustment disorder with depressed mood: Secondary | ICD-10-CM

## 2019-04-15 DIAGNOSIS — F4310 Post-traumatic stress disorder, unspecified: Secondary | ICD-10-CM | POA: Diagnosis not present

## 2019-04-15 NOTE — Progress Notes (Signed)
   [facility] Follow-up Outpatient CDIOP Date: 04/15/19  Admission Date:04/06/19  Sobriety date: Nov 29,2020  Subjective: "I'm doing good"  HPI : Initial CD IOP Provider FU  Pt reports continued abstinence . She was wanting sons to come home for Christmas and received word that both have decided to. She is active again  In [place] . Her mood is once again euthymic with Ambien addition. She continues to work on her thinking around the time of her relapse when she stopped taking medication and took that 1st drink. She has established she had feelings of loneliness and boredom at the time. She is having no difficulty with Baclofen MAT.  Review of Systems: Psychiatric: Agitation: No Hallucination: No Depressed Mood: Euthymic on medication Insomnia: rX TRAZODONE Hypersomnia: No Altered Concentration: No Feels Worthless: No Grandiose Ideas: No Belief In Special Powers: No New/Increased Substance Abuse: No Compulsions: No  Neurologic: Headache: No Seizure: No Paresthesias: No  Current Medications: albuterol 108 (90 Base) MCG/ACT inhaler Commonly known as: VENTOLIN HFA Inhale into the lungs.  ARIPiprazole 5 MG tablet Commonly known as: ABILIFY Take 1 tablet (5 mg total) by mouth daily.  baclofen 10 MG tablet Commonly known as: LIORESAL Take 1 tablet (10 mg total) by mouth 3 (three) times daily.  cloNIDine 0.1 MG tablet Commonly known as: CATAPRES Take 1 tablet (0.1 mg total) by mouth 2 (two) times daily as needed.  doxepin 10 MG capsule Commonly known as: SINEQUAN Take 1-2 po QHS prn  Melatonin 10 MG Tabs Take by mouth.  MULTIPLE VITAMIN PO Take by mouth.  multivitamin with minerals tablet Take 1 tablet by mouth daily.  naproxen sodium 220 MG tablet Commonly known as: ALEVE Take 220 mg by mouth daily as needed.  sertraline 100 MG tablet Commonly known as: ZOLOFT Take 2 tablets (200 mg total) by mouth daily.  traZODone 100 MG tablet Commonly known        Mental  Status Examination  Appearance: Alert: Yes Attention: good  Cooperative: Yes Eye Contact: Good Speech: Clear and coherent Psychomotor Activity: Normal Memory/Concentration: Normal/intact Oriented: person, place, time/date and situation Mood: Euthymic Affect: Appropriate and Congruent Thought Processes and Associations: Coherent and Intact Fund of Knowledge: Good Thought Content: WDL Insight: Good Judgement: Good  ERD:EYCXKGY PDMP -negative  Diagnosis:  0 Alcohol use disorder, severe, dependence (HCC) 0 Family hx of alcoholism 0 Primary dysthymia early onset 0 Severe anxiety with panic 0 Dysfunctional family due to alcoholism 0 Grief reaction with prolonged bereavement 0 Alcoholic hepatitis without ascites 0 Primary insomnia 0 Hypertension, unspecified type  Assessment:  Treatment Plan: Melinda Russian, PA-CPatient ID: [name], female   DOB: [birth year], 53 y.o.   MRN: [id number]

## 2019-04-16 ENCOUNTER — Other Ambulatory Visit: Payer: Self-pay | Admitting: Psychiatry

## 2019-04-16 ENCOUNTER — Other Ambulatory Visit (HOSPITAL_COMMUNITY): Payer: BC Managed Care – PPO | Admitting: Licensed Clinical Social Worker

## 2019-04-16 ENCOUNTER — Other Ambulatory Visit: Payer: Self-pay

## 2019-04-16 DIAGNOSIS — F410 Panic disorder [episodic paroxysmal anxiety] without agoraphobia: Secondary | ICD-10-CM

## 2019-04-16 DIAGNOSIS — F5101 Primary insomnia: Secondary | ICD-10-CM

## 2019-04-16 DIAGNOSIS — F4310 Post-traumatic stress disorder, unspecified: Secondary | ICD-10-CM | POA: Diagnosis not present

## 2019-04-16 DIAGNOSIS — F1020 Alcohol dependence, uncomplicated: Secondary | ICD-10-CM

## 2019-04-17 ENCOUNTER — Other Ambulatory Visit: Payer: Self-pay

## 2019-04-17 ENCOUNTER — Ambulatory Visit (HOSPITAL_COMMUNITY): Payer: BC Managed Care – PPO | Admitting: Licensed Clinical Social Worker

## 2019-04-17 DIAGNOSIS — F1020 Alcohol dependence, uncomplicated: Secondary | ICD-10-CM

## 2019-04-20 ENCOUNTER — Other Ambulatory Visit: Payer: Self-pay

## 2019-04-20 ENCOUNTER — Other Ambulatory Visit (HOSPITAL_COMMUNITY): Payer: BC Managed Care – PPO | Admitting: Licensed Clinical Social Worker

## 2019-04-20 DIAGNOSIS — F331 Major depressive disorder, recurrent, moderate: Secondary | ICD-10-CM

## 2019-04-20 DIAGNOSIS — F1020 Alcohol dependence, uncomplicated: Secondary | ICD-10-CM

## 2019-04-20 DIAGNOSIS — F4310 Post-traumatic stress disorder, unspecified: Secondary | ICD-10-CM | POA: Diagnosis not present

## 2019-04-20 NOTE — Progress Notes (Signed)
    Daily Group Progress Note  Program: CD-IOP   Group Time: 1pm-2:30pm  Participation Level: Active  Behavioral Response: Appropriate  Type of Therapy: Process Group  Topic:  Clinician checked in with clients, assessing for SI/HI/psychosis and overall level of functioning including cravings and relapse. Clinician and group members processed triggers in stressful relationships and having to make inconvenient changes, such as changing phone numbers to become less accessible to toxic relationships. Clinician and group members processed the importance of seeing support in early recovery, especially when clients have identified putting selves in high risk situations. Clinician and group members discussed the importance of being honest with both self and others of expectations in relationships, including honesty and what respect looks like to the client. Group members provided each other with additional resources for support and their experiences with reaching out for help.   Group Time: 2:30pm-4pm  Participation Level: Active  Behavioral Response: Appropriate  Type of Therapy: Psycho-education Group  Topic: Clinician provided psycho-education on the role of dopamine in motivation and how substance use effects dopamine levels in the brain. Clients and group members brain stormed ways to increase level of dopamine by completing small tasks daily and normalizing differences in feelings intoxicated vs sober.   Summary: Client provided support for group members struggling with relationship dynamics and provided hope for future healthy relationships. Client shared about progress with job interviews and being proud of her follow through and success during interviews. Client shares increasing reaching out with friends and remaining busy with church activities. Client shared some disappointment in lack of response to her gathering but also identified it was short notice and there is another event 2  days earlier with the same set of people. Client report minimal concerns with cravings and finds staying busy extremely helpful.   Family Program: Family present? No   Name of family member(s): NA  UDS collected: No; client denies substance use since release from detox  AA/NA attended?: Yes  Sponsor?: Yes   Olegario Messier, LCSW

## 2019-04-20 NOTE — Progress Notes (Signed)
    Daily Group Progress Note  Program: CD-IOP   Group Time: 1pm-2:30pm Participation Level: Active Behavioral Response: Appropriate Type of Therapy: Process Group Topic: Clinician met with clients, assessing for SI/HI/psychosis and overall level of functioning including attendance of recovery meetings and relapse or challenges to sobriety. Clinician and group members discussed highs and lows from previous days and reflection on topic from previous day. Clinician and group members read Daily Reflection and clinician facilitated discussion on fear and denial in recovery vs acceptance and willingness for change.   Group Time: 2:30pm-4pm Participation Level: Active Behavioral Response: Appropriate Type of Therapy: Psycho-education Group  Topic: Clinician provided psycho-educational group on PAWS. Clinician provided supplemental video with discussion on common PAWS symptoms and skills to help with management. Clinician utilized Post Acute Withdrawal (PAW) Self-Evaluation developed by Patrina Levering. Clinician and group members discussed the importance of being aware and tracking symptoms during recovery. Clinician provided supplemental video of information related to PAWS, common triggers, and relaxation techniques for management of symptoms. Clinician provided exercises for mindfulness as an alternative to meditations.   Summary: Client sobriety date reported as 03/30/19 from alcohol. Client shared her work on acceptance and willingness to seek help. Client shared she continues to work on her progress spiritually which helps address some of the fear. Client is receptive to information related to [place]. Client shared previously noticing symptoms following relapse but did not have concerns this time with mood swings or extreme cravings.   Family Program: Family present? No   Name of family member(s): NA  UDS collected: Yes Results: oxazepam  AA/NA attended?: Yes; 4 mtgs since group on  Thursday  Sponsor?: Yes   Olegario Messier, LCSW

## 2019-04-20 NOTE — Progress Notes (Addendum)
   THERAPIST PROGRESS NOTE  Session Time: 12pm-12:35pm  Participation Level: Active  Behavioral Response: CasualAlertEuthymic  Type of Therapy: Individual Therapy  Treatment Goals addressed: Coping and Diagnosis: Alcohol Use Disorder  Interventions: CBT, Motivational Interviewing and Supportive  Summary: [name] is a 53 y.o. female who presents with alcohol use disorder. Client reports no concerns with cravings and continued involvement with AA and church community. Client is happy children will be home for Christmas and is being mindful of expectations of how long they will stay. Client continues to apply to jobs and attend interviews and believes this has improved relationship with her sons. Client states she will begin working on step 4 with sponsor and is interested to see the differences this time as she feels she will be more engage in the process.  Suicidal/Homicidal: Nowithout intent/plan  Therapist Response: Clinician met with client for individual therapy session in addition to [place] group therapy. Clinician asked open ended questions and utilized reflective statements to review client's progress since engaging in program. Clinician processed with client the balance between staying busy and overbooking self which causes stress.  Clinician and client discussed using chain analysis wksht to identified thoughts or behaviors leading to relapse  Plan: Return again in 2 weeks.  Diagnosis: Axis I: Alcohol Abuse     Olegario Messier, LCSW 04/17/2019

## 2019-04-22 ENCOUNTER — Other Ambulatory Visit: Payer: Self-pay

## 2019-04-22 ENCOUNTER — Other Ambulatory Visit (HOSPITAL_COMMUNITY): Payer: BC Managed Care – PPO | Admitting: Licensed Clinical Social Worker

## 2019-04-22 DIAGNOSIS — F1020 Alcohol dependence, uncomplicated: Secondary | ICD-10-CM

## 2019-04-22 DIAGNOSIS — F4310 Post-traumatic stress disorder, unspecified: Secondary | ICD-10-CM | POA: Diagnosis not present

## 2019-04-22 DIAGNOSIS — F331 Major depressive disorder, recurrent, moderate: Secondary | ICD-10-CM

## 2019-04-22 DIAGNOSIS — F1994 Other psychoactive substance use, unspecified with psychoactive substance-induced mood disorder: Secondary | ICD-10-CM

## 2019-04-23 NOTE — Progress Notes (Signed)
    Daily Group Progress Note  Program: CD-IOP   Group Time: 1pm-2pm participation Level: Active Behavioral Response: Appropriate Type of Therapy: Process Group Topic: Clinician checked in with group members, assessing for SI/HI/psychosis and overall level of functioning including relapse and barriers to recovery. Clinician and group members discussed highlights and struggles since last group related to maintaining sobriety including identified triggers and responses. Clinician and group members processed Daily Reflection and  personal meeting to current work in recovery. Clinician provided mindfulness activity.   Group Time: 2pm-4pm Participation Level: Active Behavioral Response: Appropriate Type of Therapy: Psycho-education Group  Topic: Clinician and group members reviewed accomplishments and needs assessment, including physical, emotional, cognitive, and social needs. Clinician and group members discussed what it looks like to have needs met, and what it looks like when their personal needs are not met. Clinician facilitated discussion on self-awareness of feelings, unmet needs, and fears related to asking for help. Clinician facilitated discussion on meeting needs in healthy vs unhealthy way. Clinician and group members discussed breaking unhealthy patterns of getting needs met. Clinician and group members reviewed Personal Bill of Rights as it corresponds to relationship boundaries and expectations. Clients shared self-care plan to complete before the following group.   Summary: Client checked in sharing ongoing daily meetings and multiple interviews. Client provides support for group members, including providing options for available in person meetings. Client processes emotional needs and fears related to abandonment since her husband passed. Client reports continuing to see support through [place] for spiritual and social support as well as accountability.   Family Program: Family  present? No   Name of family member(s): NA  UDS collected: No Results: no substances found from previous UDS  AA/NA attended?: Yes daily  Sponsor?: Yes   Olegario Messier, LCSW

## 2019-04-23 NOTE — Progress Notes (Signed)
    Daily Group Progress Note  Program: CD-IOP  Group Time: 1pm-2pm Participation Level: Active Behavioral Response: Appropriate Type of Therapy: Process Group   Topic: Clinician checked in with group members, assessing for SI/HI/psychosis and overall level of functioning. Clinician and group members discussed any trouble with cravings or barriers to successful recovery. Clinician and group members reviewed and discussed AA Daily Reflection and NA Just for today and processed comments in relation to current stage in recovery.      Group Time: 2pm-4pm Participation Level: Active Behavioral Response: Appropriate Type of Therapy: Psycho-education Group   Topic: Clinician utilized psycho-educational session to focus on re-wiring the brain. Clinician utilized supportive material from Dr Halford Chessman 'Happy Brain: How to Overcome Our Neural Predisposition to Suffering.' Clinician and group members discussed natural bias and ways to support healthy thinking.  Clinician and group members practiced adding moments of mindfulness, gratitude, and self-compassion into daily life to support improved mood and long term positive interactions with the world. Clinician and group members reviewed HALT the BS and making decisions based on uncomfortable emotions, including avoiding feelings of abandonment.    Summary: Client reports sobriety date remains 03/30/19 from alcohol. Client continues to attend daily meetings in person or virtually. Client engaged in discussion on making decisions based on fear of abandonment and how this has effect her interaction with her children since her husband passed away. Client reports lonely is a difficult emotion as well as sad or stressed. Client is avoiding feeling bored by engaging in multiple activities to keep herself busy throughout the day, this also helps with avoiding feeling tired and she reports improved sleep at night since stopping drinking and getting on a regular  routine.   Family Program: Family present? No   Name of family member(s): NA  UDS collected: Yes Results: oxazepam   AA/NA attended?: Yes 1-2 mtgs per day  Sponsor?: Yes   Olegario Messier, LCSW

## 2019-04-27 ENCOUNTER — Encounter (HOSPITAL_COMMUNITY): Payer: BC Managed Care – PPO | Admitting: Medical

## 2019-04-27 ENCOUNTER — Other Ambulatory Visit (HOSPITAL_COMMUNITY): Payer: Self-pay | Admitting: Medical

## 2019-04-27 NOTE — Progress Notes (Signed)
    Daily Group Progress Note  Program: CD-IOP   Group Time: 1pm-2pm Participation Level: Active Behavioral Response: Appropriate Type of Therapy: Process Group  Topic: Clinician checked in with group members, assessing for SI/HI/psychosis and overall level of functioning including relapse and barriers to recovery. Clinician and group members discussed highlights and struggles since last group related to maintaining sobriety including identified triggers and responses. Clinician and group members processed Daily Reflection and  personal meeting to current work in recovery.    Group Time: 2pm-4pm Participation Level: Active Behavioral Response: Appropriate Type of Therapy: Psycho-education Group  Topic: Clinician presented psychoeducational material on ' Feelings, Thoughts, and Mind Traps: A Guide to [employer]' from [employer] Clinician facilitated discussion with clients on types of distorted thinking styles and how to challenge thoughts. Clinician allowed time for clients to discuss examples of distorted thoughts as well as help other group members create more helpful thoughts and phrases. Clinician wrapped up with Steps for challenging mind traps. Clinician continued with Roadblocks to Healthy Thinking 'Ways of Thinking That Keep You Stuck.' Clinician facilitated conversation related to how distorted thinking effects behaviors. Clinician reminded group members of Cognitive Triangle and the connection between thoughts, feelings, and behaviors. Clinician and group members discussed when examples are genuine (ex: confusion) vs when they are unhelpful and help avoid taking responsibility for own behaviors.  Clinician closed requested a self-care activity to support recovery planned between now and next group.   Summary: Sobriety date remains 03/30/19. Client reports remaining hopeful for holidays and family time. Client reports planning meeting attendance around time with her children.  Client helps discuss with group possible triggers to be avoided around the holiday. Client is able to identify previously used distorted thinking styles prior to relapse and phrases she will be mindful of in the future.   Family Program: Family present? No   Name of family member(s): NA  UDS collected: No Results: trazadone, sertraline (prescribed) Oxazepam, Doxepin(Rx)  AA/NA attended?: Yes  Sponsor?: Yes   Olegario Messier, LCSW

## 2019-04-29 ENCOUNTER — Other Ambulatory Visit: Payer: Self-pay

## 2019-04-29 ENCOUNTER — Other Ambulatory Visit (HOSPITAL_COMMUNITY): Payer: BC Managed Care – PPO | Admitting: Licensed Clinical Social Worker

## 2019-04-29 ENCOUNTER — Other Ambulatory Visit (HOSPITAL_COMMUNITY): Payer: Self-pay | Admitting: Medical

## 2019-04-29 DIAGNOSIS — F331 Major depressive disorder, recurrent, moderate: Secondary | ICD-10-CM

## 2019-04-29 DIAGNOSIS — F1020 Alcohol dependence, uncomplicated: Secondary | ICD-10-CM

## 2019-04-29 DIAGNOSIS — F4310 Post-traumatic stress disorder, unspecified: Secondary | ICD-10-CM | POA: Diagnosis not present

## 2019-04-30 ENCOUNTER — Other Ambulatory Visit: Payer: Self-pay

## 2019-04-30 ENCOUNTER — Other Ambulatory Visit (HOSPITAL_COMMUNITY): Payer: BC Managed Care – PPO

## 2019-04-30 NOTE — Progress Notes (Signed)
    Daily Group Progress Note  Program: CD-IOP   Group Time: 1pm-2pm Participation Level: Active Behavioral Response: Appropriate Type of Therapy: Process Group  Topic: Clinician checked in with group members, assessing for SI/HI/psychosis and overall level of functioning including relapse and barriers to recovery. Clinician and group members discussed highlights and struggles since last group related to maintaining sobriety including identified triggers and responses related to events around the holidays. Clinician and group members processed Daily Reflection and  personal meeting to current work in recovery. Clinician provided mindfulness activity.   Group Time: 2pm-4pm Participation Level: Active Behavioral Response: Appropriate Type of Therapy: Psycho-educational Group  Topic: Process group included clinician presented the topic of assertive communication. Clinician and group members discussed traits of passive, aggressive, and assertive communication. Clinician and group members reviewed Interpersonal Effectiveness skills and role played scenarios in session. Clinician and group members discussed possibilities or relationship changes based on use of assertive communication to maintain healthy boundaries.   Summary: Client met with PA-C briefly to discuss UDS. Client reports being distracted some of group due to positive screen for substance she is not taking but is able to re-direct her thoughts. Client also reports mind wandering and being upset about insurance not paying for her son's accident. Client reports overall positive holiday and was surprised at the time we got to spend with her sons. Client is able to provide support for communication skills with group members for effective communication. Client reports sometimes struggling with ruminating thoughts at night and no form of mindfulness meditations are helpful while trying to fall asleep.   Family Program: Family present? No    Name of family member(s): NA  UDS collected: Yes Results: pending  AA/NA attended?: Yes 1  Sponsor?: Yes   Olegario Messier, LCSW

## 2019-05-04 ENCOUNTER — Other Ambulatory Visit: Payer: Self-pay

## 2019-05-04 ENCOUNTER — Other Ambulatory Visit (HOSPITAL_COMMUNITY): Payer: BC Managed Care – PPO | Attending: Psychiatry | Admitting: Licensed Clinical Social Worker

## 2019-05-04 DIAGNOSIS — Z634 Disappearance and death of family member: Secondary | ICD-10-CM | POA: Insufficient documentation

## 2019-05-04 DIAGNOSIS — F1994 Other psychoactive substance use, unspecified with psychoactive substance-induced mood disorder: Secondary | ICD-10-CM

## 2019-05-04 DIAGNOSIS — Z79899 Other long term (current) drug therapy: Secondary | ICD-10-CM | POA: Insufficient documentation

## 2019-05-04 DIAGNOSIS — F5101 Primary insomnia: Secondary | ICD-10-CM | POA: Insufficient documentation

## 2019-05-04 DIAGNOSIS — F341 Dysthymic disorder: Secondary | ICD-10-CM | POA: Diagnosis not present

## 2019-05-04 DIAGNOSIS — F331 Major depressive disorder, recurrent, moderate: Secondary | ICD-10-CM

## 2019-05-04 DIAGNOSIS — F4322 Adjustment disorder with anxiety: Secondary | ICD-10-CM | POA: Insufficient documentation

## 2019-05-04 DIAGNOSIS — K7010 Alcoholic hepatitis without ascites: Secondary | ICD-10-CM | POA: Diagnosis not present

## 2019-05-04 DIAGNOSIS — F1020 Alcohol dependence, uncomplicated: Secondary | ICD-10-CM

## 2019-05-04 DIAGNOSIS — I10 Essential (primary) hypertension: Secondary | ICD-10-CM | POA: Diagnosis not present

## 2019-05-05 ENCOUNTER — Ambulatory Visit (INDEPENDENT_AMBULATORY_CARE_PROVIDER_SITE_OTHER): Payer: BC Managed Care – PPO | Admitting: Psychiatry

## 2019-05-05 ENCOUNTER — Other Ambulatory Visit: Payer: Self-pay

## 2019-05-05 DIAGNOSIS — F4321 Adjustment disorder with depressed mood: Secondary | ICD-10-CM | POA: Diagnosis not present

## 2019-05-05 DIAGNOSIS — F4381 Prolonged grief disorder: Secondary | ICD-10-CM

## 2019-05-05 NOTE — Progress Notes (Signed)
[facility] Counselor/Therapist Progress Note  Patient ID: [name], MRN: [id number],    Date: 05/05/2019  Time Spent: 60 minutes  12:00noon to 1:00pm  Treatment Type: Individual Therapy  Reported Symptoms: anxiety, 'but mostly grief", has not drank since Nov. 30, 2020  Mental Status Exam:  Appearance:   Casual     Behavior:  Appropriate, Sharing and Motivated  Motor:  Normal  Speech/Language:   Normal Rate  Affect:  Depressed  Mood:  anxious and depressed  Thought process:  normal  Thought content:    WNL  Sensory/Perceptual disturbances:    WNL  Orientation:  oriented to person, place, time/date, situation, day of week, month of year and year  Attention:  Good  Concentration:  Good  Memory:  WNL  Fund of knowledge:   Good  Insight:    Good  Judgment:   Good  Impulse Control:  Fair   Risk Assessment: Danger to Self:  No Self-injurious Behavior: No Danger to Others: No Duty to Warn:no Physical Aggression / Violence:No  Access to Firearms a concern: No  Gang Involvement:No   Subjective: Patient in today wanting more help with her grief, be less depressed, find joy, and move on with life. Is applying for jobs.    Interventions: Solution-Oriented/Positive Psychology and Grief Therapy  Diagnosis:   ICD-10-CM   1. 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  Treatment Goals: Goals remain on tx plan as patient works on strategies to meet her goals.  Progress will be noted each session in "Progress" section of Plan.  Long term goals: Reduce overall level, frequency, and intensity of grief and anxiety so that daily functioning is not impaired.  Short term goals: Increase understanding of beliefs and messages that lead to worry and anxiety, and how these relate to her grief process.  Strategy: Explore cognitive messages that lead to anxiety responses and retrain  in adaptive cognitions. (especially as these relate to her grief issues)  Progress: Patient has had lots of changes since last appt.  Drinking escalated later November and went to detox at [facility] and is still involved in IOP for another month.  Stopped drinking, last drink was Nov. 30th. Has a new sponsor and is attending AA daily, at least one daily and sometimes two, which is helpful for her.  Also a weekend AA meeting for women vial Zoom. Is totally focused on her SA issues at IOP but wants help here for her grief over husband's death. Patient describes lots of loneliness since death of husband. Wants to be happy again and move forward. Several friends have offered to be of support but she did not follow through. Describes nights as being the worst because I get lonely and bored.  Misses husband "terribly" and feel guilty for drinking so much when he was sick.  Was sober when he died.  He was against my drinking. Patient spoke about her needs:  Needs to reach out to others more.  Reading AA info helps--daily reflections. Praying daily each morning, including praying for herself. Needs to stop beating herself up, feels like she let her husband down.  With change in her situation and needs, we adjusted her tx goal plan above to better meet patient's needs.  Goal review and challenges noted as well as some progress, with patient.   Next appt within 1  week.   Mathis Fare, LCSW

## 2019-05-06 ENCOUNTER — Other Ambulatory Visit (INDEPENDENT_AMBULATORY_CARE_PROVIDER_SITE_OTHER): Payer: BC Managed Care – PPO | Admitting: Licensed Clinical Social Worker

## 2019-05-06 DIAGNOSIS — K7010 Alcoholic hepatitis without ascites: Secondary | ICD-10-CM

## 2019-05-06 DIAGNOSIS — F4321 Adjustment disorder with depressed mood: Secondary | ICD-10-CM

## 2019-05-06 DIAGNOSIS — F1020 Alcohol dependence, uncomplicated: Secondary | ICD-10-CM | POA: Diagnosis not present

## 2019-05-06 DIAGNOSIS — Z6372 Alcoholism and drug addiction in family: Secondary | ICD-10-CM

## 2019-05-06 DIAGNOSIS — F4329 Adjustment disorder with other symptoms: Secondary | ICD-10-CM

## 2019-05-06 DIAGNOSIS — F419 Anxiety disorder, unspecified: Secondary | ICD-10-CM

## 2019-05-06 DIAGNOSIS — F1994 Other psychoactive substance use, unspecified with psychoactive substance-induced mood disorder: Secondary | ICD-10-CM

## 2019-05-06 DIAGNOSIS — F341 Dysthymic disorder: Secondary | ICD-10-CM

## 2019-05-06 DIAGNOSIS — I10 Essential (primary) hypertension: Secondary | ICD-10-CM

## 2019-05-06 DIAGNOSIS — Z811 Family history of alcohol abuse and dependence: Secondary | ICD-10-CM

## 2019-05-06 DIAGNOSIS — F4381 Prolonged grief disorder: Secondary | ICD-10-CM

## 2019-05-06 DIAGNOSIS — F5101 Primary insomnia: Secondary | ICD-10-CM

## 2019-05-06 NOTE — Progress Notes (Signed)
   [facility] Follow-up Outpatient CDIOP Date: 05/06/2019  Admission Date: 04/06/19  Sobriety date: 03/29/19  Subjective: " Holidays were good"  HPI ; Pt seen for scheduled Provider FU. Pt reports she and sons seem to have reconciled over holidays. Younger son is now staying at house. Older son is back at school and talking on phone to her. She continues to seek employment.  She denies Codeine rx (UDS +).She will bring her meds Monday. Counselor noted decreased participation in group. When asked pt admitted Group is not as she is used to in terms of size and level of recovery motivation. She does not wish to discontinue and is going to consider the notion that at times  one gets out of something what one puts into it.    Review of Systems: Psychiatric: Agitation: No Hallucination: No Depressed Mood: several days Insomnia: nearly every day Hypersomnia: No Altered Concentration: More than 1/2 days        Feels Worthless: Several days Grandiose Ideas: No Belief In Special Powers: No New/Increased Substance Abuse: No/ ? False + UDS Compulsions: No  Neurologic: Headache: No Seizure: No Paresthesias: No  Current Medications: albuterol 108 (90 Base) MCG/ACT inhaler Commonly known as: VENTOLIN HFA Inhale into the lungs.  ARIPiprazole 5 MG tablet Commonly known as: ABILIFY Take 1 tablet (5 mg total) by mouth daily.  baclofen 10 MG tablet Commonly known as: LIORESAL Take 1 tablet (10 mg total) by mouth 3 (three) times daily.  cloNIDine 0.1 MG tablet Commonly known as: CATAPRES Take 1 tablet (0.1 mg total) by mouth 2 (two) times daily as needed.  doxepin 10 MG capsule Commonly known as: SINEQUAN Take 1-2 po QHS prn  Melatonin 10 MG Tabs Take by mouth.  multivitamin with minerals tablet Take 1 tablet by mouth daily.  naproxen sodium 220 MG tablet Commonly known as: ALEVE Take 220 mg by mouth daily as needed.  sertraline 100 MG tablet Commonly known as: ZOLOFT Take 2  tablets (200 mg total) by mouth daily.  traZODone 100 MG tablet Commonly known as: DESYREL Take 2 tablets (200 mg total) by mouth at bedtime.     Mental Status Examination  Appearance: Alert: Yes Attention: good  Cooperative: Yes Eye Contact: Good Speech: Clear and coherent Psychomotor Activity: Normal Memory/Concentration: Normal/intact Oriented: person, place, time/date and situation Mood: Euthymic Affect: Appropriate and Congruent Thought Processes and Associations: Coherent and Intact Fund of Knowledge: Good Thought Content: WDL Insight: Good Judgement: Good  UDS: Oxazepam gone (Librium metabolite) bur CODEINE + pt denies use Rx PDMP- 0  Diagnosis:  0  Alcohol use disorder, severe, dependence (HCC) 0 Grief reaction with prolonged bereavement 0 Family hx of alcoholism 0 Primary dysthymia early onset 0 Substance induced mood disorder (HCC) 0 Dysfunctional family due to alcoholism 0 Alcoholic hepatitis without ascites 0 Primary insomnia 0 Hypertension, unspecified type 0 Anxiety  Assessment: Maintaining abstinence Her goal of getting sons back appears to have been accomplished.  She is showing signs of decreased interest in group per Counselor.  Treatment Plan: Per Initial assessment Encouraged to consider putting more into group especially with new woman. Bring meds Monday Melinda Morn, PA-CPatient ID: [name], female   DOB: 1966/02/25, 54 y.o.   MRN: [id number]

## 2019-05-07 ENCOUNTER — Other Ambulatory Visit (HOSPITAL_COMMUNITY): Payer: BC Managed Care – PPO | Admitting: Licensed Clinical Social Worker

## 2019-05-07 ENCOUNTER — Other Ambulatory Visit: Payer: Self-pay

## 2019-05-07 DIAGNOSIS — F1020 Alcohol dependence, uncomplicated: Secondary | ICD-10-CM

## 2019-05-07 DIAGNOSIS — F4321 Adjustment disorder with depressed mood: Secondary | ICD-10-CM

## 2019-05-07 DIAGNOSIS — F1994 Other psychoactive substance use, unspecified with psychoactive substance-induced mood disorder: Secondary | ICD-10-CM

## 2019-05-07 DIAGNOSIS — F4329 Adjustment disorder with other symptoms: Secondary | ICD-10-CM

## 2019-05-07 DIAGNOSIS — F4381 Prolonged grief disorder: Secondary | ICD-10-CM

## 2019-05-07 NOTE — Progress Notes (Signed)
    Daily Group Progress Note  Program: CD-IOP   Group Time: 1pm-2:30pm Participation Level: Active Behavioral Response: Appropriate Type of Therapy: Process Group  Topic: Clinician checked in with group members, assessing for SI/HI/psychosis and overall level of functioning including cravings and substance use. Clinician inquired about sobriety date and meetings attended since last session. Clinician and group members discussed being sober on holidays often centered around drinking. Clinician and group members read and processed NA and AA Daily Reflections.   Group Time: 2:30pm-4pm Participation Level: Active Behavioral Response: Appropriate Type of Therapy: Psycho-education Group  Topic: Clinician provided mindfulness exercise on gratitude. Clinician presenting the topic of dysfunctional family roles Clinician provided psycho-educational information on family roles and worked with clients on identifying roles growing up and currently. Clinician and group members looked at family patterns including substance abuse, mental health, unstable relationships, and problems with anger. Clinician and group members checked out with self care activity planned before next group.     Summary: Client maintained sobriety date of 03/30/19. Client presented as distracted and disengaged, reporting she had lots of things on her mind, including needing to go to the [facility] and insurance related expenses. Client shared activities over new years eve and being thankful for fiends who included her in activities while being mindful of her sobriety. Client is able to identify how family roles have shifted in her current family based on her drinking and on her husbands death.   Family Program: Family present? No   Name of family member(s): NA  UDS collected: Yes Results: NA, specimen spilled in transport;   AA/NA attended?: Yes  Sponsor?: Yes   Harlon Ditty, LCSW

## 2019-05-08 ENCOUNTER — Ambulatory Visit: Payer: BC Managed Care – PPO | Admitting: Psychiatry

## 2019-05-11 ENCOUNTER — Encounter (HOSPITAL_COMMUNITY): Payer: Self-pay | Admitting: Medical

## 2019-05-11 ENCOUNTER — Other Ambulatory Visit: Payer: Self-pay

## 2019-05-11 ENCOUNTER — Other Ambulatory Visit (INDEPENDENT_AMBULATORY_CARE_PROVIDER_SITE_OTHER): Payer: BC Managed Care – PPO | Admitting: Licensed Clinical Social Worker

## 2019-05-11 DIAGNOSIS — F5101 Primary insomnia: Secondary | ICD-10-CM

## 2019-05-11 DIAGNOSIS — F1020 Alcohol dependence, uncomplicated: Secondary | ICD-10-CM

## 2019-05-11 DIAGNOSIS — F4381 Prolonged grief disorder: Secondary | ICD-10-CM

## 2019-05-11 DIAGNOSIS — F4329 Adjustment disorder with other symptoms: Secondary | ICD-10-CM

## 2019-05-11 DIAGNOSIS — F1994 Other psychoactive substance use, unspecified with psychoactive substance-induced mood disorder: Secondary | ICD-10-CM

## 2019-05-11 DIAGNOSIS — F4321 Adjustment disorder with depressed mood: Secondary | ICD-10-CM

## 2019-05-11 DIAGNOSIS — Z6372 Alcoholism and drug addiction in family: Secondary | ICD-10-CM

## 2019-05-11 DIAGNOSIS — Z811 Family history of alcohol abuse and dependence: Secondary | ICD-10-CM

## 2019-05-11 DIAGNOSIS — I10 Essential (primary) hypertension: Secondary | ICD-10-CM

## 2019-05-11 DIAGNOSIS — F341 Dysthymic disorder: Secondary | ICD-10-CM

## 2019-05-11 NOTE — Progress Notes (Signed)
Patient ID: [name], female   DOB: 1965/11/24, 54 y.o.   MRN: [id number] S- Pt Drug Screen 04/27/19 was + for low level of Codeine (64 ng Cutoff 50ng/ml) She brings her meds today for review.PDMP review is 0 O- Medications correspond to current list.No Codeine.PDMP score is 0 for any controlled substance A-False + UDS P-Ask lab to review Drug Screen

## 2019-05-12 ENCOUNTER — Ambulatory Visit (INDEPENDENT_AMBULATORY_CARE_PROVIDER_SITE_OTHER): Payer: BC Managed Care – PPO | Admitting: Psychiatry

## 2019-05-12 ENCOUNTER — Other Ambulatory Visit: Payer: Self-pay

## 2019-05-12 DIAGNOSIS — F4321 Adjustment disorder with depressed mood: Secondary | ICD-10-CM | POA: Diagnosis not present

## 2019-05-12 DIAGNOSIS — F4381 Prolonged grief disorder: Secondary | ICD-10-CM

## 2019-05-12 NOTE — Progress Notes (Signed)
[facility] Counselor/Therapist Progress Note  Patient ID: TATIANA COURTER, MRN: [id number],    Date: 05/12/2019  Time Spent: 60 minutes  12:00noon to 1:00pm   Treatment Type: Individual Therapy  Reported Symptoms: grief, some anger towards deceased husband  Mental Status Exam:  Appearance:   Casual     Behavior:  Appropriate and Sharing  Motor:  Normal  Speech/Language:   Normal Rate  Affect:  anxious  Mood:  anxious  Thought process:  normal  Thought content:    WNL  Sensory/Perceptual disturbances:    WNL  Orientation:  oriented to person, place, time/date, situation, day of week, month of year and year  Attention:  Good  Concentration:  Good  Memory:  WNL  Fund of knowledge:   Good  Insight:    Good  Judgment:   Good  Impulse Control:  Good   Risk Assessment: Danger to Self:  No Self-injurious Behavior: No Danger to Others: No Duty to Warn:no Physical Aggression / Violence:No  Access to Firearms a concern: No  Gang Involvement:No   Subjective:  Patient in today to work further on her unresolved grief re: husband's death.Working her 4th step (involves her anger at deceased husband, anger he left me, anger at God sometimes that "he had stage 4 lung cancer and never smoked.")   Interventions: Solution-Oriented/Positive Psychology and Grief Therapy  Diagnosis:   ICD-10-CM   1. 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  Treatment Goals: Goals remain on tx plan as patient works on strategies to meet her goals. Progress will be noted each session in "Progress" section of Plan.  Long term goals: Reduce overall level, frequency, and intensity of grief and anxiety so that daily functioning is not impaired.  Short term goals: Increase understanding of beliefs and messages that lead to worry and anxiety, and how these relate to her grief  process.  Strategy: Explore cognitive messages that lead to anxiety responses and retrain in adaptive cognitions. (especially as these relate to her grief issues)  Progress: Patient did follow up on goal-related action steps.  Did reach out to 4 friends to connect and get together.  Also connected with sister-in-law. Shared today's AA devotional about "prayer" and how she knows she needs to keep doing this daily. Daily reflections from AA are helpful to her in forming habits that are healthy and calming to patient, helping her think about "where I'm going from here."  "I've spent my life being a wife and mother and now I'm not sure what I want to do."  Is praying for herself daily for "calmness and peace, and discern what I want to do at this point and moving forward. Feels her faith is important to her healing. Doing well with following the AA program and staying in touch with her AA sponsor. Still processing her guilt feelingsof letting husband down prior to his death as her drinking as a way of coping was more important than his Stage 4 cancer. Working to let go of more things that "I cannot control and focus on the things that I can change or impact."  To continue attending her AA meeting and she is very committed to that, attending daily and sometimes twice daily. Goal review and progress noted with patient.   Next appt within 2-3 weeks.   Mathis Fare, LCSW

## 2019-05-13 ENCOUNTER — Other Ambulatory Visit (HOSPITAL_COMMUNITY): Payer: BC Managed Care – PPO

## 2019-05-13 ENCOUNTER — Other Ambulatory Visit: Payer: Self-pay

## 2019-05-13 ENCOUNTER — Encounter: Payer: Self-pay | Admitting: Medical

## 2019-05-14 ENCOUNTER — Other Ambulatory Visit: Payer: Self-pay

## 2019-05-14 ENCOUNTER — Other Ambulatory Visit (HOSPITAL_COMMUNITY): Payer: BC Managed Care – PPO | Admitting: Licensed Clinical Social Worker

## 2019-05-14 DIAGNOSIS — F1020 Alcohol dependence, uncomplicated: Secondary | ICD-10-CM | POA: Diagnosis not present

## 2019-05-14 DIAGNOSIS — F4381 Prolonged grief disorder: Secondary | ICD-10-CM

## 2019-05-14 DIAGNOSIS — F4321 Adjustment disorder with depressed mood: Secondary | ICD-10-CM

## 2019-05-14 DIAGNOSIS — F331 Major depressive disorder, recurrent, moderate: Secondary | ICD-10-CM

## 2019-05-15 NOTE — Progress Notes (Signed)
    Daily Group Progress Note  Program: CD-IOP   Group Time: 1pm-2:30pm Participation Level: Active Behavioral Response: Appropriate Type of Therapy: Process Group  Topic: Clinician met with clients, assessing for SI/HI/psychosis and overall level of functioning. Clinician and group members processed highs and lows of the weekend, reviewing triggers and skills used to maintain sobriety. Clinician and group members read daily reflections and discussed the role of spirituality in recovery. Clinician provided  Personal Mission Statement activity and processed with clients easy and difficult sentences based on place in recovery.    Group Time: 2:30pm-4pm Participation Level: Active Behavioral Response: Appropriate Type of Therapy: Psycho-education Group  Topic: Clinician presented psycho-educational on Co-dependency. Clinician provided client with co-dependency scale and reviewed characteristics of caregiving, self-worth, and dependency. Clients discussed differences in caregiving vs caretaking and sense of responsibility differing based on types of relationships. Clinician provided brief psycho-education on attachment styles and how households with addiction can alter what acceptable/commonly viewed at home vs health relationships look like. Clinician inquired about self-care activity to complete before the next group.    Summary: Client reports having a 'calm' weekend. Client attended meetings daily showing progress in actively engaging in recovery. Client denies having many co-dependency traits, thought can identify some caretaking behaviors due to being a mother. Client does identify mirroring family traits, such as raising voice when disciplining like her mother, where her husband would not.   Family Program: Family present? No   Name of family member(s): NA  UDS collected: No Results: N/A  AA/NA attended?: Yes at least daily  Sponsor?: Yes   Olegario Messier,  LCSW

## 2019-05-18 ENCOUNTER — Encounter (HOSPITAL_COMMUNITY): Payer: BC Managed Care – PPO

## 2019-05-18 ENCOUNTER — Telehealth (HOSPITAL_COMMUNITY): Payer: Self-pay | Admitting: Licensed Clinical Social Worker

## 2019-05-19 ENCOUNTER — Ambulatory Visit (INDEPENDENT_AMBULATORY_CARE_PROVIDER_SITE_OTHER): Payer: BC Managed Care – PPO | Admitting: Psychiatry

## 2019-05-19 ENCOUNTER — Other Ambulatory Visit: Payer: Self-pay

## 2019-05-19 DIAGNOSIS — F4321 Adjustment disorder with depressed mood: Secondary | ICD-10-CM | POA: Diagnosis not present

## 2019-05-19 DIAGNOSIS — F4381 Prolonged grief disorder: Secondary | ICD-10-CM

## 2019-05-19 NOTE — Progress Notes (Signed)
[facility] Counselor/Therapist Progress Note  Patient ID: [name], MRN: [id number],    Date: 05/19/2019  Time Spent: 55 minutes 12:05pm to 1:00pm  Treatment Type: Individual Therapy  Reported Symptoms: grief, depression, some anxiety  Mental Status Exam:  Appearance:   Casual     Behavior:  Appropriate, Sharing and Motivated  Motor:  Normal  Speech/Language:   Normal Rate  Affect:  some tearfulness, grief  Mood:  sad  Thought process:  goal directed  Thought content:    WNL  Sensory/Perceptual disturbances:    WNL  Orientation:  oriented to person, place, time/date, situation, day of week, month of year and year  Attention:  Good  Concentration:  Good  Memory:  WNL  Fund of knowledge:   Good  Insight:    Good  Judgment:   Good  Impulse Control:  Good   Risk Assessment: Danger to Self:  No Self-injurious Behavior: No Danger to Others: No Duty to Warn:no Physical Aggression / Violence:No  Access to Firearms a concern: No  Gang Involvement:No   Subjective: Patient in today reporting she is still feeling grief feeling and also doing well with her AA plan and attending multiple meetings.  Sometimes depressed and wanting to isolate, but if I isolate, I end up feeling worse. Patient openly shared in [place] meeting yesterday which was good for her.  Met with AA sponsor yesterday also.  Interventions: Grief Therapy  Diagnosis:   ICD-10-CM   1. 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  Treatment Goals: Goals remain on tx plan as patient works on strategies to meet her goals. Progress will be noted each session in "Progress" section of Plan.  Long term goals: Reduce overall level, frequency, and intensity ofgrief andanxiety so that daily functioning is not impaired.  Short term goals: Increase understanding of beliefs and messages that lead to worry and anxiety,  and how these relate to her grief process.  Strategy: Explore cognitive messages that lead to anxiety responses and retrain in adaptive cognitions.(especially as these relate to her grief issues)  Progress: Patient in today working further on unresolved grief of her husband's death. Using strategy above, looked at some of the cognitive messages that lead her to feel anxious at times re: certain grief issues. States "gets lonely around dinner time" and feels her grief and some anxiety more acutely.  Also missing him at bedtime or waking up and he was in the midst of her dreaming.  Sadness and longing for him. Worked hard on some denial as well as "what helps and what does not help".  Talked through more of her grief today as she found it helpful last sessions. Oldest son can be hurtful and verbally demanding of patient. Looked at ways that she might could better handle this type of behavior, as she does not want to confront him. He may be "fearing that I might relapse again."  Processed her relationship with him and her understanding of why he treats her the way that he does.  Has some good insight.  Remains alcohol-free. Continues contact with 4 friends and her sister-in-law. Relying more on her faith which is helping her feel stronger.  Working on the guilt surrounding her drinking as husband's cancer worsened prior to his death. Also continues working to accept what she cannot change in the past, and to practice self-forgiveness. Goal  review and progress noted with patient.    Next appt within 1-2 weeks.   Shanon Ace, LCSW

## 2019-05-19 NOTE — Progress Notes (Signed)
    Daily Group Progress Note  Program: CD-IOP   Group Time: 1pm-2:30pm Participation Level: Active Behavioral Response: Appropriate Type of Therapy: Process Group  Topic: Clinician met with group, assessing for SI/HI/psychosis and overall level of functioning. Clinician and group members processed recent stressors and effect on recovery. Clinician facilitated process group related to changes in family dynamics due to alcohol use, and related resentment and shame, and grief.    Group Time: 2:30pm-4pm Participation Level: Minimal Behavioral Response: Resistant Type of Therapy: Psycho-education Group  Topic: Clinician presented psycho-educational information on WHAT/HOW DBT skills and practicing non-judgmental view of thoughts, self, and others. Clinician presented STOPP skill. Clinician and clients engaged in [place], following with discussion on level of effectiveness in relation to mood. Group celebrated one members successful completion of CDIOP.   Summary: Client maintains sobriety date of 03/30/19. Client engaged in processing with group members changes in relationship with both sons and resentment accompanying grief of her husband passing. Client participated in [place], reporting it would not likely be a skill to use outside of session.   Family Program: Family present? No   Name of family member(s): NA  UDS collected: Yes Results: UDS from 05/13/19 positive for alcohol  AA/NA attended?: Yes; at least daily Sponsor?: Yes   Olegario Messier, LCSW

## 2019-05-19 NOTE — Progress Notes (Signed)
    Daily Group Progress Note  Program: CD-IOP   Group Time: 1pm-2:30pm Participation Level: Active Behavioral Response: Appropriate Type of Therapy: Process Group  Topic: Clinician met with clients, assessing for SI/HI/psychosis and overall level of function. Clinician and group members discussed highs/lows and concerns with cravings as well as skills used to address unhelpful thoughts. Clinician and group members read and processed Daily reflections related to [place]. Clinician provided progressive muscle relaxation activity.   Group Time: 2:30pm-4pm Participation Level: Active Behavioral Response: Appropriate Type of Therapy: Psycho-education Group  Topic: Clinician and group members utilized [place] activity to discuss vulnerabilities using 'Loaded Questions.' Clinician and group members discussed how substance use and/or recovery had an effect on feelings on manipulation, emotional withdrawal, rejection, and abandonment. Group members were provided opportunity to practice anxiety management skills during activity.   Summary: Client reports sobriety date of 03/30/19. Client reports continuing to engage in [place] mtgs and remaining busy. Client was thankful for continued engagement from oldest son, which was somewhat surprising after the holidays. Client reports often making decisions based on fear of rejection or avoiding abandonment. Client finds it frustrating not being able to find a job which she would enjoy.  Family Program: Family present? No   Name of family member(s): NA  UDS collected: No Results: spilled in transit  AA/NA attended?: Yes  Sponsor?: Yes   Olegario Messier, LCSW

## 2019-05-19 NOTE — Progress Notes (Signed)
    Daily Group Progress Note  Program: CD-IOP   Group Time: 1pm-2:30pm Participation Level: Active Behavioral Response: Appropriate Type of Therapy: Process Group  Topic: Clinician met with group, assessing for SI/HI/psychosis and overall level of functioning. Clinician and group members discussed recent highs/lows and any struggles with cravings. Clinician and group members read and processed AA Daily Reflection and NA Just for Today Reading and how this applies to current place in recovery. Clinician provided mindfulness activity.    Group Time: 2:30pm-4pm Participation Level: Active Behavioral Response: Appropriate Type of Therapy: Psycho-education Group  Topic: Clinician presented the psycho-educational topic of Adult Children of Alcoholics (ACOA). Clinician reviewed with clients the 'Laundry List' and common thought and behavior patterns of families with substance use in the home. Clinician and group members discussed how this affected believes/behaviors in work, intimate and interpersonal relationships. Clinician and group members discussed specifically the importance or lack of importance of knowing intentions of behaviors. Clinician provided clients with '10 Things Adult Children of Addicts Wants You To Know."   Summary: Client sobriety date remains 03/30/19. Client reports limited connection to topic due to neither parent being an alcoholic. When challenged client did begin to identify traits of ACOA in her own children. Client shows some progress toward goals AEB maintaining sobriety however presents as distracted in group, often checking phone and being frustrated and lack of call back from jobs and interactions with insurance companies.   Family Program: Family present? No   Name of family member(s): NA  UDS collected: Yes   AA/NA attended?: Yes  Sponsor?: Yes   Olegario Messier, LCSW

## 2019-05-20 ENCOUNTER — Encounter (HOSPITAL_COMMUNITY): Payer: BC Managed Care – PPO

## 2019-05-21 ENCOUNTER — Encounter (HOSPITAL_COMMUNITY): Payer: BC Managed Care – PPO

## 2019-05-22 ENCOUNTER — Telehealth (HOSPITAL_COMMUNITY): Payer: Self-pay | Admitting: Licensed Clinical Social Worker

## 2019-05-25 ENCOUNTER — Other Ambulatory Visit: Payer: Self-pay

## 2019-05-25 ENCOUNTER — Other Ambulatory Visit (HOSPITAL_COMMUNITY): Payer: BC Managed Care – PPO | Admitting: Licensed Clinical Social Worker

## 2019-05-25 DIAGNOSIS — F1020 Alcohol dependence, uncomplicated: Secondary | ICD-10-CM

## 2019-05-25 DIAGNOSIS — F1994 Other psychoactive substance use, unspecified with psychoactive substance-induced mood disorder: Secondary | ICD-10-CM

## 2019-05-25 NOTE — Progress Notes (Signed)
Patient ID: [name], female   DOB: January 26, 1966, 54 y.o.   MRN: [id number]

## 2019-05-25 NOTE — Progress Notes (Deleted)
    Daily Group Progress Note  Program: CD-IOP   Group Time: 1-2:30pm  Participation Level: Active  Behavioral Response: Appropriate  Type of Therapy: Process Group  Topic: Clinician facilitated a check in and asked group members to identify their name, sobriety date, and how many recovery or support meetings they have attended since our last session. Clinician encouraged group members to process their recent weekend and any stressors, praises, triggers, or struggles with relapse that they may have dealt with. Clinician utilized active listening and provided praise for group members throughout the check in process.   Group Time: 2:30-4pm  Participation Level: Active  Behavioral Response: Appropriate  Type of Therapy: Psycho-education Group  Topic: Clinician presented a psychoeducational handout on values and initiated discussion on ways that personal values may influence thoughts, feelings, behaviors, or relationships. Clinician provided information on value congruency aligning with behaviors and presented an activity in which members identified values that their behaviors are both congruent and incongruent with. Members engaged in discussion on their personal values and how values have impacted their sobriety and recovery.    Summary: Client checked in and reported a sobriety date of 05/13/19 and reported having attended 10 meetings since our last group session. Client actively engaged throughout group discussion and remained appropriate with her responses. Client provided appropriate and direct feedback to a fellow group member regarding the association with others who are still using while one is working towards sobriety.     Family Program: Family present? No   Name of family member(s): n/a  UDS collected: Yes Results: previous test positive for alcohol  AA/NA attended?: Yes  Sponsor?: Yes   Maryjean Morn, PA-C

## 2019-05-25 NOTE — Progress Notes (Signed)
  Daily Group Progress Note  Program: CD-IOP   Group Time: 1-2:30pm  Participation Level: Active  Behavioral Response: Appropriate  Type of Therapy: Process Group  Topic: Clinician facilitated a check in and asked group members to identify their name, sobriety date, and how many recovery or support meetings they have attended since our last session. Clinician encouraged group members to process their recent weekend and any stressors, praises, triggers, or struggles with relapse that they may have dealt with. Clinician utilized active listening and provided praise for group members throughout the check in process.   Group Time: 2:30-4pm  Participation Level: Active  Behavioral Response: Appropriate  Type of Therapy: Psycho-education Group  Topic: Clinician presented a psychoeducational handout on values and initiated discussion on ways that personal values may influence thoughts, feelings, behaviors, or relationships. Clinician provided information on value congruency aligning with behaviors and presented an activity in which members identified values that their behaviors are both congruent and incongruent with. Members engaged in discussion on their personal values and how values have impacted their sobriety and recovery.    Summary: Client checked in and reported a sobriety date of 05/13/19 and reported having attended 10 meetings since our last group session. Client actively engaged throughout group discussion and remained appropriate with her responses. Client provided appropriate and direct feedback to a fellow group member regarding the association with others who are still using while one is working towards sobriety.     Family Program: Family present? No   Name of family member(s): n/a  UDS collected: Yes Results: previous test positive for alcohol  AA/NA attended?: Yes  Sponsor?: Yes

## 2019-05-25 NOTE — Progress Notes (Signed)
Musculoskeletal: Strength & Muscle Tone: within normal limits Gait & Station: normal Patient leans: N/A Patient ID: [name], female   DOB: [birth year], 54 y.o.   MRN: [id number]

## 2019-05-26 ENCOUNTER — Other Ambulatory Visit: Payer: Self-pay

## 2019-05-26 ENCOUNTER — Other Ambulatory Visit: Payer: Self-pay | Admitting: Psychiatry

## 2019-05-26 ENCOUNTER — Ambulatory Visit (INDEPENDENT_AMBULATORY_CARE_PROVIDER_SITE_OTHER): Payer: BC Managed Care – PPO | Admitting: Psychiatry

## 2019-05-26 DIAGNOSIS — F4321 Adjustment disorder with depressed mood: Secondary | ICD-10-CM | POA: Diagnosis not present

## 2019-05-26 DIAGNOSIS — F5101 Primary insomnia: Secondary | ICD-10-CM

## 2019-05-26 DIAGNOSIS — F4381 Prolonged grief disorder: Secondary | ICD-10-CM

## 2019-05-26 NOTE — Progress Notes (Signed)
      [facility] Counselor/Therapist Progress Note  Patient ID: [name], MRN: [id number],    Date: 05/26/2019  Time Spent: 60 minutes 12:00noon to 1:00pm  Treatment Type: Individual Therapy  Reported Symptoms:  Unresolved grief issues from husband's death and within family relationships  Mental Status Exam:  Appearance:   Casual     Behavior:  Appropriate and Sharing  Motor:  Normal  Speech/Language:   Normal Rate  Affect:  grief  Mood:  anxious  Thought process:  goal directed  Thought content:    WNL  Sensory/Perceptual disturbances:    WNL  Orientation:  oriented to person, place, time/date, situation, day of week, month of year and year  Attention:  Good  Concentration:  Good  Memory:  WNL  Fund of knowledge:   Good  Insight:    Good  JudgmentPeri [place] /Fair  Impulse Control:  Good / Fair   Risk Assessment: Danger to Self:  No Self-injurious Behavior: No Danger to Others: No Duty to Warn:no Physical Aggression / Violence:No  Access to Firearms a concern: No  Gang Involvement:No   Subjective: Patient in today reporting more depressed this past week due to my decreased motivation but "I still did everything I was supposed to do." Issues with older son did not go well.   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  Treatment Goals: Goals remain on tx plan as patient works on strategies to meet her goals. Progress will be noted each session in "Progress" section of Plan.  Long term goals: Reduce overall level, frequency, and intensity ofgrief andanxiety so that daily functioning is not impaired.  Short term goals: Increase understanding of beliefs and messages that lead to worry and anxiety, and how these relate to her grief  process.  Strategy: Explore cognitive messages that lead to anxiety responses and retrain in adaptive cognitions.(especially as these relate to her grief issues)  Progress: Patient today processing issues with her oldest son (critical). Forgiveness issues and some not understanding per patient.  Also her Dad having open wound on his head that "will be lasting", an infection of the bone and it won't close up from what doctors say.  States that even thought she is some more depressed right now, there is not the temptation to drink. Some dreams about deceased husband that she shared today." I try to avoid pain and find that crying sometimes helps to just get it out." What helps is to call a friend or my sister. Reports still" missing husband at meals or bedtime but I am getting better and the longing for him is not quite as bad."  Remains alcohol-free. Wanting to use prayer life more. When upset or struggling, I get impatient with myelf--what helps is calling a friend. Continues her contact with closer friends and sister-in-law. To work further on her guilt and self-forgiveness about drinking as husband's cancer got worse before dying, and follow up on this next session. Goal review and progress noted with patient.  Next appt in 2 weeks.   Melinda Fare, LCSW

## 2019-05-26 NOTE — Telephone Encounter (Signed)
Last apt 12/09, due back 4 weeks

## 2019-05-27 ENCOUNTER — Other Ambulatory Visit (HOSPITAL_COMMUNITY): Payer: BC Managed Care – PPO | Admitting: Licensed Clinical Social Worker

## 2019-05-27 DIAGNOSIS — F1020 Alcohol dependence, uncomplicated: Secondary | ICD-10-CM | POA: Diagnosis not present

## 2019-05-27 DIAGNOSIS — F4321 Adjustment disorder with depressed mood: Secondary | ICD-10-CM

## 2019-05-27 DIAGNOSIS — F331 Major depressive disorder, recurrent, moderate: Secondary | ICD-10-CM

## 2019-05-27 DIAGNOSIS — Z6372 Alcoholism and drug addiction in family: Secondary | ICD-10-CM

## 2019-05-27 DIAGNOSIS — F4381 Prolonged grief disorder: Secondary | ICD-10-CM

## 2019-05-27 DIAGNOSIS — F4329 Adjustment disorder with other symptoms: Secondary | ICD-10-CM

## 2019-05-28 ENCOUNTER — Other Ambulatory Visit (HOSPITAL_COMMUNITY): Payer: BC Managed Care – PPO | Admitting: Licensed Clinical Social Worker

## 2019-05-28 ENCOUNTER — Other Ambulatory Visit: Payer: Self-pay

## 2019-05-28 NOTE — Progress Notes (Signed)
    Daily Group Progress Note  Program: CD-IOP   Group Time: 1pm-2:30pm  Participation Level: Active  Behavioral Response: Appropriate  Type of Therapy: Process Group  Topic: Clinician checked in with group members, assessing for SI/HI/psychosis and overall level of functioning, including cravings, relapse, and participation in community support meetings. Clinician and group members processed 'highs' and 'lows' since last group and how challenges effected their thoughts or behaviors. Clinician and group members read and processed AA Daily Reflection related to Guilt.   Group Time: 2:30pm-4pm  Participation Level: Active  Behavioral Response: Appropriate  Type of Therapy: Psycho-education Group  Topic: Clinician provided interactive mindfulness activity 'Handful of Quiet.' Clinician presented the psycho-education topic of Guilt and Shame. Clinician presented Dewain Penning video 'Listening to Shame.' Clinician and group members processed thoughts, feelings, behaviors, and previous events related to Guilt, Toxic Guilt, and Shame. Clinician provided active listening, summarizing statements and validated client feelings. Clinician presented the option to replace 'I'm sorry' when shaming with 'Thank you for.' to avoid inappropriate guilt with gratitude. Clinician and group celebrated graduation of group member.    Summary: Client discussed with group guilt related to behaviors around husbands illness. Client shared about oldest son purposefully making her feel guilty which added to triggers to drink. Client reports shame around her addiction as well and how she fulfilled her roll as a mother and wife in previous years. Client states working on self forgiveness however believes it is more likely to be acceptance of behaviors rather than changed feelings for the time.   Family Program: Family present? No   Name of family member(s): NA  UDS collected: No Results: not sufficient quantity  from UDS sent on 05/25/19.  AA/NA attended?: Yes  Sponsor?: Yes   Harlon Ditty, LCSW

## 2019-06-01 ENCOUNTER — Other Ambulatory Visit: Payer: Self-pay

## 2019-06-01 ENCOUNTER — Other Ambulatory Visit (HOSPITAL_COMMUNITY): Payer: BC Managed Care – PPO | Attending: Psychiatry | Admitting: Licensed Clinical Social Worker

## 2019-06-01 ENCOUNTER — Other Ambulatory Visit: Payer: Self-pay | Admitting: Psychiatry

## 2019-06-01 DIAGNOSIS — Z811 Family history of alcohol abuse and dependence: Secondary | ICD-10-CM | POA: Diagnosis not present

## 2019-06-01 DIAGNOSIS — Z881 Allergy status to other antibiotic agents status: Secondary | ICD-10-CM | POA: Diagnosis not present

## 2019-06-01 DIAGNOSIS — I10 Essential (primary) hypertension: Secondary | ICD-10-CM | POA: Insufficient documentation

## 2019-06-01 DIAGNOSIS — F1994 Other psychoactive substance use, unspecified with psychoactive substance-induced mood disorder: Secondary | ICD-10-CM | POA: Insufficient documentation

## 2019-06-01 DIAGNOSIS — L239 Allergic contact dermatitis, unspecified cause: Secondary | ICD-10-CM | POA: Insufficient documentation

## 2019-06-01 DIAGNOSIS — F4320 Adjustment disorder, unspecified: Secondary | ICD-10-CM | POA: Diagnosis not present

## 2019-06-01 DIAGNOSIS — F341 Dysthymic disorder: Secondary | ICD-10-CM

## 2019-06-01 DIAGNOSIS — Z6372 Alcoholism and drug addiction in family: Secondary | ICD-10-CM

## 2019-06-01 DIAGNOSIS — F410 Panic disorder [episodic paroxysmal anxiety] without agoraphobia: Secondary | ICD-10-CM | POA: Diagnosis not present

## 2019-06-01 DIAGNOSIS — F5101 Primary insomnia: Secondary | ICD-10-CM | POA: Insufficient documentation

## 2019-06-01 DIAGNOSIS — F1021 Alcohol dependence, in remission: Secondary | ICD-10-CM

## 2019-06-01 DIAGNOSIS — F1020 Alcohol dependence, uncomplicated: Secondary | ICD-10-CM | POA: Insufficient documentation

## 2019-06-01 DIAGNOSIS — K7010 Alcoholic hepatitis without ascites: Secondary | ICD-10-CM | POA: Diagnosis not present

## 2019-06-01 DIAGNOSIS — Z79899 Other long term (current) drug therapy: Secondary | ICD-10-CM | POA: Insufficient documentation

## 2019-06-01 NOTE — Progress Notes (Signed)
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 Sobriety date: 05/31/2018  Allergies:  Allergies  Allergen Reactions  . Erythromycin     Stomach  cramps     Current Medications: Current Outpatient Medications  Medication Sig Dispense Refill  . albuterol (VENTOLIN HFA) 108 (90 Base) MCG/ACT inhaler Inhale into the lungs.    . ARIPiprazole (ABILIFY) 5 MG tablet Take 1 tablet (5 mg total) by mouth daily. 30 tablet 1  . baclofen (LIORESAL) 10 MG tablet Take 1 tablet (10 mg total) by mouth 3 (three) times daily. 90 tablet 1  . cloNIDine (CATAPRES) 0.1 MG tablet Take 1 tablet (0.1 mg total) by mouth 2 (two) times daily as needed. 60 tablet 1  . doxepin (SINEQUAN) 10 MG capsule TAKE 1 TO 2 TABLETS BY MOUTH AT BEDTIME AS NEEDED 60 capsule 0  . Melatonin 10 MG TABS Take by mouth.    . Multiple Vitamins-Minerals (MULTIVITAMIN WITH MINERALS) tablet Take 1 tablet by mouth daily.    . naproxen sodium (ALEVE) 220 MG tablet Take 220 mg by mouth daily as needed.    . sertraline (ZOLOFT) 100 MG tablet Take 2 tablets (200 mg total) by mouth daily. 60 tablet 1  . traZODone (DESYREL) 100 MG tablet TAKE 2 TABLETS BY MOUTH AT BEDTIME. 30 tablet 1   No current facility-administered medications for this visit.   Review of Systems: Psychiatric: Agitation: No Hallucination: No Depressed Mood: several days Insomnia: nearly every day Hypersomnia: No Altered Concentration: More than 1/2 days                                                                                   Feels Worthless: Several days Grandiose Ideas: No Belief In Special Powers: No New/Increased Substance Abuse: Drank alcohol 3-4 days Compulsions: Ongoing  Neurologic: Headache: No Seizure: No Paresthesias:  No   Mental Status Examination  Appearance: Alert: Yes Attention: good  Cooperative: Yes Eye Contact: Good Speech: Clear and coherent Psychomotor Activity: Normal Memory/Concentration: Normal/intact Oriented: person, place, time/date and situation Mood: Euthymic Affect: Appropriate and Congruent Thought Processes and Associations: Coherent and Intact Fund of Knowledge: Good Thought Content: WDL Insight:Limited Judgement: Impaired  Screenings: AUDIT     Admission (Discharged) from 11/25/2015 in [facility]IORAL HEALTH OBSERVATION UNIT  Alcohol Use Disorder Identification Test Final Score (AUDIT)  13    GAD-7     Counselor from 05/06/2019 in [facility]IORAL HEALTH INTENSIVE CHEMICAL DEPENDENCY Counselor from 04/03/2019 in BEHAVIORAL HEALTH OUTPATIENT THERAPY [place] Counselor from 02/09/2016 in BEHAVIORAL HEALTH INTENSIVE CHEMICAL DEPENDENCY Counselor from 01/25/2016 in BEHAVIORAL HEALTH INTENSIVE CHEMICAL DEPENDENCY  Total GAD-7 Score  11  5  4  4     PHQ2-9     Counselor from 05/06/2019 in BEHAVIORAL HEALTH INTENSIVE CHEMICAL DEPENDENCY Counselor from 04/03/2019 in BEHAVIORAL HEALTH OUTPATIENT THERAPY [place] Counselor from 02/09/2016 in BEHAVIORAL HEALTH INTENSIVE CHEMICAL DEPENDENCY Counselor from 01/25/2016 in BEHAVIORAL HEALTH INTENSIVE CHEMICAL DEPENDENCY Office Visit from 02/06/2013 in 04/08/2013 [facility]  PHQ-2 Total Score  2  2  0  0  0  PHQ-9 Total Score  10  10  -  -  -     BREAKING ADDICTION CYCLE WORK SHEET Pt completed assignment and reviewed with this practitioner. She continues to block/claim no memory for starting using again?   Visit Diagnosis: 0 Alcohol use disorder, severe, dependence ([place]) 0 Substance induced mood disorder ([facility]) 0 Alcohol use disorder, severe, in early remission, dependence ([place]) 0 Family hx of alcoholism 0 Dysfunctional family due to alcoholism 0 Primary dysthymia early onset 0 Primary insomnia 0 Alcoholic hepatitis without  ascites  Treatment Plan: Per Initial assessment Completet CBT worksheet for repetitive behavior without cognition and return 1 week  Darlyne Russian, PA-C 06/01/2019, 3:25 PM

## 2019-06-01 NOTE — Progress Notes (Signed)
    Daily Group Progress Note  Program: CD-IOP   Group Time: 1-2:30pm  Participation Level: Active  Behavioral Response: Appropriate  Type of Therapy: Process Group  Topic: Clinician met with group assessing SI/HI/psychosis and overall level of functioning, including strengths and struggles with recovery. Clinician and group members processed the recent weekend and any current concerns, stressors, or praises that they may have. Clinician and group members read and processed Daily reflection.   Group Time: 2:30-4pm  Participation Level: Active  Behavioral Response: Appropriate  Type of Therapy: Psycho-education Group  Topic: Psycho-educational group co-facilitated by fellow therapist who taught basic relaxation, breathing, stretching, and yoga skills. Facilitator encouraged group members to identify what they found helpful in today's exercise and encouraged members to engage in these practices moving forward to improve emotional regulation.    Summary: Client reported a sobriety date of 05/13/19 and reported having attended 4 support meetings since our last group session. Client provided an update on her weekend and was transparent with her ability to relate to relational issues that fellow members are currently experiencing.    Family Program: Family present? No   Name of family member(s): n/a  UDS collected: Yes Results: UDS results pending  AA/NA attended?: Yes  Sponsor?: Yes   Olegario Messier, LCSW

## 2019-06-03 ENCOUNTER — Other Ambulatory Visit: Payer: Self-pay

## 2019-06-03 ENCOUNTER — Other Ambulatory Visit (HOSPITAL_COMMUNITY): Payer: BC Managed Care – PPO | Admitting: Licensed Clinical Social Worker

## 2019-06-03 DIAGNOSIS — Z6372 Alcoholism and drug addiction in family: Secondary | ICD-10-CM

## 2019-06-03 DIAGNOSIS — F1020 Alcohol dependence, uncomplicated: Secondary | ICD-10-CM | POA: Diagnosis not present

## 2019-06-03 DIAGNOSIS — F331 Major depressive disorder, recurrent, moderate: Secondary | ICD-10-CM

## 2019-06-03 DIAGNOSIS — F4321 Adjustment disorder with depressed mood: Secondary | ICD-10-CM

## 2019-06-03 DIAGNOSIS — F4381 Prolonged grief disorder: Secondary | ICD-10-CM

## 2019-06-04 ENCOUNTER — Other Ambulatory Visit: Payer: Self-pay

## 2019-06-04 ENCOUNTER — Other Ambulatory Visit (HOSPITAL_COMMUNITY): Payer: BC Managed Care – PPO | Admitting: Licensed Clinical Social Worker

## 2019-06-04 DIAGNOSIS — F4321 Adjustment disorder with depressed mood: Secondary | ICD-10-CM

## 2019-06-04 DIAGNOSIS — F4381 Prolonged grief disorder: Secondary | ICD-10-CM

## 2019-06-04 DIAGNOSIS — F1020 Alcohol dependence, uncomplicated: Secondary | ICD-10-CM

## 2019-06-04 DIAGNOSIS — F331 Major depressive disorder, recurrent, moderate: Secondary | ICD-10-CM

## 2019-06-05 NOTE — Progress Notes (Signed)
    Daily Group Progress Note  Program: CD-IOP   Group Time: 1pm-2:30pm Participation Level: Active Behavioral Response: Appropriate Type of Therapy: Process Group Topic: Clinician met with group members, assessing for SI/HI/psychosis and overall level of functioning, including cravings or relapse. Clinician and group members processed 'highs and lows' from the previous day and any struggles in maintaining sobriety. Clinician and group members identified known and unknown triggers for relapse and family response to relapse. Clinician and group members read AA Daily Reflection focused on spirituality in recovery and the focus on 'keep coming back.' Clinician and group members read and processed NA daily meditation with a focus on "Feeling Good Isn't the Point" and sitting in uncomfortable feelings is a strength built during recovery rather than avoidance.   Group Time: 2:30pm-4pm Participation Level: Active Behavioral Response: Appropriate Type of Therapy: Psycho-education Group Topic: Psycho-education and skills group focus continuing on resentment and forgiveness. Clinician and group members completed worksheet identifying emotions linked with resentment, thoughts about self and others developed based on events supporting resentment. Clinician facilitated Empty Chair activity walking through what each group member is getting out of holding onto resentment and the changes in thought patterns resulting from forgiveness. Clinician and clients discussed acceptance vs forgiveness for situations out of control.    Summary: Client shared relapse with new sobriety date of 06/01/19 stating she is still working the program and attending meetings and therapy despite relapses. Client states younger son in the home is aware of her relapse and voiced disappointment however did not tell her older son due to response. Client reports resentment toward older son's behavior toward her and only responding when  convenient for himself. Client states forgiveness is not the most effective emotion in the moment, rather acceptance that she and son are in different places with desire to repair relationship.   Family Program: Family present? No   Name of family member(s): NA reports oldest son is not likely to participate in family therapy at this time  UDS collected: No Results: UDS from 06/01/19 positive for alcohol  AA/NA attended?: No  Sponsor?: Yes   Olegario Messier, LCSW

## 2019-06-05 NOTE — Progress Notes (Signed)
    Daily Group Progress Note  Program: CDIOP   Group Time: 1pm-2:30pm Participation Level: Active Behavioral Response: Appropriate, Sharing and Motivated Type of Therapy: Process Group Topic: Clinician checked in with group members, assessing for SI/HI/psychosis and overall level of functioning including difficulties with cravings or relapse. Clinician and group members processed 'highs and lows' since last group. Clinician and group members processed finding moments of joy in sobriety and accepting deserving moments of happiness in recovery. Clinician and group members read AA Daily Reflection 'Filling the Void' and discussed the need for distraction skills in early recovery to help cope with overwhelming emotions in the moment.   Group Time: 2:30pm-4pm Participation Level: Active Behavioral Response: Appropriate Type of Therapy: Psycho-education Group  Topic: Clinician presented the topic of Resentment and forgiveness. Group discussed resentment being one of the top reasons for relapse in early recovery. Clinician and group members started activity focused on identifying a resentment and related thoughts and feelings. Clinician inquired about small self-care activity for the evening.    Summary: Client checked in with reported sobriety date of 05/13/19. Client shared about finding moments of joy in both addiction and recovery and the benefit of fellowship of AA in recovery. Client shared update in looking for jobs and her oldest son's response to her in several conversations in which she is passive to avoid arguments. Client reports a resentment is related to how she is treated in conversations by her oldest son which she finds disrespectful.   Family Program: Family present? No   Name of family member(s): NA  UDS collected: No Results: pending  AA/NA attended?: Yes  Sponsor?: Yes   Harlon Ditty, LCSW

## 2019-06-08 ENCOUNTER — Encounter (HOSPITAL_COMMUNITY): Payer: Self-pay | Admitting: Licensed Clinical Social Worker

## 2019-06-08 ENCOUNTER — Other Ambulatory Visit: Payer: Self-pay

## 2019-06-08 ENCOUNTER — Encounter (HOSPITAL_COMMUNITY): Payer: Self-pay | Admitting: Medical

## 2019-06-08 ENCOUNTER — Other Ambulatory Visit (INDEPENDENT_AMBULATORY_CARE_PROVIDER_SITE_OTHER): Payer: BC Managed Care – PPO | Admitting: Licensed Clinical Social Worker

## 2019-06-08 DIAGNOSIS — K7010 Alcoholic hepatitis without ascites: Secondary | ICD-10-CM

## 2019-06-08 DIAGNOSIS — F1020 Alcohol dependence, uncomplicated: Secondary | ICD-10-CM | POA: Diagnosis not present

## 2019-06-08 DIAGNOSIS — F1994 Other psychoactive substance use, unspecified with psychoactive substance-induced mood disorder: Secondary | ICD-10-CM

## 2019-06-08 DIAGNOSIS — Z6372 Alcoholism and drug addiction in family: Secondary | ICD-10-CM

## 2019-06-08 DIAGNOSIS — F4381 Prolonged grief disorder: Secondary | ICD-10-CM

## 2019-06-08 DIAGNOSIS — F4321 Adjustment disorder with depressed mood: Secondary | ICD-10-CM

## 2019-06-08 DIAGNOSIS — Z811 Family history of alcohol abuse and dependence: Secondary | ICD-10-CM

## 2019-06-08 DIAGNOSIS — F410 Panic disorder [episodic paroxysmal anxiety] without agoraphobia: Secondary | ICD-10-CM

## 2019-06-08 DIAGNOSIS — F341 Dysthymic disorder: Secondary | ICD-10-CM

## 2019-06-08 NOTE — Progress Notes (Signed)
[name]                                                      DischargeSummary                                                                                                                                                                  Date of Admission: 04/06/2019 [place] Provider:Ann Amalia Hailey LCAS Date of Discharge: 06/08/2019 Sobriety Date: Admission Diagnosis: 1. Alcohol use disorder, severe, dependence ([place])  F10.20   2. 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 Family hx of alcoholism  Z81.1    Paternal Grandfather  4. Dysfunctional family due to alcoholism  Z63.72    Especially relationship with elder son  62. 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 Primary insomnia  F51.01   7. Hypertension, unspecified type  I10    ? alcohol related  8. Severe anxiety with panic  F41.0    12 years ago   9. Allergic contact dermatitis, unspecified trigger  L23.9   10. Alcoholic hepatitis without ascites  K70.10     Course of Treatment: On 04/06/2019 Pt presented for readmission to CD IOP after unsuccessful residential treatment at [facility]. She reached out to [facility] who referred her to [facility] where she presented with a BAC of 432 for Alcohol detox 03/29/19. She was discharged December 3 and met Counselor Amalia Hailey 04/03/19 for CAC pending admission today. She also was receiving medication and Counseling from [facility].  This is the 3rd relapse for her. The first came a few months after her first treatment at [facility] in 2015.She relates she went to treatment under pressure from her husband and family and did not appreciate she truly needed to be abstinent.  Her husband had been diagnosed with Stage IV  Non Small Cell CA lung cancer and was concerned she would not be able to care for their teenage sons and sought further treatment for his wife. She entered the CD IOP for the first time  but relapsed again in 2017  Patient experienced return to use during treatment and received additional relapse prevention work BREAKING THE ADDICTION CYCLE ([place]) AND CBT WORKSHEET for recurring behaviors without cognition completed 1 week before discharge. She was also to purchase [place] -Raised in an Alcoholic/Dysfunctional Family and CHEMICALLY DEPENDENT  She had returned to practicing abstinence and using IOP and AA Group as well as Publishing copy.  Medications: albuterol 108 (90 Base) MCG/ACT inhaler  Commonly known as: VENTOLIN HFA Inhale into the lungs.  ARIPiprazole 5 MG tablet Commonly known as: ABILIFY Take 1 tablet (5 mg total) by mouth daily.  baclofen 10 MG tablet Commonly known as: LIORESAL Take 1 tablet (10 mg total) by mouth 3 (three) times daily.  cloNIDine 0.1 MG tablet Commonly known as: CATAPRES Take 1 tablet (0.1 mg total) by mouth 2 (two) times daily as needed.  doxepin 10 MG capsule Commonly known as: SINEQUAN TAKE 1 TO 2 TABLETS BY MOUTH AT BEDTIME AS NEEDED  Melatonin 10 MG Tabs Take by mouth.  multivitamin with minerals tablet Take 1 tablet by mouth daily.  naproxen sodium 220 MG tablet Commonly known as: ALEVE Take 220 mg by mouth daily as needed.  sertraline 100 MG tablet Commonly known as: ZOLOFT Take 2 tablets (200 mg total) by mouth daily.  traZODone 100 MG tablet Commonly known as: DESYREL TAKE 2 TABLETS BY MOUTH AT BEDTIME    Discharge Diagnosis: 0 Alcohol use disorder, severe, dependence ([place]) 0 Family hx of alcoholism 0 Dysfunctional family due to alcoholism 0 Primary dysthymia early onset 0 Severe anxiety with panic 0 Grief reaction with prolonged bereavement 0 Substance induced mood disorder ([place]) 0 Alcoholic hepatitis without ascites  Plan of Action to Address Continuing Problems:  Goals and Activities to Help Maintain Sobriety: 1. Stay away from people ,places and things that are triggers 2. Continue  practicing Fair Fighting rules in interpersonal conflicts. 3. Continue alcohol and drug refusal skills and call on support system  4. Attend AA meetings AT LEAST as often as you use  5. Continue with a sponsor and a home group in [place] 6. Return to [facility]  Referrals: None  Next appointment:[facility]   Prognosis:Guarded    Client has participated in the development of this discharge plan and has received a copy of this completed plan  Patient ID: [name], female   DOB: 1965-11-07, 54 y.o.   MRN: [id number]

## 2019-06-09 ENCOUNTER — Other Ambulatory Visit: Payer: Self-pay

## 2019-06-09 ENCOUNTER — Ambulatory Visit (INDEPENDENT_AMBULATORY_CARE_PROVIDER_SITE_OTHER): Payer: BC Managed Care – PPO | Admitting: Psychiatry

## 2019-06-09 DIAGNOSIS — F4321 Adjustment disorder with depressed mood: Secondary | ICD-10-CM

## 2019-06-09 DIAGNOSIS — F4381 Prolonged grief disorder: Secondary | ICD-10-CM

## 2019-06-09 NOTE — Progress Notes (Signed)
    Daily Group Progress Note  Program: CD-IOP   Group Time: 1pm-2:30pm  Participation Level: Active  Behavioral Response: Appropriate  Type of Therapy: Process Group  Topic: Clinician met with group assessing SI/HI/psychosis and overall level of functioning including cravings or relapse. Clinician inquired about sobriety dates, community meetings attended and what supported and challenged recovery over the weekend. Clinician processed with clients recent experiences with different family members and friends' responses to relapse, both supportive and unsupportive and related thoughts and feelings. Clinician and group members processed changing in relationship dynamics between active use and early recovery.    Group Time: 2:30pm-4pm  Participation Level: Active  Behavioral Response: Appropriate  Type of Therapy: Psycho-education Group  Topic: Clinician presented the psycho-educational topic of Willingness vs Willfulness. Clinician and group members defined willingness and willfulness and recent moments of each related to addiction and sobriety. Clinician provided supplemental video on 'Willingness as an Antidote to Anxiety.' Clinician and group members discussed the practice of sitting in uncomfortable emotions or physical sensations and resisting the urge to act impulsively, specifically in relation to cravings in early recovery. Group members completed activity presented in video and clinician reviewed additional skill of Half-smiling and Willing hands   Clinician and group celebrated graduation of one member providing supportive feedback to support recovery.   Summary: Client completes program this day with reported sobriety date of 06/01/19. Client will continue with medication management and grief focused therapy at [facility]. Client shows some progress toward goals AEB some weeks of sobriety at the start of program and honesty about relapse and triggers after prompting in second  half of program. Client continued to engage with sponsor and [place] community. Some concerns still with splitting of children knowing about relapse to avoid consequences.    Family Program: Family present? No   Name of family member(s): NA  UDS collected: No   AA/NA attended?: Yes 4 meetings since last group therapy session Thursday 06/04/19.  Sponsor?: Yes   Olegario Messier, LCSW

## 2019-06-09 NOTE — Progress Notes (Signed)
[facility] Counselor/Therapist Progress Note  Patient ID: [name], MRN: [id number],    Date: 06/09/2019  Time Spent: 60 minutes  12:00noon to 1:00pm  Treatment Type: Individual Therapy  Reported Symptoms:  unresolved grief, anxiety, depression (some better)   Mental Status Exam:  Appearance:   Casual     Behavior:  Appropriate and Sharing  Motor:  Normal  Speech/Language:   Normal Rate  Affect:  anxious, some depression   Mood:  anxious and depressed  Thought process:  goal directed  Thought content:    WNL  Sensory/Perceptual disturbances:    WNL  Orientation:  oriented to person, place, time/date, situation, day of week, month of year and year  Attention:  Good  Concentration:  Good  Memory:  WNL  Fund of knowledge:   Good  Insight:    Good  Judgment:   Good  Impulse Control:  Good   Risk Assessment: Danger to Self:  No Self-injurious Behavior: No Danger to Others: No Duty to Warn:no Physical Aggression / Violence:No  Access to Firearms a concern: No  Gang Involvement:No   Subjective: Patient in today reporting feeling better, not as depressed, had good conversation recently with oldest son with whom there's been some friction. "He told me he loved me."  Had 2 "anxiety attacks" breathing hard, kept walking wherever I was, and not really sure what I was focused on or what the anxiety was about.  States she's not sure what caused it and it has not happened again.  Interventions: Cognitive Behavioral Therapy and Grief Therapy  Diagnosis:   ICD-10-CM   1. 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  Treatment Goals: Goals remain on tx plan as patient works on strategies to meet her goals. Progress will be noted each session in "Progress" section of Plan.  Long term goals: Reduce overall level, frequency, and intensity ofgrief andanxiety so that daily  functioning is not impaired.  Short term goals: Increase understanding of beliefs and messages that lead to worry and anxiety, and how these relate to her grief process.  Strategy: Explore cognitive messages that lead to anxiety responses and retrain in adaptive cognitions.(especially as these relate to her grief issues)  Progress: Patient reports still beating herself up for "not acting the right way during husband's cancer worsening and his eventual death.  Lots of guilt and sadness.  "Wish I had realized it before it was too late and he died."  Looks "down" today and shares that she didn't sleep well last night. Really struggling with not feeling very "deserving" yet of forgiveness. Reports that her AA materials reflect that Step 8 is more in line with forgiveness. She is currently between steps 6 and 7.  Has followed through on working with her AA steps, and on her desire to self-forgive in order to move forward.  Seems to be being more honest with herself and others rather than rushing into decisions.  Looking at what is holding her back and how to take additional steps forward.  Staying alcohol-free.  In contact with sponsor and healthy friends.  Wants to "be more comfortable with praying for herself" as that is important to her faith and she knows she needs her faith right now and into the future.  To practice that some along with some positive affirmations and AA material that she finds helpful.  Goal  review and progress noted with patient.   Next appt in 2 weeks.   Shanon Ace, LCSW

## 2019-06-10 ENCOUNTER — Encounter (HOSPITAL_COMMUNITY): Payer: BC Managed Care – PPO

## 2019-06-10 NOTE — Progress Notes (Incomplete)
    Daily Group Progress Note  Program: CD-IOP   Participation Level: Active Behavioral Response: Sharing Type of Therapy: Process Group Topic: Clinician met with group assessing SI/HI/psychosis and overall level of functioning, including strengths and struggles with recovery. Clinician and group members processed recent struggle with family dynamics and understanding of the recovery process. Clinician and group members read and processed Daily reflection.   Group Time: 2:30pm-4pm Participation Level: Active Behavioral Response: Appropriate Type of Therapy: Psycho-education Group Topic: Psycho-educational group co-facilitated by wellness director focused on self-care and recovery. Facilitator and group members discussed presented materials around sleep, diet, exercise. Group members discussed a change willing to make to improve an area of self-care (physical, psychological, emotional, spiritual, relationship, professional) and overall balance for supporting recovery.    Summary: ***   Family Program: Family present? No   Name of family member(s): ***  UDS collected: No Results: UDS from 05/25/19; negative for all substances  AA/NA attended?: {BHH YES OR NO:22294}{DAYS OF GQQP:61950}  Sponsor?: {BHH YES OR DT:26712}   Olegario Messier, LCSW

## 2019-06-11 ENCOUNTER — Encounter (HOSPITAL_COMMUNITY): Payer: BC Managed Care – PPO

## 2019-06-15 ENCOUNTER — Encounter (HOSPITAL_COMMUNITY): Payer: BC Managed Care – PPO

## 2019-06-17 ENCOUNTER — Encounter (HOSPITAL_COMMUNITY): Payer: BC Managed Care – PPO

## 2019-06-18 ENCOUNTER — Encounter (HOSPITAL_COMMUNITY): Payer: BC Managed Care – PPO

## 2019-06-19 ENCOUNTER — Other Ambulatory Visit: Payer: Self-pay | Admitting: Psychiatry

## 2019-06-19 DIAGNOSIS — F1021 Alcohol dependence, in remission: Secondary | ICD-10-CM

## 2019-06-19 DIAGNOSIS — F419 Anxiety disorder, unspecified: Secondary | ICD-10-CM

## 2019-06-22 ENCOUNTER — Encounter (HOSPITAL_COMMUNITY): Payer: BC Managed Care – PPO

## 2019-06-22 ENCOUNTER — Other Ambulatory Visit: Payer: Self-pay | Admitting: Psychiatry

## 2019-06-22 DIAGNOSIS — F3289 Other specified depressive episodes: Secondary | ICD-10-CM

## 2019-06-23 ENCOUNTER — Ambulatory Visit (INDEPENDENT_AMBULATORY_CARE_PROVIDER_SITE_OTHER): Payer: BC Managed Care – PPO | Admitting: Psychiatry

## 2019-06-23 ENCOUNTER — Other Ambulatory Visit: Payer: Self-pay

## 2019-06-23 DIAGNOSIS — F4321 Adjustment disorder with depressed mood: Secondary | ICD-10-CM | POA: Diagnosis not present

## 2019-06-23 DIAGNOSIS — F4381 Prolonged grief disorder: Secondary | ICD-10-CM

## 2019-06-23 NOTE — Progress Notes (Signed)
[facility] Counselor/Therapist Progress Note  Patient ID: [name], MRN: [id number],    Date: 06/23/2019  Time Spent: 60 minutes   12:00noon to 1:00pm  Treatment Type: Individual Therapy  Reported Symptoms: anxiety, "only had 1 panic attack in past 2 weeks", self-esteem and self-forgiveness issues  Mental Status Exam:  Appearance:   Casual     Behavior:  Appropriate, Sharing and Motivated  Motor:  Normal  Speech/Language:   Clear and Coherent  Affect:  Appropriate and congruent  Mood:  some anxiousness and sadness/grief  Thought process:  goal directed  Thought content:    WNL  Sensory/Perceptual disturbances:    WNL  Orientation:  oriented to person, place, time/date, situation, day of week, month of year and year  Attention:  Good  Concentration:  Good  Memory:  WNL  Fund of knowledge:   Good  Insight:    Good  Judgment:   Good  Impulse Control:  Good   Risk Assessment: Danger to Self:  No Self-injurious Behavior: No Danger to Others: No Duty to Warn:no Physical Aggression / Violence:No  Access to Firearms a concern: No  Gang Involvement:No   Subjective:  Patient in today reporting things "some better" as there has not been as many panic attacks and getting along better with her younger son, not much contact with older son.     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  Treatment Goals: Goals remain on tx plan as patient works on strategies to meet her goals. Progress will be noted each session in "Progress" section of Plan.  Long term goals: Reduce overall level, frequency, and intensity ofgrief andanxiety so that daily functioning is not impaired.  Short term goals: Increase understanding of beliefs and messages that lead to worry  and anxiety, and how these relate to her grief process.  Strategy: Explore cognitive messages that lead to anxiety responses and retrain in adaptive cognitions.(especially as these relate to her grief issues)  Progress: Patient in today and shares that she recently completed the IOP program at [facility] for drugs/alcohol.  Felt that she is ready to be discharged from that program. Still in contact with her AA sponsor 1-2 times a week.  Sober since 03-30-19. Developing some "tolerance" in relationships. Still struggling with some guilt and sadness which we processed more today.  Reflecting back on last session, patient notes that she has not been beating up herself as much this past couple weeks, and only 1 very mild panic attack. Seems more motivated today but also says "I need to be more motivated".  What she would do if more motivated.Marland KitchenMarland KitchenI'd got knock on doors trying to get a job, get more things done inside house, see friends more. Encouraged more action steps which was mentioned in a prior session. She stated her action steps for upcoming 2 weeks to be: To apply in person for 2 jobs, Pray more, for herself and others, and  Help my heart mend and follow up on therapy suggestions. To continue her positive affirmations and AA resources that have proven to be helpful to her. Forgiveness of herself is difficult for patient and we will pick up more on this next session.  Goal review and progress noted.    Next appt within 2-3 weeks.   Gavin Pound  Karene Fry, LCSW

## 2019-06-24 ENCOUNTER — Encounter (HOSPITAL_COMMUNITY): Payer: BC Managed Care – PPO

## 2019-06-25 ENCOUNTER — Other Ambulatory Visit: Payer: Self-pay | Admitting: Psychiatry

## 2019-06-25 ENCOUNTER — Encounter (HOSPITAL_COMMUNITY): Payer: BC Managed Care – PPO

## 2019-06-25 DIAGNOSIS — F5101 Primary insomnia: Secondary | ICD-10-CM

## 2019-06-26 ENCOUNTER — Ambulatory Visit (INDEPENDENT_AMBULATORY_CARE_PROVIDER_SITE_OTHER): Payer: BC Managed Care – PPO | Admitting: Psychiatry

## 2019-06-26 ENCOUNTER — Other Ambulatory Visit: Payer: Self-pay

## 2019-06-26 ENCOUNTER — Encounter: Payer: Self-pay | Admitting: Psychiatry

## 2019-06-26 ENCOUNTER — Other Ambulatory Visit: Payer: Self-pay | Admitting: Psychiatry

## 2019-06-26 DIAGNOSIS — F419 Anxiety disorder, unspecified: Secondary | ICD-10-CM

## 2019-06-26 DIAGNOSIS — F3289 Other specified depressive episodes: Secondary | ICD-10-CM | POA: Diagnosis not present

## 2019-06-26 DIAGNOSIS — F5101 Primary insomnia: Secondary | ICD-10-CM | POA: Diagnosis not present

## 2019-06-26 MED ORDER — DOXEPIN HCL 10 MG PO CAPS: ORAL_CAPSULE | ORAL | 1 refills | Status: DC

## 2019-06-26 MED ORDER — SERTRALINE HCL 100 MG PO TABS: 200.0000 mg | ORAL_TABLET | Freq: Every day | ORAL | 2 refills | Status: DC

## 2019-06-26 MED ORDER — REXULTI 3 MG PO TABS: 3.0000 mg | ORAL_TABLET | Freq: Every day | ORAL | 1 refills | Status: DC

## 2019-06-26 NOTE — Progress Notes (Signed)
[name] [id number] 06/08/1965 54 y.o.  Subjective:   Patient ID:  [name] is a 54 y.o. (DOB 1965-05-14) female.  Chief Complaint:  Chief Complaint  Patient presents with  . Depression  . Anxiety    HPI [name] presents to the office today for follow-up of depression, anxiety, and insomnia. She reports that she does not feel that Abilify has been helpful for depression. Reports depression on a daily basis. She has had some depression, anxiety, and panic attacks. She reports that she has been sleeping well and averaging about 8-9 hours of sleep a night. Energy and motivation have been low. She reports poor appetite. Reports about 1-2 panic attacks in the last month. She reports some fidgeting. Enjoying some things and decreased interest in some things. Denies concentration impairment. Denies SI.   She feels that Wellbutrin and Rexulti seemed to be more effective in the past.   Completed outpatient program at [facility]. She reports that she has been sober since 03/30/19 and denies cravings. She reports that her sons were home for Christmas. She reports that she and her sons are getting along well. Supportive family and friends. Working with a new sponsor and is working through her steps. Going to meetings daily Monday-Friday. Working through some grief.   Past medication trials: Lexapro-does not recall significant response Sertraline- Taken for 2.5 years. Mirtazapine Wellbutrin Rexulti Abilify Topamax Trileptal Gabapentin Seroquel Hydroxyzine-ineffective BuSpar Trazodone Doxepin- Reports this was helpful when she was in rehab Clonidine- Helpful for anxiety Baclofen- did not notice any change in cravings  PHQ2-9     Office Visit from 06/26/2019 in [facility] Office Visit from 02/06/2013 in [facility]  PHQ-2 Total Score  3  0  PHQ-9 Total Score  8  --       Review of Systems:  Review of Systems  Musculoskeletal: Negative  for gait problem.  Skin: Positive for rash.       Rash on hands  Allergic/Immunologic: Positive for environmental allergies.  Neurological: Negative for tremors.  Psychiatric/Behavioral:       Please refer to HPI    Medications: I have reviewed the patient's current medications.  Current Outpatient Medications  Medication Sig Dispense Refill  . cloNIDine (CATAPRES) 0.1 MG tablet TAKE 1 TABLET (0.1 MG TOTAL) BY MOUTH 2 (TWO) TIMES DAILY AS NEEDED. 60 tablet 0  . doxepin (SINEQUAN) 10 MG capsule TAKE 1-2 TABLETS BY MOUTH AT BEDTIME AS NEEDED 60 capsule 1  . Multiple Vitamins-Minerals (MULTIVITAMIN WITH MINERALS) tablet Take 1 tablet by mouth daily.    . naproxen sodium (ALEVE) 220 MG tablet Take 220 mg by mouth daily as needed.    Marland Kitchen albuterol (VENTOLIN HFA) 108 (90 Base) MCG/ACT inhaler Inhale into the lungs.    . baclofen (LIORESAL) 10 MG tablet Take 1 tablet (10 mg total) by mouth 3 (three) times daily. (Patient not taking: Reported on 06/26/2019) 90 tablet 1  . Brexpiprazole (REXULTI) 3 MG TABS Take 1 tablet (3 mg total) by mouth daily. 30 tablet 1  . sertraline (ZOLOFT) 100 MG tablet Take 2 tablets (200 mg total) by mouth daily. 60 tablet 2  . traZODone (DESYREL) 100 MG tablet TAKE 2 TABLETS BY MOUTH AT BEDTIME. 30 tablet 1   No current facility-administered medications for this visit.    Medication Side Effects: None  Allergies:  Allergies  Allergen Reactions  . Erythromycin     Stomach  cramps    Past Medical  History:  Diagnosis Date  . Alcohol dependence (HCC)   . Allergy   . Depression   . Headache   . HTN (hypertension)     Family History  Problem Relation Age of Onset  . Cancer Mother 28       breast  . Depression Mother   . Heart disease Mother   . Macular degeneration Mother   . Diabetes Maternal Grandmother   . Heart disease Maternal Grandmother   . Stroke Maternal Grandmother   . Alcohol abuse Sister   . Depression Sister   . Alcohol abuse Paternal  Grandmother   . Cancer Father   . COPD Father   . Depression Brother   . Early death Neg Hx     Social History   Socioeconomic History  . Marital status: Married    Spouse name: Not on file  . Number of children: Not on file  . Years of education: Not on file  . Highest education level: Not on file  Occupational History  . Not on file  Tobacco Use  . Smoking status: Never Smoker  . Smokeless tobacco: Never Used  Substance and Sexual Activity  . Alcohol use: Not Currently    Alcohol/week: 0.0 standard drinks  . Drug use: No  . Sexual activity: Yes    Birth control/protection: Surgical  Other Topics Concern  . Not on file  Social History Narrative  . Not on file   Social Determinants of Health   Financial Resource Strain:   . Difficulty of Paying Living Expenses: Not on file  Food Insecurity:   . Worried About Programme researcher, broadcasting/film/video in the Last Year: Not on file  . Ran Out of Food in the Last Year: Not on file  Transportation Needs:   . Lack of Transportation (Medical): Not on file  . Lack of Transportation (Non-Medical): Not on file  Physical Activity:   . Days of Exercise per Week: Not on file  . Minutes of Exercise per Session: Not on file  Stress:   . Feeling of Stress : Not on file  Social Connections:   . Frequency of Communication with Friends and Family: Not on file  . Frequency of Social Gatherings with Friends and Family: Not on file  . Attends Religious Services: Not on file  . Active Member of Clubs or Organizations: Not on file  . Attends Banker Meetings: Not on file  . Marital Status: Not on file  Intimate Partner Violence:   . Fear of Current or Ex-Partner: Not on file  . Emotionally Abused: Not on file  . Physically Abused: Not on file  . Sexually Abused: Not on file    Past Medical History, Surgical history, Social history, and Family history were reviewed and updated as appropriate.   Please see review of systems for further  details on the patient's review from today.   Objective:   Physical Exam:  BP 117/75   Pulse 95   Physical Exam Constitutional:      General: She is not in acute distress. Musculoskeletal:        General: No deformity.  Neurological:     Mental Status: She is alert and oriented to person, place, and time.     Coordination: Coordination normal.  Psychiatric:        Attention and Perception: Attention and perception normal. She does not perceive auditory or visual hallucinations.        Mood and Affect: Mood is  depressed. Mood is not anxious. Affect is not labile, blunt, angry or inappropriate.        Speech: Speech normal.        Behavior: Behavior normal.        Thought Content: Thought content normal. Thought content is not paranoid or delusional. Thought content does not include homicidal or suicidal ideation. Thought content does not include homicidal or suicidal plan.        Cognition and Memory: Cognition and memory normal.        Judgment: Judgment normal.     Comments: Insight intact     Lab Review:     Component Value Date/Time   NA 138 11/24/2015 2200   K 3.8 11/24/2015 2200   CL 106 11/24/2015 2200   CO2 21 (L) 11/24/2015 2200   GLUCOSE 90 11/24/2015 2200   BUN 14 11/24/2015 2200   CREATININE 0.71 11/24/2015 2200   CALCIUM 9.3 11/24/2015 2200   PROT 7.5 11/24/2015 2200   ALBUMIN 4.0 11/24/2015 2200   AST 91 (H) 11/24/2015 2200   ALT 61 (H) 11/24/2015 2200   ALKPHOS 93 11/24/2015 2200   BILITOT 0.9 11/24/2015 2200   GFRNONAA >60 11/24/2015 2200   GFRAA >60 11/24/2015 2200       Component Value Date/Time   WBC 9.8 11/24/2015 2200   RBC 3.78 (L) 11/24/2015 2200   HGB 12.0 11/24/2015 2200   HCT 35.7 (L) 11/24/2015 2200   PLT 282 11/24/2015 2200   MCV 94.4 11/24/2015 2200   MCH 31.7 11/24/2015 2200   MCHC 33.6 11/24/2015 2200   RDW 12.8 11/24/2015 2200    No results found for: POCLITH, LITHIUM   No results found for: PHENYTOIN, PHENOBARB,  VALPROATE, CBMZ   .res Assessment: Plan:   Agreed with patient that depressive and anxiety signs and symptoms seem to have been better controlled with Rexulti in the past, compared to Abilify.  Will therefore stop Abilify and restart Rexulti 3 mg daily since patient was previously taking Rexulti 3 mg daily with good response.  Reviewed potential benefits, risks, and side effects of Rexulti. Will continue all other current medications.  Discussed considering restarting Wellbutrin in the future based on response of Rexulti since patient reports that Wellbutrin may have also been helpful for her depression. Patient to follow-up in 4 weeks or sooner if clinically indicated. Recommend continuing psychotherapy with Rinaldo Cloud, LCSW. Patient advised to contact office with any questions, adverse effects, or acute worsening in signs and symptoms.  Aiva was seen today for depression and anxiety.  Diagnoses and all orders for this visit:  Primary insomnia -     doxepin (SINEQUAN) 10 MG capsule; TAKE 1-2 TABLETS BY MOUTH AT BEDTIME AS NEEDED  Other depression -     Brexpiprazole (REXULTI) 3 MG TABS; Take 1 tablet (3 mg total) by mouth daily. -     sertraline (ZOLOFT) 100 MG tablet; Take 2 tablets (200 mg total) by mouth daily.  Anxiety -     Brexpiprazole (REXULTI) 3 MG TABS; Take 1 tablet (3 mg total) by mouth daily. -     sertraline (ZOLOFT) 100 MG tablet; Take 2 tablets (200 mg total) by mouth daily.     Please see After Visit Summary for patient specific instructions.  Future Appointments  Date Time Provider [place]  07/07/2019 12:00 PM Shanon Ace, LCSW [facility] None  07/21/2019 12:00 PM Shanon Ace, LCSW [facility] None  08/04/2019 12:00 PM Shanon Ace, LCSW [facility] None  08/18/2019  3:00 PM Mathis Fare, LCSW [facility] None    No orders of the defined types were placed in this encounter.   -------------------------------

## 2019-06-26 NOTE — Telephone Encounter (Signed)
Apt today at 2

## 2019-06-29 ENCOUNTER — Encounter (HOSPITAL_COMMUNITY): Payer: BC Managed Care – PPO

## 2019-07-01 ENCOUNTER — Encounter (HOSPITAL_COMMUNITY): Payer: BC Managed Care – PPO

## 2019-07-02 ENCOUNTER — Encounter (HOSPITAL_COMMUNITY): Payer: BC Managed Care – PPO

## 2019-07-06 ENCOUNTER — Encounter (HOSPITAL_COMMUNITY): Payer: BC Managed Care – PPO

## 2019-07-07 ENCOUNTER — Ambulatory Visit (INDEPENDENT_AMBULATORY_CARE_PROVIDER_SITE_OTHER): Payer: BC Managed Care – PPO | Admitting: Psychiatry

## 2019-07-07 ENCOUNTER — Other Ambulatory Visit: Payer: Self-pay

## 2019-07-07 DIAGNOSIS — F4321 Adjustment disorder with depressed mood: Secondary | ICD-10-CM

## 2019-07-07 DIAGNOSIS — F4381 Prolonged grief disorder: Secondary | ICD-10-CM

## 2019-07-07 NOTE — Progress Notes (Signed)
[facility] Counselor/Therapist Progress Note  Patient ID: [name], MRN: [id number],    Date: 07/07/2019  Time Spent: 60 minutes   12:00noon to 1:00pm   Treatment Type: Individual Therapy  Reported Symptoms: grief (unresolved), anxiety  Mental Status Exam:  Appearance:   Casual     Behavior:  Appropriate, Sharing and Motivated  Motor:  Normal  Speech/Language:   Normal Rate  Affect:  some anxiety   Mood:  anxious and some sadness  Thought process:  normal  Thought content:    WNL  Sensory/Perceptual disturbances:    WNL  Orientation:  oriented to person, place, time/date, situation, day of week, month of year and year  Attention:  Good  Concentration:  Good  Memory:  WNL  Fund of knowledge:   Good  Insight:    Good  Judgment:   Good  Impulse Control:  Good   Risk Assessment: Danger to Self:  No Self-injurious Behavior: No Danger to Others: No Duty to Warn:no Physical Aggression / Violence:No  Access to Firearms a concern: No  Gang Involvement:No   Subjective:  Patient reporting today she's had no panic attacks since last appt.  Some increased sadness due to longtime former neighbor who died suddenly. Motivated today to work on herself moving forward and with more forgiveness.    Interventions: Solution-Oriented/Positive Psychology and Ego-Supportive  Diagnosis:   ICD-10-CM   1. 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  Treatment Goals: Goals remain on tx plan as patient works on strategies to meet her goals. Progress will be noted each session in "Progress" section of Plan.  Long term goals: Reduce overall level, frequency, and intensity ofgrief andanxiety so that daily functioning is not impaired.  Short term goals: Increase understanding of beliefs and messages that lead to worry and anxiety, and how these relate to her grief  process.  Strategy: Explore cognitive messages that lead to anxiety responses and retrain in adaptive cognitions.(especially as these relate to her grief issues)  Progress: Patient today reports some less anxiety, no panic attacks since last session, feeling more motivated and productive.  Feels the Rexulti medication is helping her. "Things are good with my younger son, but older son is still not speaking with her." She had reached out to him via text several times in one day but didn't hear back so has held off texting anymore. Hurtful for patient but starting to better accept that she can't control son's lack of calling her.  Continues to work on her own grief and "it comes and goes.  Concerned about "special dates" coming up within next 2 months (husband's bday, husband's death, and their wedding anniversary). Trying not to overly focus on these dates.  [name] former long-time next door neighbor died suddenly this past weekend and that has added to her sadness.  "One of my biggest problems is I'm going through menopause and am often very hot during the day. Shared her frustrations with this and added that she plans to schedule appt with her doctor soon. A positive for her is that she has a job interview this week for a job in [employer], [employer] as a Museum/gallery curator. Reviewed progress and what helps her in staying sober.  Continues to faithfully attend multiple AA meetings. (sober since 03-30-19.)  Talked about forgiveness/self-forgiveness and how that can help support her move forward. Homework assignment  for this particular subject given and patient motivated. (forgiveness of self and others, for what specifically?)  Goal review and progress noted with patient.    Next appt within 2 weeks.   Mathis Fare, LCSW

## 2019-07-08 ENCOUNTER — Encounter (HOSPITAL_COMMUNITY): Payer: BC Managed Care – PPO

## 2019-07-09 ENCOUNTER — Encounter (HOSPITAL_COMMUNITY): Payer: BC Managed Care – PPO

## 2019-07-12 ENCOUNTER — Other Ambulatory Visit: Payer: Self-pay | Admitting: Psychiatry

## 2019-07-12 DIAGNOSIS — F419 Anxiety disorder, unspecified: Secondary | ICD-10-CM

## 2019-07-12 DIAGNOSIS — F1021 Alcohol dependence, in remission: Secondary | ICD-10-CM

## 2019-07-14 ENCOUNTER — Other Ambulatory Visit: Payer: Self-pay | Admitting: Psychiatry

## 2019-07-14 DIAGNOSIS — F3289 Other specified depressive episodes: Secondary | ICD-10-CM

## 2019-07-21 ENCOUNTER — Other Ambulatory Visit: Payer: Self-pay

## 2019-07-21 ENCOUNTER — Ambulatory Visit (INDEPENDENT_AMBULATORY_CARE_PROVIDER_SITE_OTHER): Payer: BC Managed Care – PPO | Admitting: Psychiatry

## 2019-07-21 DIAGNOSIS — F4329 Adjustment disorder with other symptoms: Secondary | ICD-10-CM

## 2019-07-21 DIAGNOSIS — F4381 Prolonged grief disorder: Secondary | ICD-10-CM

## 2019-07-21 DIAGNOSIS — F4321 Adjustment disorder with depressed mood: Secondary | ICD-10-CM

## 2019-07-21 NOTE — Progress Notes (Signed)
[facility] Counselor/Therapist Progress Note  Patient ID: [name], MRN: [id number],    Date: 07/21/2019  Time Spent: 60 minutes   12:00noon to 1:00pm  Treatment Type: Individual Therapy  Reported Symptoms:  Some anxiety "but more sadness and some depression"; yesterday was deceased husband's birthday and that was difficult at times.  Older son visited last weekend and that went better than expected.    Mental Status Exam:  Appearance:   n/a  telehealth     Behavior:  Appropriate, Sharing and Motivated  Motor:  Normal  Speech/Language:   Normal Rate  Affect:  anxious, some depression  Mood:  normal  Thought process:  normal  Thought content:    WNL  Sensory/Perceptual disturbances:    WNL  Orientation:  oriented to person, place, time/date, situation, day of week, month of year and year  Attention:  Good  Concentration:  Good  Memory:  WNL  Fund of knowledge:   Good  Insight:    Good  Judgment:   Good  Impulse Control:  Good   Risk Assessment: Danger to Self:  No Self-injurious Behavior: No Danger to Others: No Duty to Warn:no Physical Aggression / Violence:No  Access to Firearms a concern: No  Gang Involvement:Yes   Subjective:  Patient in today reporting depression, some anxiousness, regrets, sadness re: grief over loss of husband.  Went to some good [employer] and especially a meeting she went to last night. Felt supported and challenged.  Interventions: Cognitive Behavioral Therapy and Solution-Oriented/Positive Psychology, Grief Therapy  Diagnosis:   ICD-10-CM   1. 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  Treatment Goals: Goals remain on tx plan as patient works on strategies to meet her goals. Progress will be noted each session in "Progress" section of Plan.  Long term goals: Reduce overall level, frequency, and intensity ofgrief andanxiety so  that daily functioning is not impaired.  Short term goals: Increase understanding of beliefs and messages that lead to worry and anxiety, and how these relate to her grief process.  Strategy: Explore cognitive messages that lead to anxiety responses and retrain in adaptive cognitions.(especially as these relate to her grief issues)  Progress: Patient in today working further on her grief, "wants to not be as sad and be able to move on."  Had not completed homework on forgiveness but will complete in before next session. Remaining sober. Wants to be happy and exicited about things, and wanting to "move on with my life now." Wanting to find a job and is applying. Wanting to stop "getting in my own way." Discussed what helps and what does not help, as well as her need to start looking more for what might go right versus wrong, looking at what she can control and what she can't, and staying more in the present than in the past or too far in the future (as she tends to worry about what may happen).  With her long term goal, she is making progress with the frequency, level, and intensity of her grief and anxiety lessening.  Good efforts today in working on changing behaviors and motivating herself more with some action stes "to stop holding herself back and help her to move on."    Goal review and progress noted with patient.  Next appt within 2-3 weeks.   Mathis Fare, LCSW

## 2019-07-24 ENCOUNTER — Ambulatory Visit (INDEPENDENT_AMBULATORY_CARE_PROVIDER_SITE_OTHER): Payer: BC Managed Care – PPO | Admitting: Psychiatry

## 2019-07-24 ENCOUNTER — Other Ambulatory Visit: Payer: Self-pay

## 2019-07-24 ENCOUNTER — Encounter: Payer: Self-pay | Admitting: Psychiatry

## 2019-07-24 DIAGNOSIS — F3289 Other specified depressive episodes: Secondary | ICD-10-CM | POA: Diagnosis not present

## 2019-07-24 DIAGNOSIS — F419 Anxiety disorder, unspecified: Secondary | ICD-10-CM

## 2019-07-24 DIAGNOSIS — F5101 Primary insomnia: Secondary | ICD-10-CM | POA: Diagnosis not present

## 2019-07-24 DIAGNOSIS — F1021 Alcohol dependence, in remission: Secondary | ICD-10-CM

## 2019-07-24 MED ORDER — REXULTI 3 MG PO TABS: 3.0000 mg | ORAL_TABLET | Freq: Every day | ORAL | 1 refills | Status: DC

## 2019-07-24 MED ORDER — TRAZODONE HCL 100 MG PO TABS: 200.0000 mg | ORAL_TABLET | Freq: Every day | ORAL | 1 refills | Status: DC

## 2019-07-24 MED ORDER — CLONIDINE HCL 0.1 MG PO TABS: 0.1000 mg | ORAL_TABLET | Freq: Every day | ORAL | 2 refills | Status: DC

## 2019-07-24 MED ORDER — SERTRALINE HCL 100 MG PO TABS: 200.0000 mg | ORAL_TABLET | Freq: Every day | ORAL | 2 refills | Status: DC

## 2019-07-24 MED ORDER — DOXEPIN HCL 10 MG PO CAPS: ORAL_CAPSULE | ORAL | 1 refills | Status: DC

## 2019-07-24 NOTE — Progress Notes (Signed)
   07/24/19 1354  Facial and Oral Movements  Muscles of Facial Expression 0  Lips and Perioral Area 0  Jaw 0  Tongue 0  Extremity Movements  Upper (arms, wrists, hands, fingers) 0  Lower (legs, knees, ankles, toes) 0  Trunk Movements  Neck, shoulders, hips 0  Overall Severity  Severity of abnormal movements (highest score from questions above) 0  Incapacitation due to abnormal movements 0  [name] awareness of abnormal movements (rate only patient's report) 0  AIMS Total Score  AIMS Total Score 0

## 2019-07-24 NOTE — Progress Notes (Signed)
[name] [id number] [birth year] 54 y.o.  Subjective:   Patient ID:  [name] is a 54 y.o. (DOB 1965/05/28) female.  Chief Complaint:  Chief Complaint  Patient presents with  . Follow-up    h/o Depression, anxiety, and ETOH misuse    HPI [name] presents to the office today for follow-up of depression, anxiety, and insomnia. She reports that Rexulti seems to be helping with overall mood. Husband's birthday was Monday. She reports that she went to a meeting on Monday night and was able to talk about it. She reports that she is frequently dreaming about her deceased husband. She reports that she feels that she is accepting the loss of her husband more. She reports improved mood with "some ups and downs." She that motivation remains lower. Energy is fair. She notices she is putting some things off until the last minute, such as going to the grocery store. Anxiety has been "fine" and denies any recent panic attacks. Denies difficulty falling or staying asleep. Reports that appetite has been less since change from Abilify to Rexulti. Concentration is adequate. Enjoying being around others. Has been planning at least one thing everyday. Has been applying to different jobs. Denies SI.   Going to go see her sister in [place] next week. Reports that sister has not been drinking and has been doing well. Son just turned 14. Oldest son came home for visit recently and they had a good visit.   Denies any cravings to drink.   Taking Clonidine at HS only.  Past medication trials: Lexapro-does not recall significant response Sertraline- Taken for 2.5 years. Mirtazapine Wellbutrin Rexulti Abilify Topamax Trileptal Gabapentin Seroquel Hydroxyzine-ineffective BuSpar Trazodone Doxepin- Reports this was helpful when she was in rehab Clonidine- Helpful for anxiety Baclofen- did not notice any change in cravings   AIMS     Office Visit from 07/24/2019 in [facility]   AIMS Total Score  0    PHQ2-9     Office Visit from 06/26/2019 in [facility] Office Visit from 02/06/2013 in [facility]  PHQ-2 Total Score  3  0  PHQ-9 Total Score  8  --       Review of Systems:  Review of Systems  Musculoskeletal: Negative for gait problem.  Neurological: Negative for tremors.       Occ dizziness upon standing   Psychiatric/Behavioral:       Please refer to HPI    Medications: I have reviewed the patient's current medications.  Current Outpatient Medications  Medication Sig Dispense Refill  . albuterol (VENTOLIN HFA) 108 (90 Base) MCG/ACT inhaler Inhale into the lungs.    . Brexpiprazole (REXULTI) 3 MG TABS Take 1 tablet (3 mg total) by mouth daily. 30 tablet 1  . cloNIDine (CATAPRES) 0.1 MG tablet Take 1 tablet (0.1 mg total) by mouth at bedtime. 30 tablet 2  . doxepin (SINEQUAN) 10 MG capsule TAKE 1-2 TABLETS BY MOUTH AT BEDTIME AS NEEDED 60 capsule 1  . Multiple Vitamins-Minerals (MULTIVITAMIN WITH MINERALS) tablet Take 1 tablet by mouth daily.    . naproxen sodium (ALEVE) 220 MG tablet Take 220 mg by mouth daily as needed.    . sertraline (ZOLOFT) 100 MG tablet Take 2 tablets (200 mg total) by mouth daily. 60 tablet 2  . traZODone (DESYREL) 100 MG tablet Take 2 tablets (200 mg total) by mouth at bedtime. 30 tablet 1   No current facility-administered medications for this visit.  Medication Side Effects: None  Allergies:  Allergies  Allergen Reactions  . Erythromycin     Stomach  cramps    Past Medical History:  Diagnosis Date  . Alcohol dependence ([place])   . Allergy   . Depression   . Headache   . HTN (hypertension)     Family History  Problem Relation Age of Onset  . Cancer Mother 68       breast  . Depression Mother   . Heart disease Mother   . Macular degeneration Mother   . Diabetes Maternal Grandmother   . Heart disease Maternal Grandmother   . Stroke Maternal Grandmother   .  Alcohol abuse Sister   . Depression Sister   . Alcohol abuse Paternal Grandmother   . Cancer Father   . COPD Father   . Depression Brother   . Early death Neg Hx     Social History   Socioeconomic History  . Marital status: Married    Spouse name: Not on file  . Number of children: Not on file  . Years of education: Not on file  . Highest education level: Not on file  Occupational History  . Not on file  Tobacco Use  . Smoking status: Never Smoker  . Smokeless tobacco: Never Used  Substance and Sexual Activity  . Alcohol use: Not Currently    Alcohol/week: 0.0 standard drinks  . Drug use: No  . Sexual activity: Yes    Birth control/protection: Surgical  Other Topics Concern  . Not on file  Social History Narrative  . Not on file   Social Determinants of Health   Financial Resource Strain:   . Difficulty of Paying Living Expenses:   Food Insecurity:   . Worried About Charity fundraiser in the Last Year:   . Arboriculturist in the Last Year:   Transportation Needs:   . Film/video editor (Medical):   Marland Kitchen Lack of Transportation (Non-Medical):   Physical Activity:   . Days of Exercise per Week:   . Minutes of Exercise per Session:   Stress:   . Feeling of Stress :   Social Connections:   . Frequency of Communication with Friends and Family:   . Frequency of Social Gatherings with Friends and Family:   . Attends Religious Services:   . Active Member of Clubs or Organizations:   . Attends Archivist Meetings:   Marland Kitchen Marital Status:   Intimate Partner Violence:   . Fear of Current or Ex-Partner:   . Emotionally Abused:   Marland Kitchen Physically Abused:   . Sexually Abused:     Past Medical History, Surgical history, Social history, and Family history were reviewed and updated as appropriate.   Please see review of systems for further details on the patient's review from today.   Objective:   Physical Exam:  BP 97/60   Pulse 85   Physical  Exam Constitutional:      General: She is not in acute distress. Musculoskeletal:        General: No deformity.  Neurological:     Mental Status: She is alert and oriented to person, place, and time.     Coordination: Coordination normal.  Psychiatric:        Attention and Perception: Attention and perception normal. She does not perceive auditory or visual hallucinations.        Mood and Affect: Mood normal. Mood is not anxious or depressed. Affect is not labile,  blunt, angry or inappropriate.        Speech: Speech normal.        Behavior: Behavior normal.        Thought Content: Thought content normal. Thought content is not paranoid or delusional. Thought content does not include homicidal or suicidal ideation. Thought content does not include homicidal or suicidal plan.        Cognition and Memory: Cognition and memory normal.        Judgment: Judgment normal.     Comments: Insight intact     Lab Review:     Component Value Date/Time   NA 138 11/24/2015 2200   K 3.8 11/24/2015 2200   CL 106 11/24/2015 2200   CO2 21 (L) 11/24/2015 2200   GLUCOSE 90 11/24/2015 2200   BUN 14 11/24/2015 2200   CREATININE 0.71 11/24/2015 2200   CALCIUM 9.3 11/24/2015 2200   PROT 7.5 11/24/2015 2200   ALBUMIN 4.0 11/24/2015 2200   AST 91 (H) 11/24/2015 2200   ALT 61 (H) 11/24/2015 2200   ALKPHOS 93 11/24/2015 2200   BILITOT 0.9 11/24/2015 2200   GFRNONAA >60 11/24/2015 2200   GFRAA >60 11/24/2015 2200       Component Value Date/Time   WBC 9.8 11/24/2015 2200   RBC 3.78 (L) 11/24/2015 2200   HGB 12.0 11/24/2015 2200   HCT 35.7 (L) 11/24/2015 2200   PLT 282 11/24/2015 2200   MCV 94.4 11/24/2015 2200   MCH 31.7 11/24/2015 2200   MCHC 33.6 11/24/2015 2200   RDW 12.8 11/24/2015 2200    No results found for: POCLITH, LITHIUM   No results found for: PHENYTOIN, PHENOBARB, VALPROATE, CBMZ   .res Assessment: Plan:   Will continue Rexulti 3 mg daily since patient reports improved mood  and anxiety since restarting Rexulti. Continue sertraline 200 mg daily for depression and anxiety. Continue clonidine 0.1 mg at bedtime for anxiety and insomnia. Continue trazodone 200 mg at bedtime for insomnia. Continue doxepin 10 mg 1 to 2 tablets for insomnia. Recommend continuing psychotherapy with Rockne Menghini, LCSW. Patient to follow-up with this provider in 2 months or sooner if clinically indicated. Patient advised to contact office with any questions, adverse effects, or acute worsening in signs and symptoms.   Trameka was seen today for follow-up.  Diagnoses and all orders for this visit:  Other depression -     Brexpiprazole (REXULTI) 3 MG TABS; Take 1 tablet (3 mg total) by mouth daily. -     sertraline (ZOLOFT) 100 MG tablet; Take 2 tablets (200 mg total) by mouth daily.  Anxiety -     Brexpiprazole (REXULTI) 3 MG TABS; Take 1 tablet (3 mg total) by mouth daily. -     sertraline (ZOLOFT) 100 MG tablet; Take 2 tablets (200 mg total) by mouth daily. -     cloNIDine (CATAPRES) 0.1 MG tablet; Take 1 tablet (0.1 mg total) by mouth at bedtime.  Alcohol dependence in remission (HCC) -     cloNIDine (CATAPRES) 0.1 MG tablet; Take 1 tablet (0.1 mg total) by mouth at bedtime.  Primary insomnia -     doxepin (SINEQUAN) 10 MG capsule; TAKE 1-2 TABLETS BY MOUTH AT BEDTIME AS NEEDED -     traZODone (DESYREL) 100 MG tablet; Take 2 tablets (200 mg total) by mouth at bedtime.     Please see After Visit Summary for patient specific instructions.  Future Appointments  Date Time Provider Department Center  08/04/2019 12:00 PM Mathis Fare,  LCSW [facility] None  08/18/2019  3:00 PM Mathis Fare, LCSW [facility] None  09/01/2019 12:00 PM Mathis Fare, LCSW [facility] None  09/15/2019 12:00 PM Mathis Fare, LCSW [facility] None  09/23/2019  1:30 PM Corie Chiquito, PMHNP [facility] None    No orders of the defined types were placed in this encounter.   -------------------------------

## 2019-08-04 ENCOUNTER — Ambulatory Visit (INDEPENDENT_AMBULATORY_CARE_PROVIDER_SITE_OTHER): Payer: BC Managed Care – PPO | Admitting: Psychiatry

## 2019-08-04 ENCOUNTER — Other Ambulatory Visit: Payer: Self-pay

## 2019-08-04 DIAGNOSIS — F4381 Prolonged grief disorder: Secondary | ICD-10-CM

## 2019-08-04 DIAGNOSIS — F4321 Adjustment disorder with depressed mood: Secondary | ICD-10-CM

## 2019-08-04 NOTE — Progress Notes (Signed)
[facility] Counselor/Therapist Progress Note  Patient ID: [name], MRN: [id number],    Date: 08/04/2019  Time Spent: 60 minutes  12:00noon to 1:00pm  Treatment Type: Individual Therapy  Reported Symptoms: grief, anxiety, depression (but it has improved)  Mental Status Exam:  Appearance:   Casual     Behavior:  Appropriate and Sharing  Motor:  Normal  Speech/Language:   Normal Rate  Affect:  anxiety  Mood:  anxious  Thought process:  goal directed  Thought content:    WNL  Sensory/Perceptual disturbances:    WNL  Orientation:  oriented to person, place, time/date, situation, day of week, month of year and year  Attention:  Good  Concentration:  Good  Memory:  WNL  Fund of knowledge:   Good  Insight:    Good  Judgment:   Good  Impulse Control:  Good   Risk Assessment: Danger to Self:  No Self-injurious Behavior: No Danger to Others: No Duty to Warn:no Physical Aggression / Violence:No  Access to Firearms a concern: No  Gang Involvement:No   Subjective:  Patient in today reporting anxiety and some depression,  but depression has improved. Had some random dreams of husband who is deceased.  "not nightmares but just random dream that he is in." Feels like she is working through her grief issues and sees how unexpectedly at times the grief can surface.   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  Treatment Goals: Goals remain on tx plan as patient works on strategies to meet her goals. Progress will be noted each session in "Progress" section of Plan.  Long term goals: Reduce overall level, frequency, and intensity ofgrief andanxiety so that daily functioning is not impaired.  Short term goals: Increase understanding of beliefs and messages that lead to worry and  anxiety, and how these relate to her grief process.  Strategy: Explore cognitive messages that lead to anxiety responses and retrain in adaptive cognitions.(especially as these relate to her grief issues)  Progress: Patient in today reporting anxiety and some grief issues, as grief is unexpectedly surfacing at times.  Recent trip to [place] to see her twin sister in [place]. Had a lot of anxiety just before flight home, "used some of my medicine and that helped me calm down, not sure what made me anxious, and it didn't happen again." Good trip for her. Grief issues continue but "I am getting better." Two Year anniversary of husband's death is 08/20/2019. I was at home and he had to go back into hospital and was worse, and died f few days later. Tearfully struggled with the guilt of her drinking while husband was so sick with cancer. Is staying alcohol free and very involved in AA daily.  Beginning to work on forgiveness and want to let go of her intense guilt.  On 1-10 scale of guilt she rates herself currently an "8". Patient processed a lot of her feelings of guilt, some resentment, some sadness and grief, lot of stress, and her mixed feelings throughout the times of her husband's illness, and eventual death. Homework assignment given and patient commits to working on it prior to next session (re: Guilt). Is following up on her efforts to stop "looking for what might go wrong versus right, staying in the  present, not worrying about things off in the future, and focusing on what she can control verus not control.     Goal review and progress noted with patient.  Next appt within 2 weeks.    Shanon Ace, LCSW

## 2019-08-18 ENCOUNTER — Ambulatory Visit: Payer: BC Managed Care – PPO | Admitting: Psychiatry

## 2019-08-20 ENCOUNTER — Other Ambulatory Visit: Payer: Self-pay | Admitting: Psychiatry

## 2019-08-20 DIAGNOSIS — F5101 Primary insomnia: Secondary | ICD-10-CM

## 2019-09-01 ENCOUNTER — Ambulatory Visit (INDEPENDENT_AMBULATORY_CARE_PROVIDER_SITE_OTHER): Payer: BC Managed Care – PPO | Admitting: Psychiatry

## 2019-09-01 ENCOUNTER — Other Ambulatory Visit: Payer: Self-pay

## 2019-09-01 DIAGNOSIS — F4321 Adjustment disorder with depressed mood: Secondary | ICD-10-CM | POA: Diagnosis not present

## 2019-09-01 DIAGNOSIS — F4381 Prolonged grief disorder: Secondary | ICD-10-CM

## 2019-09-01 NOTE — Progress Notes (Signed)
      [facility] Counselor/Therapist Progress Note  Patient ID: [name], MRN: [id number],    Date: 09/01/2019  Time Spent: 60 minutes  12:00noon to 1:00pm   Treatment Type: Individual Therapy  Reported Symptoms: anxiety, depression, grief. sadness, "it's been a hard month", anniversary of husband's death this past month  Mental Status Exam:  Appearance:   Casual     Behavior:  Appropriate, Sharing and Motivated  Motor:  Normal  Speech/Language:   Normal Rate  Affect:  anxious, depressed  Mood:  anxious and depressed  Thought process:  goal directed  Thought content:    WNL  Sensory/Perceptual disturbances:    WNL  Orientation:  oriented to person, place, time/date, situation, day of week, month of year and year  Attention:  Good  Concentration:  Good  Memory:  WNL  Fund of knowledge:   Good  Insight:    Good  Judgment:   Good  Impulse Control:  Good   Risk Assessment: Danger to Self:  No Self-injurious Behavior: No Danger to Others: No Avery to Warn:no Physical Aggression / Violence:No  Access to Firearms a concern: No  Gang Involvement:No   Subjective: Patient in today reporting anxiety, depression, and some sadness.  Past month was anniversary of husband's death which was difficult for patient.    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  Treatment Goals: Goals remain on tx plan as patient works on strategies to meet her goals. Progress will be noted each session in "Progress" section of Plan.  Long term goals: Reduce overall level, frequency, and intensity ofgrief andanxiety so that daily functioning is not impaired.  Short term goals: Increase understanding of beliefs and messages that lead to worry and anxiety, and how these relate to her  grief process.  Strategy: Explore cognitive messages that lead to anxiety responses and retrain in adaptive cognitions.(especially as these relate to her grief issues)  Progress: Patient today in office reporting symptoms of anxiety, grief issues, depression, and sadness.  Has been more social since last visit, and has remained alcohol-free and attending AA daily. Shared about her difficulty with anniversary date of husband's death past month. Processed some of her guilt and regrets more today re: husband's cancer and death. Discussed her homework re: guilt which was impactful for patient. To continue focusing on the present, what she can control vs can't, and looking for more positives rather than negatives.  Looking for a job and has put out several applications. Encouraged good self-care (emotional and physical) and contact with friends, and positive self-talk.  Goal review and progress noted with patient.  Next appt within 2 weeks.   Mathis Fare, LCSW

## 2019-09-13 ENCOUNTER — Other Ambulatory Visit: Payer: Self-pay | Admitting: Psychiatry

## 2019-09-13 DIAGNOSIS — F3289 Other specified depressive episodes: Secondary | ICD-10-CM

## 2019-09-13 DIAGNOSIS — F419 Anxiety disorder, unspecified: Secondary | ICD-10-CM

## 2019-09-15 ENCOUNTER — Ambulatory Visit: Payer: BC Managed Care – PPO | Admitting: Psychiatry

## 2019-09-15 ENCOUNTER — Telehealth: Payer: Self-pay | Admitting: Psychiatry

## 2019-09-15 MED ORDER — BENZOCAINE-MENTHOL 6-10 MG MT LOZG
1.00 | LOZENGE | OROMUCOSAL | Status: DC
Start: ? — End: 2019-09-15

## 2019-09-15 MED ORDER — TRAZODONE HCL 50 MG PO TABS
50.00 | ORAL_TABLET | ORAL | Status: DC
Start: ? — End: 2019-09-15

## 2019-09-15 MED ORDER — LORAZEPAM 2 MG/ML IJ SOLN
2.00 | INTRAMUSCULAR | Status: DC
Start: ? — End: 2019-09-15

## 2019-09-15 MED ORDER — DIPHENHYDRAMINE HCL 25 MG PO CAPS
50.00 | ORAL_CAPSULE | ORAL | Status: DC
Start: ? — End: 2019-09-15

## 2019-09-15 MED ORDER — DIPHENHYDRAMINE HCL 50 MG/ML IJ SOLN
50.00 | INTRAMUSCULAR | Status: DC
Start: ? — End: 2019-09-15

## 2019-09-15 MED ORDER — GUAIFENESIN 100 MG/5ML PO SYRP
200.00 | ORAL_SOLUTION | ORAL | Status: DC
Start: ? — End: 2019-09-15

## 2019-09-15 MED ORDER — QUINTABS PO TABS
1.00 | ORAL_TABLET | ORAL | Status: DC
Start: 2019-09-16 — End: 2019-09-15

## 2019-09-15 MED ORDER — HYDROXYZINE HCL 25 MG PO TABS
50.00 | ORAL_TABLET | ORAL | Status: DC
Start: ? — End: 2019-09-15

## 2019-09-15 MED ORDER — CETIRIZINE HCL 10 MG PO TABS
10.00 | ORAL_TABLET | ORAL | Status: DC
Start: ? — End: 2019-09-15

## 2019-09-15 MED ORDER — ACETAMINOPHEN 325 MG PO TABS
650.00 | ORAL_TABLET | ORAL | Status: DC
Start: ? — End: 2019-09-15

## 2019-09-15 MED ORDER — LOPERAMIDE HCL 2 MG PO CAPS
2.00 | ORAL_CAPSULE | ORAL | Status: DC
Start: ? — End: 2019-09-15

## 2019-09-15 MED ORDER — BISACODYL 5 MG PO TBEC
10.00 | DELAYED_RELEASE_TABLET | ORAL | Status: DC
Start: ? — End: 2019-09-15

## 2019-09-15 MED ORDER — SERTRALINE HCL 100 MG PO TABS
200.00 | ORAL_TABLET | ORAL | Status: DC
Start: 2019-09-16 — End: 2019-09-15

## 2019-09-15 MED ORDER — NICOTINE POLACRILEX 2 MG MT GUM
2.00 | CHEWING_GUM | OROMUCOSAL | Status: DC
Start: ? — End: 2019-09-15

## 2019-09-15 MED ORDER — ALUM & MAG HYDROXIDE-SIMETH 200-200-20 MG/5ML PO SUSP
30.00 | ORAL | Status: DC
Start: ? — End: 2019-09-15

## 2019-09-15 MED ORDER — GENERIC EXTERNAL MEDICATION
Status: DC
Start: ? — End: 2019-09-15

## 2019-09-15 MED ORDER — HALOPERIDOL 5 MG PO TABS
5.00 | ORAL_TABLET | ORAL | Status: DC
Start: ? — End: 2019-09-15

## 2019-09-15 MED ORDER — CLONIDINE HCL 0.1 MG PO TABS
0.10 | ORAL_TABLET | ORAL | Status: DC
Start: ? — End: 2019-09-15

## 2019-09-15 MED ORDER — SALINE NASAL SPRAY 0.65 % NA SOLN
1.00 | NASAL | Status: DC
Start: ? — End: 2019-09-15

## 2019-09-15 MED ORDER — CHLORDIAZEPOXIDE HCL 25 MG PO CAPS
25.00 | ORAL_CAPSULE | ORAL | Status: DC
Start: ? — End: 2019-09-15

## 2019-09-15 MED ORDER — ONDANSETRON 8 MG PO TBDP
8.00 | ORAL_TABLET | ORAL | Status: DC
Start: ? — End: 2019-09-15

## 2019-09-15 MED ORDER — GENERIC EXTERNAL MEDICATION
5.00 | Status: DC
Start: ? — End: 2019-09-15

## 2019-09-15 NOTE — Telephone Encounter (Signed)
Pt.'s brother called to say [name] cannot make it to her appt today. She is in the hospital in detox.

## 2019-09-22 ENCOUNTER — Telehealth: Payer: Self-pay | Admitting: Psychiatry

## 2019-09-22 NOTE — Telephone Encounter (Signed)
Pt having side effects from medication from another provider. Pt wants to see if she can talk to Shanda Bumps to see if she can help her. Pt has appt 5/26.

## 2019-09-22 NOTE — Telephone Encounter (Signed)
Patient aware.

## 2019-09-23 ENCOUNTER — Ambulatory Visit (INDEPENDENT_AMBULATORY_CARE_PROVIDER_SITE_OTHER): Payer: BC Managed Care – PPO | Admitting: Psychiatry

## 2019-09-23 ENCOUNTER — Encounter: Payer: Self-pay | Admitting: Psychiatry

## 2019-09-23 DIAGNOSIS — F3289 Other specified depressive episodes: Secondary | ICD-10-CM

## 2019-09-23 DIAGNOSIS — F419 Anxiety disorder, unspecified: Secondary | ICD-10-CM | POA: Diagnosis not present

## 2019-09-23 DIAGNOSIS — F5101 Primary insomnia: Secondary | ICD-10-CM | POA: Diagnosis not present

## 2019-09-23 DIAGNOSIS — F1021 Alcohol dependence, in remission: Secondary | ICD-10-CM | POA: Diagnosis not present

## 2019-09-23 MED ORDER — DOXEPIN HCL 10 MG PO CAPS: ORAL_CAPSULE | ORAL | 1 refills | Status: DC

## 2019-09-23 MED ORDER — TRAZODONE HCL 100 MG PO TABS: 200.0000 mg | ORAL_TABLET | Freq: Every day | ORAL | 1 refills | Status: DC

## 2019-09-23 MED ORDER — DISULFIRAM 250 MG PO TABS: 250.0000 mg | ORAL_TABLET | Freq: Every day | ORAL | 1 refills | Status: DC

## 2019-09-23 MED ORDER — SERTRALINE HCL 100 MG PO TABS: 200.0000 mg | ORAL_TABLET | Freq: Every day | ORAL | 2 refills | Status: DC

## 2019-09-23 MED ORDER — CLONIDINE HCL 0.1 MG PO TABS: 0.1000 mg | ORAL_TABLET | Freq: Every day | ORAL | 2 refills | Status: DC

## 2019-09-23 NOTE — Progress Notes (Signed)
[name] [id number] Oct 08, 1965 54 y.o.  Subjective:   Patient ID:  [name] is a 54 y.o. (DOB 1965-05-02) female.  Chief Complaint:  Chief Complaint  Patient presents with  . Alcohol Problem  . Anxiety  . Depression  . Insomnia    HPI [name] presents to the office today for follow-up of ETOH dependence, mood, and anxiety.  She reports that she started drinking again on Mother's day after she did not hear from one of her children. She reports that her ETOH continued for the next week. She reports that she then presented to [facility] on 09/14/19 for detox. She was prescribed Antabuse by the hospital and has taken Antabuse for the last 5 days.   She reports that she is feeling better today. Took 1/3 tab of Antabuse last night. Reports that she is switching it from daytime to night-time. She reports that she has been sleeping excessively since she started Antabuse on 09/17/19. Reports that she was slurring her speech, had an eye twitch, and was unsteady. Denies any n/v, diarrhea with antabuse. Reports that she has not had any ETOH or any products containing ETOH.  She denies any cravings to drink ETOH since hospitalization. Has been attending AA meetings. Reports that she is looking for a new sponsor.   She reports some possible depression which she attributes to frustration with not making some  improvements as much as she had hoped. Denies irritability. She reports that anxiety has been elevated. She reports some worry. Has had some physical s/s with anxiety. Denies panic attacks. She reports that she has been sleeping well and excessively since discharge. Sleeping about 16 hours in a 24-hour period. Energy has been low. Motivation has been low. She reports poor concentration and focus. Denies SI.   Past medication trials: Lexapro-does not recall significant response Sertraline- Taken for 2.5  years. Mirtazapine Wellbutrin Rexulti Abilify Topamax Trileptal Gabapentin Seroquel Hydroxyzine-ineffective BuSpar Trazodone Doxepin- Reports this was helpful when she was in rehab Clonidine- Helpful for anxiety Baclofen- did not notice any change in cravings   AIMS     Office Visit from 07/24/2019 in [facility]  AIMS Total Score  0    PHQ2-9     Office Visit from 06/26/2019 in [facility] Office Visit from 02/06/2013 in [facility]  PHQ-2 Total Score  3  0  PHQ-9 Total Score  8  --       Review of Systems:  Review of Systems  Eyes: Positive for visual disturbance.  Musculoskeletal: Negative for gait problem.  Neurological: Positive for speech difficulty.       She reports that her knees and legs have been shaking  Psychiatric/Behavioral:       Please refer to HPI    Medications: I have reviewed the patient's current medications.  Current Outpatient Medications  Medication Sig Dispense Refill  . cloNIDine (CATAPRES) 0.1 MG tablet Take 1 tablet (0.1 mg total) by mouth at bedtime. 30 tablet 2  . doxepin (SINEQUAN) 10 MG capsule TAKE 1-2 TABLETS BY MOUTH AT BEDTIME AS NEEDED 60 capsule 1  . Multiple Vitamins-Minerals (MULTIVITAMIN WITH MINERALS) tablet Take 1 tablet by mouth daily.    . naproxen sodium (ALEVE) 220 MG tablet Take 220 mg by mouth daily as needed.    Marland Kitchen REXULTI 3 MG TABS TAKE 1 TABLET BY MOUTH DAILY 30 tablet 1  . traZODone (DESYREL) 100 MG tablet Take 2 tablets (200 mg  total) by mouth at bedtime. 30 tablet 1  . albuterol (VENTOLIN HFA) 108 (90 Base) MCG/ACT inhaler Inhale into the lungs.    . disulfiram (ANTABUSE) 250 MG tablet Take 1 tablet (250 mg total) by mouth daily. 30 tablet 1  . sertraline (ZOLOFT) 100 MG tablet Take 2 tablets (200 mg total) by mouth daily. 60 tablet 2   No current facility-administered medications for this visit.    Medication Side Effects: Sedation and Other:  Slurred speech  Allergies:  Allergies  Allergen Reactions  . Erythromycin     Stomach  cramps    Past Medical History:  Diagnosis Date  . Alcohol dependence ([place])   . Allergy   . Depression   . Headache   . HTN (hypertension)     Family History  Problem Relation Age of Onset  . Cancer Mother 41       breast  . Depression Mother   . Heart disease Mother   . Macular degeneration Mother   . Diabetes Maternal Grandmother   . Heart disease Maternal Grandmother   . Stroke Maternal Grandmother   . Alcohol abuse Sister   . Depression Sister   . Alcohol abuse Paternal Grandmother   . Cancer Father   . COPD Father   . Depression Brother   . Early death Neg Hx     Social History   Socioeconomic History  . Marital status: Married    Spouse name: Not on file  . Number of children: Not on file  . Years of education: Not on file  . Highest education level: Not on file  Occupational History  . Not on file  Tobacco Use  . Smoking status: Never Smoker  . Smokeless tobacco: Never Used  Substance and Sexual Activity  . Alcohol use: Not Currently    Alcohol/week: 0.0 standard drinks  . Drug use: No  . Sexual activity: Yes    Birth control/protection: Surgical  Other Topics Concern  . Not on file  Social History Narrative  . Not on file   Social Determinants of Health   Financial Resource Strain:   . Difficulty of Paying Living Expenses:   Food Insecurity:   . Worried About Charity fundraiser in the Last Year:   . Arboriculturist in the Last Year:   Transportation Needs:   . Film/video editor (Medical):   Marland Kitchen Lack of Transportation (Non-Medical):   Physical Activity:   . Days of Exercise per Week:   . Minutes of Exercise per Session:   Stress:   . Feeling of Stress :   Social Connections:   . Frequency of Communication with Friends and Family:   . Frequency of Social Gatherings with Friends and Family:   . Attends Religious Services:   . Active Member of  Clubs or Organizations:   . Attends Archivist Meetings:   Marland Kitchen Marital Status:   Intimate Partner Violence:   . Fear of Current or Ex-Partner:   . Emotionally Abused:   Marland Kitchen Physically Abused:   . Sexually Abused:     Past Medical History, Surgical history, Social history, and Family history were reviewed and updated as appropriate.   Please see review of systems for further details on the patient's review from today.   Objective:   Physical Exam:  BP 97/64   Pulse 89   Physical Exam Constitutional:      General: She is not in acute distress.    Appearance:  Normal appearance.     Comments: Slurred speech  Musculoskeletal:        General: No deformity.  Neurological:     Mental Status: She is alert and oriented to person, place, and time.     Coordination: Coordination normal.  Psychiatric:        Attention and Perception: Attention and perception normal. She does not perceive auditory or visual hallucinations.        Mood and Affect: Mood normal. Mood is not anxious or depressed. Affect is not labile, blunt, angry or inappropriate.        Speech: Speech normal.        Behavior: Behavior normal. Behavior is cooperative.        Thought Content: Thought content normal. Thought content is not paranoid or delusional. Thought content does not include homicidal or suicidal ideation. Thought content does not include homicidal or suicidal plan.        Cognition and Memory: Cognition and memory normal.        Judgment: Judgment normal.     Comments: Insight intact     Lab Review:     Component Value Date/Time   NA 138 11/24/2015 2200   K 3.8 11/24/2015 2200   CL 106 11/24/2015 2200   CO2 21 (L) 11/24/2015 2200   GLUCOSE 90 11/24/2015 2200   BUN 14 11/24/2015 2200   CREATININE 0.71 11/24/2015 2200   CALCIUM 9.3 11/24/2015 2200   PROT 7.5 11/24/2015 2200   ALBUMIN 4.0 11/24/2015 2200   AST 91 (H) 11/24/2015 2200   ALT 61 (H) 11/24/2015 2200   ALKPHOS 93 11/24/2015  2200   BILITOT 0.9 11/24/2015 2200   GFRNONAA >60 11/24/2015 2200   GFRAA >60 11/24/2015 2200       Component Value Date/Time   WBC 9.8 11/24/2015 2200   RBC 3.78 (L) 11/24/2015 2200   HGB 12.0 11/24/2015 2200   HCT 35.7 (L) 11/24/2015 2200   PLT 282 11/24/2015 2200   MCV 94.4 11/24/2015 2200   MCH 31.7 11/24/2015 2200   MCHC 33.6 11/24/2015 2200   RDW 12.8 11/24/2015 2200    No results found for: POCLITH, LITHIUM   No results found for: PHENYTOIN, PHENOBARB, VALPROATE, CBMZ   .res Assessment: Plan:   Recommend stopping Clonidine due to low BP and discussed that this could be contributing to her feeling fatigued and unsteady. Recommend decreasing Antabuse to 250 mg po qd since she has taken the 500 mg po qd dose for almost 1 week and has had side effects at 500 mg dose and reports that side effects have improved after decreasing dose to 250 mg po qd for one day. Pt aware not to combine Antabuse with any ETOH or ETOH containing products, such as vinegars and extracts.  Continue Rexulti 3 mg po qd for depression and anxiety.  Continue Sertraline 200 mg po qd for depression and anxiety.  Continue Trazodone 200 mg po QHS for insomnia.  Recommend continuing therapy with Rockne Menghini, LCSW and AA.  Pt to f/u in 4 weeks or sooner if clinically indicated. Patient advised to contact office with any questions, adverse effects, or acute worsening in signs and symptoms.   Jazaria was seen today for alcohol problem, anxiety, depression and insomnia.  Diagnoses and all orders for this visit:  Anxiety -     cloNIDine (CATAPRES) 0.1 MG tablet; Take 1 tablet (0.1 mg total) by mouth at bedtime. -     sertraline (ZOLOFT) 100 MG  tablet; Take 2 tablets (200 mg total) by mouth daily.  Alcohol dependence in remission (HCC) -     cloNIDine (CATAPRES) 0.1 MG tablet; Take 1 tablet (0.1 mg total) by mouth at bedtime. -     disulfiram (ANTABUSE) 250 MG tablet; Take 1 tablet (250 mg total) by mouth  daily.  Primary insomnia -     doxepin (SINEQUAN) 10 MG capsule; TAKE 1-2 TABLETS BY MOUTH AT BEDTIME AS NEEDED -     traZODone (DESYREL) 100 MG tablet; Take 2 tablets (200 mg total) by mouth at bedtime.  Other depression -     sertraline (ZOLOFT) 100 MG tablet; Take 2 tablets (200 mg total) by mouth daily.     Please see After Visit Summary for patient specific instructions.  Future Appointments  Date Time Provider Department Center  09/29/2019 12:00 PM Mathis Fare, LCSW [facility] None  10/13/2019 12:00 PM Mathis Fare, LCSW [facility] None  10/21/2019  1:00 PM Corie Chiquito, PMHNP [facility] None  10/27/2019 12:00 PM Mathis Fare, LCSW [facility] None    No orders of the defined types were placed in this encounter.   -------------------------------

## 2019-09-29 ENCOUNTER — Other Ambulatory Visit: Payer: Self-pay

## 2019-09-29 ENCOUNTER — Ambulatory Visit (INDEPENDENT_AMBULATORY_CARE_PROVIDER_SITE_OTHER): Payer: BC Managed Care – PPO | Admitting: Psychiatry

## 2019-09-29 DIAGNOSIS — F4381 Prolonged grief disorder: Secondary | ICD-10-CM

## 2019-09-29 DIAGNOSIS — F4321 Adjustment disorder with depressed mood: Secondary | ICD-10-CM | POA: Diagnosis not present

## 2019-09-29 NOTE — Progress Notes (Signed)
      [facility] Counselor/Therapist Progress Note  Patient ID: PROMISE WELDIN, MRN: [id number],    Date: 09/29/2019  Time Spent: 60 mintues   12:00noon to 1:00pm  Treatment Type: Individual Therapy  Reported Symptoms: anxiety, sadness, depression, embarassment  Mental Status Exam:  Appearance:   Casual     Behavior:  Appropriate and Sharing  Motor:  Normal  Speech/Language:   Normal Rate  Affect:  anxious, some depression  Mood:  anxious and depressed  Thought process:  goal directed  Thought content:    WNL  Sensory/Perceptual disturbances:    WNL  Orientation:  oriented to person, place, time/date, situation, day of week, month of year and year  Attention:  Good/Fair  Concentration:  Good/Fair  Memory:  some forgetfulness mostly under stress  Fund of knowledge:   Good  Insight:    Fair  Judgment:   Fair  Impulse Control:  Fair   Risk Assessment: Danger to Self:  No Self-injurious Behavior: No Danger to Others: No Duty to Warn:no Physical Aggression / Violence:No  Access to Firearms a concern: No  Gang Involvement:No   Subjective: Patient in today reporting anxiety, sadness, depression, and embarassment in having to go back in for substance abuse treatment.    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  Treatment Goals: Goals remain on tx plan as patient works on strategies to meet her goals. Progress will be noted each session in "Progress" section of Plan.  Long term goals: Reduce overall level, frequency, and intensity ofgrief andanxiety so that daily functioning is not impaired.  Short term goals: Increase understanding of beliefs and messages that lead to worry and anxiety, and how these relate to her grief process.  Strategy: Explore  cognitive messages that lead to anxiety responses and retrain in adaptive cognitions.(especially as these relate to her grief issues)  Progress: First session today with patient after she relapsed and was recently discharged from treatment for Alcohol Abuse.  States she's embarassed that she's relapsed again but all friends and family have been supportive.  Processed lots of feelings of hurt, sadness, some anger, and embarassmen.  Wants to get a job.. Anger at her older son for not believing her all the time and anger at deceased husband "for leaving me in to deal with all this shit." Used the session time today to really listen and be present for patient as she talked through her feelings about herself and her family, that involves some interpersonal issues which she also wanted/needed to speak openly about.  Is attending AA multiple times per week. Patient to focus on what she can control versus what she cannot control, remaining in the present versus the past or future, looking for the positives versus negatives, and practicing positive self-talk going forward.   Goal review and progress/challenges noted.  Next appt within 2 weeks.   Mathis Fare, LCSW

## 2019-10-01 ENCOUNTER — Other Ambulatory Visit: Payer: Self-pay

## 2019-10-06 ENCOUNTER — Other Ambulatory Visit: Payer: Self-pay | Admitting: Psychiatry

## 2019-10-06 ENCOUNTER — Telehealth: Payer: Self-pay | Admitting: Psychiatry

## 2019-10-06 DIAGNOSIS — F419 Anxiety disorder, unspecified: Secondary | ICD-10-CM

## 2019-10-06 DIAGNOSIS — F3289 Other specified depressive episodes: Secondary | ICD-10-CM

## 2019-10-06 NOTE — Telephone Encounter (Signed)
Called patient to follow-up regarding how she is currently doing and if she is tolerating lower dose of Antabuse.  Left message asking patient to contact office to discuss how she is doing on lower dose of Antabuse and if side effects have improved.  Discussed that provider would also try to reach her again tomorrow.

## 2019-10-13 ENCOUNTER — Ambulatory Visit (INDEPENDENT_AMBULATORY_CARE_PROVIDER_SITE_OTHER): Payer: BC Managed Care – PPO | Admitting: Psychiatry

## 2019-10-13 ENCOUNTER — Other Ambulatory Visit: Payer: Self-pay

## 2019-10-13 DIAGNOSIS — F411 Generalized anxiety disorder: Secondary | ICD-10-CM
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
## 2019-10-21 ENCOUNTER — Other Ambulatory Visit: Payer: Self-pay

## 2019-10-21 ENCOUNTER — Encounter: Payer: Self-pay | Admitting: Psychiatry

## 2019-10-21 ENCOUNTER — Ambulatory Visit (INDEPENDENT_AMBULATORY_CARE_PROVIDER_SITE_OTHER): Payer: BC Managed Care – PPO | Admitting: Psychiatry

## 2019-10-21 DIAGNOSIS — F3289 Other specified depressive episodes: Secondary | ICD-10-CM | POA: Diagnosis not present

## 2019-10-21 DIAGNOSIS — F5101 Primary insomnia: Secondary | ICD-10-CM | POA: Diagnosis not present

## 2019-10-21 DIAGNOSIS — F419 Anxiety disorder, unspecified: Secondary | ICD-10-CM

## 2019-10-21 MED ORDER — TRAZODONE HCL 100 MG PO TABS: 200.0000 mg | ORAL_TABLET | Freq: Every day | ORAL | 2 refills | Status: DC

## 2019-10-21 MED ORDER — DOXEPIN HCL 10 MG PO CAPS: ORAL_CAPSULE | ORAL | 1 refills | Status: DC

## 2019-10-21 MED ORDER — BUPROPION HCL ER (XL) 150 MG PO TB24: 150.0000 mg | ORAL_TABLET | Freq: Every day | ORAL | 1 refills | Status: DC

## 2019-10-21 MED ORDER — REXULTI 3 MG PO TABS: 1.0000 | ORAL_TABLET | Freq: Every day | ORAL | 1 refills | Status: DC

## 2019-10-21 MED ORDER — SERTRALINE HCL 100 MG PO TABS: 200.0000 mg | ORAL_TABLET | Freq: Every day | ORAL | 2 refills | Status: DC
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
## 2019-10-27 ENCOUNTER — Other Ambulatory Visit: Payer: Self-pay

## 2019-10-27 ENCOUNTER — Ambulatory Visit (INDEPENDENT_AMBULATORY_CARE_PROVIDER_SITE_OTHER): Payer: BC Managed Care – PPO | Admitting: Psychiatry

## 2019-10-27 DIAGNOSIS — F3289 Other specified depressive episodes: Secondary | ICD-10-CM

## 2019-10-27 NOTE — Progress Notes (Signed)
      [facility] Counselor/Therapist Progress Note  Patient ID: [name], MRN: [id number],    Date: 10/27/2019  Time Spent: 60 minutes  12:00noon to 1:00pm  Treatment Type: Individual Therapy  Reported Symptoms: depression, anxiety, 7 wks sober today  Mental Status Exam:  Appearance:   Casual     Behavior:  Appropriate and Sharing  Motor:  Normal  Speech/Language:   Normal Rate  Affect:  anxious, some depression  Mood:  anxious and depressed  Thought process:  normal  Thought content:    WNL  Sensory/Perceptual disturbances:    WNL  Orientation:  oriented to person, place, time/date, situation, day of week, month of year and year  Attention:  Good  Concentration:  Good  Memory:  WNL  Fund of knowledge:   Good  Insight:    Good and Fair  Judgment:   Good currently  Impulse Control:  Good and Fair   Risk Assessment: Danger to Self:  No Self-injurious Behavior: No Danger to Others: No Duty to Warn:no Physical Aggression / Violence:No  Access to Firearms a concern: No  Gang Involvement:No   Subjective: Patient today reports anxiety and some depression. Wanting to get job, maybe even part-time.  Interventions: Cognitive Behavioral Therapy and Solution-Oriented/Positive Psychology  Diagnosis:   ICD-10-CM   1. Other depression  F32.89     Plan of Care: Patient not signing tx plan on computer screen due to COVID-19.  Treatment Goals: Goals remain on tx plan as patient works on strategies to meet her goals. Progress will be noted each session in "Progress" section of Plan.  Long term goals: Reduce overall level, frequency, and intensity ofgrief andanxiety so that daily functioning is not impaired.  Short term goals: Increase understanding of beliefs and messages that lead to worry and anxiety, and how these relate to her grief process.  Strategy: Explore cognitive messages that lead to anxiety responses and retrain in adaptive cognitions.(especially  as these relate to her grief issues)  Progress: Patient in today reporting some depression and anxiety. Denies any SI and reports feeling a little more upbeat.  Is 7 weeks sober today. States the meeting with her extended family a couple wks ago, turned out well and "they were just trying to help me."  Has met with her new AA sponsor this past week and that went well. She will be meeting with sponsor weekly.  Attending AA meetings daily. Really wanting to get a job, even part time and is still looking. Doesn't seem quite as depressed today and states "I don't feel quite as gloomy, and feel a little optimistic. Getting an [place] sponsor and not drinking  Has helped. Does bother her that her older son doesn't talk to her much right now but states that she understands and is just being patient for now. Sleep is good "for the most part." Staying sober, AA meeting attendance, positive self-talk, and being around people that she know are good for her, are all things patient agrees to keep working on.   Goal review and progress noted with patient.  Next appt within 2 weeks.   Shanon Ace, LCSW

## 2019-11-10 ENCOUNTER — Encounter: Payer: BC Managed Care – PPO | Admitting: Psychiatry

## 2019-11-10 NOTE — Progress Notes (Signed)
Created in error

## 2019-11-10 NOTE — Progress Notes (Deleted)
      [facility] Counselor/Therapist Progress Note  Patient ID: [name], MRN: [id number],    Date: 11/10/2019  Time Spent: 60 minutes  12:00noon to 1:00pm   Treatment Type: Individual Therapy  Reported Symptoms: depression, anxiety  Mental Status Exam:  Appearance:   Casual     Behavior:  {PSY:21022743}  Motor:  {PSY:22302}  Speech/Language:   {PSY:22685}  Affect:  {PSY:22687}  Mood:  {PSY:31886}  Thought process:  {PSY:31888}  Thought content:    {PSY:[id number]}  Sensory/Perceptual disturbances:    {PSY:[id number]}  Orientation:  oriented to person, place, time/date, situation, day of week, month of year and year  Attention:  {PSY:22877}  Concentration:  {PSY:956-074-4255}  Memory:  {[phone]}  Fund of knowledge:   {PSY:956-074-4255}  Insight:    {PSY:956-074-4255}  Judgment:   {PSY:956-074-4255}  Impulse Control:  {PSY:956-074-4255}   Risk Assessment: Danger to Self:  No Self-injurious Behavior: No Danger to Others: No Duty to Warn:no Physical Aggression / Violence:No  Access to Firearms a concern: No  Gang Involvement:No   Subjective: Patient today reporting    Interventions: Solution-Oriented/Positive Psychology and Ego-Supportive  Diagnosis:   ICD-10-CM   1. Other depression  F32.89     Plan:   Plan of Care: Patient not signing tx plan on computer screen due to COVID-19.  Treatment Goals: Goals remain on tx plan as patient works on strategies to meet her goals. Progress will be noted each session in "Progress" section of Plan.  Long term goals: Reduce overall level, frequency, and intensity ofgrief andanxiety so that daily functioning is not impaired.  Short term goals: Increase understanding of beliefs and messages that lead to worry and anxiety, and how these relate to her grief process.  Strategy: Explore cognitive messages that lead to anxiety responses and retrain in adaptive cognitions.(especially as these relate to her grief  issues)  Progress:     Patient in today reporting some depression and anxiety. Denies any SI and reports feeling a little more upbeat.  Is 7 weeks sober today. States the meeting with her extended family a couple wks ago, turned out well and "they were just trying to help me."  Has met with her new AA sponsor this past week and that went well. She will be meeting with sponsor weekly.  Attending AA meetings daily. Really wanting to get a job, even part time and is still looking. Doesn't seem quite as depressed today and states "I don't feel quite as gloomy, and feel a little optimistic. Getting an [place] sponsor and not drinking  Has helped. Does bother her that her older son doesn't talk to her much right now but states that she understands and is just being patient for now. Sleep is good "for the most part." Staying sober, AA meeting attendance, positive self-talk, and being around people that she know are good for her, are all things patient agrees to keep working on.      Goal review and progress noted with patient. Next appt within 2-3 weeks. Shanon Ace, LCSW                   This encounter was created in error - please disregard.

## 2019-11-19 ENCOUNTER — Encounter: Payer: Self-pay | Admitting: Psychiatry

## 2019-11-19 ENCOUNTER — Other Ambulatory Visit: Payer: Self-pay

## 2019-11-19 ENCOUNTER — Ambulatory Visit (INDEPENDENT_AMBULATORY_CARE_PROVIDER_SITE_OTHER): Payer: BC Managed Care – PPO | Admitting: Psychiatry

## 2019-11-19 DIAGNOSIS — F5101 Primary insomnia: Secondary | ICD-10-CM | POA: Diagnosis not present

## 2019-11-19 DIAGNOSIS — F419 Anxiety disorder, unspecified: Secondary | ICD-10-CM

## 2019-11-19 DIAGNOSIS — F3289 Other specified depressive episodes: Secondary | ICD-10-CM

## 2019-11-19 DIAGNOSIS — F1021 Alcohol dependence, in remission: Secondary | ICD-10-CM

## 2019-11-19 MED ORDER — TRAZODONE HCL 100 MG PO TABS: 200.0000 mg | ORAL_TABLET | Freq: Every day | ORAL | 2 refills | Status: DC

## 2019-11-19 MED ORDER — DISULFIRAM 250 MG PO TABS: 250.0000 mg | ORAL_TABLET | Freq: Every day | ORAL | 2 refills | Status: DC

## 2019-11-19 MED ORDER — DOXEPIN HCL 10 MG PO CAPS: ORAL_CAPSULE | ORAL | 2 refills | Status: DC

## 2019-11-19 MED ORDER — BUPROPION HCL ER (XL) 150 MG PO TB24: 150.0000 mg | ORAL_TABLET | Freq: Every day | ORAL | 2 refills | Status: DC

## 2019-11-19 MED ORDER — SERTRALINE HCL 100 MG PO TABS: 200.0000 mg | ORAL_TABLET | Freq: Every day | ORAL | 2 refills | Status: DC

## 2019-11-19 MED ORDER — REXULTI 3 MG PO TABS: 1.0000 | ORAL_TABLET | Freq: Every day | ORAL | 1 refills | Status: DC

## 2019-11-19 NOTE — Progress Notes (Signed)
[name] [id number] 05/04/1965 54 y.o.  Subjective:   Patient ID:  [name] is a 54 y.o. (DOB 1965-06-03) female.  Chief Complaint:  Chief Complaint  Patient presents with  . Follow-up    h/o depression, anxiety, insomnia, and ETOH dependence    HPI [name] presents to the office today for follow-up of depression and anxiety.Mood has improved. Less depressed. Anxiety has been controlled. Waking up multiple times a night, often to urinate. Has been out of Trazodone. Appetite has been ok. Energy and motivation have been good. Concentration has been fine. Denies SI.   Denies any recent ETOh use. Going to meetings.   Has an interview this afternoon   AIMS     Office Visit from 07/24/2019 in [facility]  AIMS Total Score 0    PHQ2-9     Office Visit from 10/21/2019 in [facility] Office Visit from 06/26/2019 in [facility] Office Visit from 02/06/2013 in [facility]  PHQ-2 Total Score 2 3 0  PHQ-9 Total Score 5 8 --       Review of Systems:  Review of Systems  Musculoskeletal: Negative for gait problem.  Neurological: Negative for tremors.  Psychiatric/Behavioral:       Please refer to HPI    Medications: I have reviewed the patient's current medications.  Current Outpatient Medications  Medication Sig Dispense Refill  . buPROPion (WELLBUTRIN XL) 150 MG 24 hr tablet Take 1 tablet (150 mg total) by mouth daily. 30 tablet 2  . disulfiram (ANTABUSE) 250 MG tablet Take 1 tablet (250 mg total) by mouth daily. 30 tablet 2  . ergocalciferol (VITAMIN D2) 1.25 MG (50000 UT) capsule Take by mouth.    . rosuvastatin (CRESTOR) 20 MG tablet Take by mouth.    . traZODone (DESYREL) 100 MG tablet Take 2 tablets (200 mg total) by mouth at bedtime. 60 tablet 2  . albuterol (VENTOLIN HFA) 108 (90 Base) MCG/ACT inhaler Inhale into the lungs.    . Brexpiprazole (REXULTI) 3 MG TABS Take 1 tablet (3 mg total)  by mouth daily. 30 tablet 1  . doxepin (SINEQUAN) 10 MG capsule TAKE 1-2 TABLETS BY MOUTH AT BEDTIME AS NEEDED 60 capsule 2  . Multiple Vitamins-Minerals (MULTIVITAMIN WITH MINERALS) tablet Take 1 tablet by mouth daily.    . naproxen sodium (ALEVE) 220 MG tablet Take 220 mg by mouth daily as needed.    . sertraline (ZOLOFT) 100 MG tablet Take 2 tablets (200 mg total) by mouth daily. 60 tablet 2   No current facility-administered medications for this visit.    Medication Side Effects: None  Allergies:  Allergies  Allergen Reactions  . Erythromycin     Stomach  cramps    Past Medical History:  Diagnosis Date  . Alcohol dependence (HCC)   . Allergy   . Depression   . Headache   . HTN (hypertension)   . Vitamin D deficiency     Family History  Problem Relation Age of Onset  . Cancer Mother 40       breast  . Depression Mother   . Heart disease Mother   . Macular degeneration Mother   . Diabetes Maternal Grandmother   . Heart disease Maternal Grandmother   . Stroke Maternal Grandmother   . Alcohol abuse Sister   . Depression Sister   . Alcohol abuse Paternal Grandmother   . Cancer Father   . COPD Father   .  Depression Brother   . Early death Neg Hx     Social History   Socioeconomic History  . Marital status: Married    Spouse name: Not on file  . Number of children: Not on file  . Years of education: Not on file  . Highest education level: Not on file  Occupational History  . Not on file  Tobacco Use  . Smoking status: Never Smoker  . Smokeless tobacco: Never Used  Substance and Sexual Activity  . Alcohol use: Not Currently    Alcohol/week: 0.0 standard drinks  . Drug use: No  . Sexual activity: Yes    Birth control/protection: Surgical  Other Topics Concern  . Not on file  Social History Narrative  . Not on file   Social Determinants of Health   Financial Resource Strain:   . Difficulty of Paying Living Expenses:   Food Insecurity:   . Worried  About Programme researcher, broadcasting/film/video in the Last Year:   . Barista in the Last Year:   Transportation Needs:   . Freight forwarder (Medical):   Marland Kitchen Lack of Transportation (Non-Medical):   Physical Activity:   . Days of Exercise per Week:   . Minutes of Exercise per Session:   Stress:   . Feeling of Stress :   Social Connections:   . Frequency of Communication with Friends and Family:   . Frequency of Social Gatherings with Friends and Family:   . Attends Religious Services:   . Active Member of Clubs or Organizations:   . Attends Banker Meetings:   Marland Kitchen Marital Status:   Intimate Partner Violence:   . Fear of Current or Ex-Partner:   . Emotionally Abused:   Marland Kitchen Physically Abused:   . Sexually Abused:     Past Medical History, Surgical history, Social history, and Family history were reviewed and updated as appropriate.   Please see review of systems for further details on the patient's review from today.   Objective:   Physical Exam:  There were no vitals taken for this visit.  Physical Exam Constitutional:      General: She is not in acute distress. Musculoskeletal:        General: No deformity.  Neurological:     Mental Status: She is alert and oriented to person, place, and time.     Coordination: Coordination normal.  Psychiatric:        Attention and Perception: Attention and perception normal. She does not perceive auditory or visual hallucinations.        Mood and Affect: Mood normal. Mood is not anxious or depressed. Affect is not labile, blunt, angry or inappropriate.        Speech: Speech normal.        Behavior: Behavior normal.        Thought Content: Thought content normal. Thought content is not paranoid or delusional. Thought content does not include homicidal or suicidal ideation. Thought content does not include homicidal or suicidal plan.        Cognition and Memory: Cognition and memory normal.        Judgment: Judgment normal.      Comments: Insight intact     Lab Review:     Component Value Date/Time   NA 138 11/24/2015 2200   K 3.8 11/24/2015 2200   CL 106 11/24/2015 2200   CO2 21 (L) 11/24/2015 2200   GLUCOSE 90 11/24/2015 2200   BUN 14 11/24/2015  2200   CREATININE 0.71 11/24/2015 2200   CALCIUM 9.3 11/24/2015 2200   PROT 7.5 11/24/2015 2200   ALBUMIN 4.0 11/24/2015 2200   AST 91 (H) 11/24/2015 2200   ALT 61 (H) 11/24/2015 2200   ALKPHOS 93 11/24/2015 2200   BILITOT 0.9 11/24/2015 2200   GFRNONAA >60 11/24/2015 2200   GFRAA >60 11/24/2015 2200       Component Value Date/Time   WBC 9.8 11/24/2015 2200   RBC 3.78 (L) 11/24/2015 2200   HGB 12.0 11/24/2015 2200   HCT 35.7 (L) 11/24/2015 2200   PLT 282 11/24/2015 2200   MCV 94.4 11/24/2015 2200   MCH 31.7 11/24/2015 2200   MCHC 33.6 11/24/2015 2200   RDW 12.8 11/24/2015 2200    No results found for: POCLITH, LITHIUM   No results found for: PHENYTOIN, PHENOBARB, VALPROATE, CBMZ   .res Assessment: Plan:   Will continue current plan of care since target signs and symptoms are well controlled without any tolerability issues. Recommend continuing psychotherapy with Rockne Menghini, LCSW.  Pt to f/u in 3 months or sooner if clinically indicated.  Patient advised to contact office with any questions, adverse effects, or acute worsening in signs and symptoms.  Denajah was seen today for follow-up.  Diagnoses and all orders for this visit:  Primary insomnia -     traZODone (DESYREL) 100 MG tablet; Take 2 tablets (200 mg total) by mouth at bedtime. -     doxepin (SINEQUAN) 10 MG capsule; TAKE 1-2 TABLETS BY MOUTH AT BEDTIME AS NEEDED  Other depression -     buPROPion (WELLBUTRIN XL) 150 MG 24 hr tablet; Take 1 tablet (150 mg total) by mouth daily. -     Brexpiprazole (REXULTI) 3 MG TABS; Take 1 tablet (3 mg total) by mouth daily. -     sertraline (ZOLOFT) 100 MG tablet; Take 2 tablets (200 mg total) by mouth daily.  Anxiety -     Brexpiprazole  (REXULTI) 3 MG TABS; Take 1 tablet (3 mg total) by mouth daily. -     sertraline (ZOLOFT) 100 MG tablet; Take 2 tablets (200 mg total) by mouth daily.  Alcohol dependence in remission (HCC) -     disulfiram (ANTABUSE) 250 MG tablet; Take 1 tablet (250 mg total) by mouth daily.     Please see After Visit Summary for patient specific instructions.  Future Appointments  Date Time Provider Department Center  11/24/2019 12:00 PM Mathis Fare, LCSW [facility] None  12/08/2019 12:00 PM Mathis Fare, LCSW [facility] None  12/22/2019 12:00 PM Mathis Fare, LCSW [facility] None    No orders of the defined types were placed in this encounter.   -------------------------------

## 2019-11-24 ENCOUNTER — Ambulatory Visit (INDEPENDENT_AMBULATORY_CARE_PROVIDER_SITE_OTHER): Payer: BC Managed Care – PPO | Admitting: Psychiatry

## 2019-11-24 ENCOUNTER — Other Ambulatory Visit: Payer: Self-pay

## 2019-11-24 DIAGNOSIS — F411 Generalized anxiety disorder: Secondary | ICD-10-CM

## 2019-11-24 NOTE — Progress Notes (Signed)
      [facility] Counselor/Therapist Progress Note  Patient ID: [name], MRN: [id number],    Date: 11/24/2019  Time Spent: 60 minutes   12:00noon to 1:00pm    Treatment Type: Individual Therapy  Reported Symptoms: "some anxiety, some depression"  Mental Status Exam:  Appearance:   Casual     Behavior:  Appropriate and Sharing  Motor:  Normal  Speech/Language:   Normal Rate  Affect:  anxious, some "mild depression"  Mood:  anxious and depressed  Thought process:  normal  Thought content:    WNL  Sensory/Perceptual disturbances:    WNL  Orientation:  oriented to person, place, time/date, situation, day of week, month of year and year  Attention:  Good  Concentration:  Good  Memory:  WNL  Fund of knowledge:   Good  Insight:    Good and Fair  Judgment:   Good  Impulse Control:  Good   Risk Assessment: Danger to Self:  No Self-injurious Behavior: No Danger to Others: No Duty to Warn:no Physical Aggression / Violence:No  Access to Firearms a concern: No  Gang Involvement:No   Subjective: Patient today reporting some anxiety and depression.   Interventions: Cognitive Behavioral Therapy and Solution-Oriented/Positive Psychology  Diagnosis:   ICD-10-CM   1. Generalized anxiety disorder  F41.1    Plan of Care: Patient not signing tx plan on computer screen due to COVID-19.  Treatment Goals: Goals remain on tx plan as patient works on strategies to meet her goals. Progress will be noted each session in "Progress" section of Plan.  Long term goals: Reduce overall level, frequency, and intensity ofgrief andanxiety so that daily functioning is not impaired.  Short term goals: Increase understanding of beliefs and messages that lead to worry and anxiety, and how these relate to her grief process.  Strategy: Explore cognitive messages that lead to anxiety responses and retrain in adaptive cognitions.(especially as these relate to her grief  issues)  Progress: Patient in office today reporting anxiety and depression. Seems a little more upbeat today. Less depressed, but still bothered some with issues with older son and discussed in more detail today. "Some loneliness but not as bad and will usually call a friend, get out for a drive or run errands, and that helps."  Applying for jobs but also nervous about a possible return to work, and processed her concerns about potential work and "being older in the work force" and "being new in a job" and just getting back into a daily routine again. Still attending daily AA meetings and meets with sponsor weekly and "I'm on step 2." Trying to cope better with being by herself, but connected with some friends who she meets for lunch, dinner, etc. "Wanting and needing more connectedness for me" and talks about how she can reach out more to people and to begin doing this within the next couple weeks between appts. Will follow up on this next session.   Goal review and progress noted with patient.  Next appt within 2-3 weeks.   Mathis Fare, LCSW

## 2019-12-04 ENCOUNTER — Emergency Department (HOSPITAL_COMMUNITY): Payer: BC Managed Care – PPO

## 2019-12-04 ENCOUNTER — Other Ambulatory Visit: Payer: Self-pay

## 2019-12-04 ENCOUNTER — Emergency Department (HOSPITAL_COMMUNITY)
Admission: EM | Admit: 2019-12-04 | Discharge: 2019-12-04 | Disposition: A | Discharge status: Home / Self Care | Payer: BC Managed Care – PPO | Attending: Emergency Medicine | Admitting: Emergency Medicine

## 2019-12-04 DIAGNOSIS — I10 Essential (primary) hypertension: Secondary | ICD-10-CM | POA: Insufficient documentation

## 2019-12-04 DIAGNOSIS — S9304XA Dislocation of right ankle joint, initial encounter: Secondary | ICD-10-CM | POA: Diagnosis not present

## 2019-12-04 DIAGNOSIS — W19XXXA Unspecified fall, initial encounter: Secondary | ICD-10-CM | POA: Insufficient documentation

## 2019-12-04 DIAGNOSIS — T148XXA Other injury of unspecified body region, initial encounter: Secondary | ICD-10-CM

## 2019-12-04 DIAGNOSIS — S82841A Displaced bimalleolar fracture of right lower leg, initial encounter for closed fracture: Secondary | ICD-10-CM | POA: Insufficient documentation

## 2019-12-04 DIAGNOSIS — Y999 Unspecified external cause status: Secondary | ICD-10-CM | POA: Insufficient documentation

## 2019-12-04 DIAGNOSIS — Y9389 Activity, other specified: Secondary | ICD-10-CM | POA: Diagnosis not present

## 2019-12-04 DIAGNOSIS — Y929 Unspecified place or not applicable: Secondary | ICD-10-CM | POA: Insufficient documentation

## 2019-12-04 DIAGNOSIS — F10920 Alcohol use, unspecified with intoxication, uncomplicated: Secondary | ICD-10-CM

## 2019-12-04 DIAGNOSIS — F10129 Alcohol abuse with intoxication, unspecified: Secondary | ICD-10-CM | POA: Insufficient documentation

## 2019-12-04 DIAGNOSIS — S99911A Unspecified injury of right ankle, initial encounter: Secondary | ICD-10-CM | POA: Diagnosis present

## 2019-12-04 LAB — URINALYSIS, COMPLETE (UACMP) WITH MICROSCOPIC
Bilirubin Urine: NEGATIVE
Glucose, UA: NEGATIVE mg/dL
Hgb urine dipstick: NEGATIVE
Ketones, ur: NEGATIVE mg/dL
Nitrite: NEGATIVE
Protein, ur: NEGATIVE mg/dL
Specific Gravity, Urine: 1.008 (ref 1.005–1.030)
pH: 6 (ref 5.0–8.0)

## 2019-12-04 LAB — RAPID URINE DRUG SCREEN, HOSP PERFORMED
Amphetamines: NOT DETECTED
Barbiturates: NOT DETECTED
Benzodiazepines: POSITIVE — AB
Cocaine: NOT DETECTED
Opiates: NOT DETECTED
Tetrahydrocannabinol: NOT DETECTED

## 2019-12-04 LAB — COMPREHENSIVE METABOLIC PANEL
ALT: 27 U/L (ref 0–44)
AST: 37 U/L (ref 15–41)
Albumin: 3.7 g/dL (ref 3.5–5.0)
Alkaline Phosphatase: 67 U/L (ref 38–126)
Anion gap: 12 (ref 5–15)
BUN: 13 mg/dL (ref 6–20)
CO2: 22 mmol/L (ref 22–32)
Calcium: 8.6 mg/dL — ABNORMAL LOW (ref 8.9–10.3)
Chloride: 105 mmol/L (ref 98–111)
Creatinine, Ser: 0.87 mg/dL (ref 0.44–1.00)
GFR calc Af Amer: >60 mL/min (ref >60)
GFR calc non Af Amer: >60 mL/min (ref >60)
Glucose, Bld: 116 mg/dL — ABNORMAL HIGH (ref 70–99)
Potassium: 3.4 mmol/L — ABNORMAL LOW (ref 3.5–5.1)
Sodium: 139 mmol/L (ref 135–145)
Total Bilirubin: 0.8 mg/dL (ref 0.3–1.2)
Total Protein: 6.9 g/dL (ref 6.5–8.1)

## 2019-12-04 LAB — CBC WITH DIFFERENTIAL/PLATELET
Abs Immature Granulocytes: 0.04 10*3/uL (ref 0.00–0.07)
Basophils Absolute: 0 10*3/uL (ref 0.0–0.1)
Basophils Relative: 0 %
Eosinophils Absolute: 0 10*3/uL (ref 0.0–0.5)
Eosinophils Relative: 0 %
HCT: 39.2 % (ref 36.0–46.0)
Hemoglobin: 13.4 g/dL (ref 12.0–15.0)
Immature Granulocytes: 0 %
Lymphocytes Relative: 20 %
Lymphs Abs: 2.4 10*3/uL (ref 0.7–4.0)
MCH: 31.6 pg (ref 26.0–34.0)
MCHC: 34.2 g/dL (ref 30.0–36.0)
MCV: 92.5 fL (ref 80.0–100.0)
Monocytes Absolute: 0.5 10*3/uL (ref 0.1–1.0)
Monocytes Relative: 4 %
Neutro Abs: 9.1 10*3/uL — ABNORMAL HIGH (ref 1.7–7.7)
Neutrophils Relative %: 76 %
Platelets: 265 10*3/uL (ref 150–400)
RBC: 4.24 MIL/uL (ref 3.87–5.11)
RDW: 12.4 % (ref 11.5–15.5)
WBC: 12.1 10*3/uL — ABNORMAL HIGH (ref 4.0–10.5)
nRBC: 0 % (ref 0.0–0.2)

## 2019-12-04 LAB — I-STAT BETA HCG BLOOD, ED (MC, WL, AP ONLY): I-stat hCG, quantitative: <5 m[IU]/mL (ref <5)

## 2019-12-04 LAB — SALICYLATE LEVEL: Salicylate Lvl: <7 mg/dL — ABNORMAL LOW (ref 7.0–30.0)

## 2019-12-04 LAB — ACETAMINOPHEN LEVEL: Acetaminophen (Tylenol), Serum: <10 ug/mL — ABNORMAL LOW (ref 10–30)

## 2019-12-04 LAB — ETHANOL: Alcohol, Ethyl (B): 305 mg/dL (ref <10)

## 2019-12-04 LAB — AMMONIA: Ammonia: 16 umol/L (ref 9–35)

## 2019-12-04 MED ORDER — IBUPROFEN 600 MG PO TABS
600.0000 mg | ORAL_TABLET | Freq: Four times a day (QID) | ORAL | 0 refills | Status: DC | PRN
Start: 2019-12-04 — End: 2022-05-19

## 2019-12-04 MED ORDER — MIDAZOLAM HCL 2 MG/2ML IJ SOLN
INTRAMUSCULAR | Status: AC
  Administered 2019-12-04: 0.5 mg via INTRAVENOUS
  Filled 2019-12-04: qty 2

## 2019-12-04 MED ORDER — SODIUM CHLORIDE 0.9 % IV BOLUS
1000.0000 mL | Freq: Once | INTRAVENOUS | Status: AC
  Administered 2019-12-04: 1000 mL via INTRAVENOUS

## 2019-12-04 MED ORDER — SODIUM CHLORIDE 0.9 % IV SOLN: INTRAVENOUS | Status: DC

## 2019-12-04 MED ORDER — HYDROCODONE-ACETAMINOPHEN 5-325 MG PO TABS: 1.0000 | ORAL_TABLET | ORAL | 0 refills | Status: DC | PRN

## 2019-12-04 MED ORDER — OXYCODONE-ACETAMINOPHEN 5-325 MG PO TABS
1.0000 | ORAL_TABLET | Freq: Once | ORAL | Status: AC
  Administered 2019-12-04: 1 via ORAL
  Filled 2019-12-04: qty 1

## 2019-12-04 MED ORDER — MIDAZOLAM HCL 2 MG/2ML IJ SOLN
INTRAMUSCULAR | Status: AC | PRN
  Administered 2019-12-04: 0.5 mg via INTRAVENOUS

## 2019-12-04 MED ORDER — FENTANYL CITRATE (PF) 100 MCG/2ML IJ SOLN
50.0000 ug | Freq: Once | INTRAMUSCULAR | Status: AC
  Administered 2019-12-04: 50 ug via INTRAVENOUS
  Filled 2019-12-04: qty 2

## 2019-12-04 MED ORDER — MIDAZOLAM HCL 2 MG/2ML IJ SOLN: 0.5000 mg | Freq: Once | INTRAMUSCULAR | Status: AC

## 2019-12-04 MED ORDER — ETOMIDATE 2 MG/ML IV SOLN
INTRAVENOUS | Status: AC | PRN
  Administered 2019-12-04: 15 mg via INTRAVENOUS

## 2019-12-04 MED ORDER — MORPHINE SULFATE (PF) 4 MG/ML IV SOLN
4.0000 mg | Freq: Once | INTRAVENOUS | Status: AC
  Administered 2019-12-04: 4 mg via INTRAVENOUS
  Filled 2019-12-04: qty 1

## 2019-12-04 MED ORDER — ONDANSETRON HCL 4 MG/2ML IJ SOLN
4.0000 mg | Freq: Once | INTRAMUSCULAR | Status: AC
  Administered 2019-12-04: 4 mg via INTRAVENOUS
  Filled 2019-12-04: qty 2

## 2019-12-04 MED ORDER — ETOMIDATE 2 MG/ML IV SOLN
10.0000 mg | Freq: Once | INTRAVENOUS | Status: AC
  Administered 2019-12-04: 10 mg via INTRAVENOUS
  Filled 2019-12-04: qty 10

## 2019-12-04 MED ORDER — MORPHINE SULFATE (PF) 4 MG/ML IV SOLN: 4.0000 mg | Freq: Once | INTRAVENOUS | Status: DC

## 2019-12-04 NOTE — Discharge Instructions (Addendum)
Non-weight bearing right ankle.  Keep leg elevated and iced.

## 2019-12-04 NOTE — ED Provider Notes (Signed)
[facility] Provider Note   CSN: [id number] Arrival date & time: 12/04/19  1622     History Chief Complaint  Patient presents with  . Fall    [name] is a 54 y.o. female.  Pt presents to the ED today with a fall.  Pt has a hx of alcohol abuse and just got out of rehab.  Family came home and found her on the ground.  Pt said the last thing she remembers before EMS arrived was being in the kitchen.  She admits to drinking 1 bottle of wine.  The pt has an obvious deformity of the right ankle, but denies any other injury.  EMS said pt was very confused when they first arrived.  ? Head trauma.  LOC unknown.  Pt's sister thinks she tripped and fell in her room.        Past Medical History:  Diagnosis Date  . Alcohol dependence (HCC)   . Allergy   . Depression   . Headache   . HTN (hypertension)   . Vitamin D deficiency     Patient Active Problem List   Diagnosis Date Noted  . Alcohol use disorder, severe, dependence (HCC) 03/30/2019  . Adjustment disorder with mixed anxiety and depressed mood 02/10/2018  . Insomnia 04/07/2015  . Alcohol use disorder, severe, in early remission, dependence (HCC) 08/24/2014  . Major depressive disorder, recurrent episode, moderate (HCC) 06/24/2014  . Substance induced mood disorder (HCC) 11/20/2013    Past Surgical History:  Procedure Laterality Date  . CESAREAN SECTION       OB History   No obstetric history on file.     Family History  Problem Relation Age of Onset  . Cancer Mother 255       breast  . Depression Mother   . Heart disease Mother   . Macular degeneration Mother   . Diabetes Maternal Grandmother   . Heart disease Maternal Grandmother   . Stroke Maternal Grandmother   . Alcohol abuse Sister   . Depression Sister   . Alcohol abuse Paternal Grandmother   . Cancer Father   . COPD Father   . Depression Brother   . Early death Neg Hx     Social History   Tobacco Use    . Smoking status: Never Smoker  . Smokeless tobacco: Never Used  Substance Use Topics  . Alcohol use: Not Currently    Alcohol/week: 0.0 standard drinks  . Drug use: No    Home Medications Prior to Admission medications   Medication Sig Start Date End Date Taking? Authorizing Provider  albuterol (VENTOLIN HFA) 108 (90 Base) MCG/ACT inhaler Inhale 1 puff into the lungs every 6 (six) hours as needed for wheezing or shortness of breath.  07/01/18  Yes [provider]  ARIPiprazole (ABILIFY) 5 MG tablet Take 5 mg by mouth daily.   Yes [provider]  brexpiprazole (REXULTI) 2 MG TABS tablet Take 2 mg by mouth daily.   Yes [provider]  buPROPion (WELLBUTRIN XL) 150 MG 24 hr tablet Take 1 tablet (150 mg total) by mouth daily. 11/19/19  Yes Corie Chiquitoarter, Jessica, PMHNP  cloNIDine (CATAPRES) 0.1 MG tablet Take 0.1 mg by mouth daily as needed (fOR BLOOD PRESSURE).   Yes [provider]  disulfiram (ANTABUSE) 250 MG tablet Take 1 tablet (250 mg total) by mouth daily. 11/19/19  Yes Corie Chiquitoarter, Jessica, PMHNP  doxepin (SINEQUAN) 10 MG capsule TAKE 1-2 TABLETS BY MOUTH  AT BEDTIME AS NEEDED Patient taking differently: Take 10 mg by mouth daily as needed (aNXIETY).  11/19/19  Yes Corie Chiquito, PMHNP  ergocalciferol (VITAMIN D2) 1.25 MG (50000 UT) capsule Take 50,000 Units by mouth once a week. Tuesdays   Yes [provider]  Multiple Vitamins-Minerals (MULTIVITAMIN WITH MINERALS) tablet Take 1 tablet by mouth daily.   Yes [provider]  naproxen sodium (ALEVE) 220 MG tablet Take 220 mg by mouth daily as needed (For pain).    Yes [provider]  OLANZapine (ZYPREXA) 5 MG tablet Take 5 mg by mouth at bedtime.   Yes [provider]  rosuvastatin (CRESTOR) 20 MG tablet Take 20 mg by mouth every evening.  11/10/19  Yes [provider]  sertraline (ZOLOFT) 100 MG tablet Take 2 tablets (200 mg total) by mouth daily. 11/19/19 01/18/20 Yes  Corie Chiquito, PMHNP  Brexpiprazole (REXULTI) 3 MG TABS Take 1 tablet (3 mg total) by mouth daily. Patient not taking: Reported on 12/04/2019 11/19/19   Corie Chiquito, PMHNP  HYDROcodone-acetaminophen (NORCO/VICODIN) 5-325 MG tablet Take 1 tablet by mouth every 4 (four) hours as needed. 12/04/19   Jacalyn Lefevre, MD  ibuprofen (ADVIL) 600 MG tablet Take 1 tablet (600 mg total) by mouth every 6 (six) hours as needed. 12/04/19   Jacalyn Lefevre, MD  traZODone (DESYREL) 100 MG tablet Take 2 tablets (200 mg total) by mouth at bedtime. Patient not taking: Reported on 12/04/2019 11/19/19 12/19/19  Corie Chiquito, PMHNP    Allergies    Erythromycin  Review of Systems   Review of Systems  Musculoskeletal:       Right ankle pain  All other systems reviewed and are negative.   Physical Exam Updated Vital Signs BP 112/81 (BP Location: Right Arm)   Pulse 100   Temp 98.1 F (36.7 C) (Oral)   Resp (!) 23   Ht  (1.651 m)   Wt 90.7 kg   SpO2 99%   BMI 33.28 kg/m   Physical Exam Vitals and nursing note reviewed.  Constitutional:      Appearance: Normal appearance. She is obese.  HENT:     Head: Normocephalic and atraumatic.     Right Ear: External ear normal.     Left Ear: External ear normal.     Nose: Nose normal.     Mouth/Throat:     Mouth: Mucous membranes are moist.     Pharynx: Oropharynx is clear.  Eyes:     Extraocular Movements: Extraocular movements intact.     Conjunctiva/sclera: Conjunctivae normal.     Pupils: Pupils are equal, round, and reactive to light.  Neck:     Comments: Pt in a c-collar Cardiovascular:     Rate and Rhythm: Normal rate and regular rhythm.     Pulses: Normal pulses.     Heart sounds: Normal heart sounds.  Pulmonary:     Effort: Pulmonary effort is normal.     Breath sounds: Normal breath sounds.  Abdominal:     General: Abdomen is flat. Bowel sounds are normal.     Palpations: Abdomen is soft.  Musculoskeletal:     Right ankle:  Deformity and ecchymosis present. Tenderness present. Decreased range of motion.  Skin:    General: Skin is warm.     Capillary Refill: Capillary refill takes less than 2 seconds.  Neurological:     General: No focal deficit present.     Mental Status: She is alert and oriented to person, place,  and time.  Psychiatric:        Mood and Affect: Mood normal.        Behavior: Behavior normal.     ED Results / Procedures / Treatments   Labs (all labs ordered are listed, but only abnormal results are displayed) Labs Reviewed  COMPREHENSIVE METABOLIC PANEL - Abnormal; Notable for the following components:      Result Value   Potassium 3.4 (*)    Glucose, Bld 116 (*)    Calcium 8.6 (*)    All other components within normal limits  CBC WITH DIFFERENTIAL/PLATELET - Abnormal; Notable for the following components:   WBC 12.1 (*)    Neutro Abs 9.1 (*)    All other components within normal limits  URINALYSIS, COMPLETE (UACMP) WITH MICROSCOPIC - Abnormal; Notable for the following components:   Leukocytes,Ua TRACE (*)    Bacteria, UA MANY (*)    All other components within normal limits  RAPID URINE DRUG SCREEN, HOSP PERFORMED - Abnormal; Notable for the following components:   Benzodiazepines POSITIVE (*)    All other components within normal limits  ETHANOL - Abnormal; Notable for the following components:   Alcohol, Ethyl (B) 305 (*)    All other components within normal limits  ACETAMINOPHEN LEVEL - Abnormal; Notable for the following components:   Acetaminophen (Tylenol), Serum <10 (*)    All other components within normal limits  SALICYLATE LEVEL - Abnormal; Notable for the following components:   Salicylate Lvl <7.0 (*)    All other components within normal limits  AMMONIA  I-STAT BETA HCG BLOOD, ED ([facility], [facility], [facility] ONLY)  CBG MONITORING, ED    EKG EKG Interpretation  Date/Time:  Friday December 04 2019 17:01:12 EDT Ventricular Rate:  86 PR Interval:    QRS Duration: 104 QT  Interval:  404 QTC Calculation: 484 R Axis:   41 Text Interpretation: Sinus rhythm Low voltage, precordial leads Borderline T abnormalities, anterior leads No old tracing to compare Confirmed by Jacalyn Lefevre [id number]) on 12/04/2019 5:28:36 PM   Radiology DG Chest 2 View  Result Date: 12/04/2019 CLINICAL DATA:  Fall EXAM: CHEST - 2 VIEW COMPARISON:  08/30/2015 FINDINGS: Heart size upper normal. Negative for heart failure or edema. Mild reticular markings in the left mid lung are present in the new since the prior study. No pleural effusion. No fracture identified. IMPRESSION: Subtle airspace disease left mid lung. Possible early pneumonia or atelectasis. Electronically Signed   By: Marlan Palau M.D.   On: 12/04/2019 17:52   DG Ankle Complete Right  Result Date: 12/04/2019 CLINICAL DATA:  Ankle pain, fall EXAM: RIGHT ANKLE - COMPLETE 3+ VIEW COMPARISON:  None. FINDINGS: Obliquely oriented comminuted, transsyndesmotic distal fibular/lateral malleolar fracture (Weber B) Transverse to slightly oblique comminuted fracture of the medial malleolus. Suspect tiny avulsive fragmentation along the posterior malleolus as well superimposed by the talus limiting evaluation. Posterior talar dislocation. Diffuse circumferential swelling and large ankle joint effusion is present. No additional acute fractures are seen. Corticated os trigonum and os peroneum are present. Plantar calcaneal spur. Midfoot and hindfoot alignment is grossly preserved though incompletely assessed on these nondedicated, nonweightbearing films. IMPRESSION: Fractures of the medial and lateral malleoli with possible avulsion at the posterior malleolus as well with posterior talar dislocation. Circumferential swelling and associated effusion. Electronically Signed   By: Kreg Shropshire M.D.   On: 12/04/2019 17:55   CT HEAD WO CONTRAST  Result Date: 12/04/2019 CLINICAL DATA:  Found on the floor. EXAM: CT  HEAD WITHOUT CONTRAST TECHNIQUE: Contiguous  axial images were obtained from the base of the skull through the vertex without intravenous contrast. COMPARISON:  None. FINDINGS: Brain: No evidence of acute infarction, hemorrhage, hydrocephalus, extra-axial collection or mass lesion/mass effect. Vascular: No hyperdense vessel or unexpected calcification. Skull: Normal. Negative for fracture or focal lesion. Sinuses/Orbits: There is mild right maxillary sinus mucosal thickening. Other: None. IMPRESSION: No acute intracranial abnormality. Electronically Signed   By: Aram Candela M.D.   On: 12/04/2019 19:10   CT CERVICAL SPINE WO CONTRAST  Result Date: 12/04/2019 CLINICAL DATA:  Found on the ground. EXAM: CT CERVICAL SPINE WITHOUT CONTRAST TECHNIQUE: Multidetector CT imaging of the cervical spine was performed without intravenous contrast. Multiplanar CT image reconstructions were also generated. COMPARISON:  None. FINDINGS: Alignment: Normal. Skull base and vertebrae: No acute fracture. No primary bone lesion or focal pathologic process. Soft tissues and spinal canal: No prevertebral fluid or swelling. No visible canal hematoma. Disc levels: Marked severity endplate sclerosis is seen at the level of C5-C6. Moderate to marked severity intervertebral disc space narrowing is seen at the level of C5-C6 and C6-C7. Normal bilateral multilevel facet joints are seen. Upper chest: Mild anterior right apical atelectasis is present. Other: None. IMPRESSION: 1. No acute osseous abnormality. 2. Moderate to marked severity degenerative disc disease at the level of C5-C6 and C6-C7. Electronically Signed   By: Aram Candela M.D.   On: 12/04/2019 19:13   CT Ankle Right Wo Contrast  Result Date: 12/04/2019 CLINICAL DATA:  Status post trauma. EXAM: CT OF THE RIGHT ANKLE WITHOUT CONTRAST TECHNIQUE: Multidetector CT imaging of the right ankle was performed according to the standard protocol. Multiplanar CT image reconstructions were also generated. COMPARISON:  None.  FINDINGS: Bones/Joint/Cartilage An acute, comminuted fracture deformity is seen involving the right medial malleolus and medial aspect of the right posterior malleolus. Additional acute fracture deformity is seen involving the distal shaft of the right fibula. This is just above the level of the right lateral malleolus. Posterior angulation of the right lateral malleolus is seen. Posterior dislocation of the right ankle is noted. Ligaments Suboptimally assessed by CT. Muscles and Tendons The muscles and tendons appear to be intact. Soft tissues Moderate severity diffuse soft tissue swelling, with associated inflammatory fat stranding and edema, is seen surrounding the previously noted fracture sites. This extends superiorly toward the distal right calf. IMPRESSION: 1. Acute, comminuted fracture deformity involving the right medial malleolus and medial aspect of the right posterior malleolus. 2. Additional acute fracture deformity involving the distal shaft of the right fibula. 3. Posterior dislocation of the right ankle. Electronically Signed   By: Aram Candela M.D.   On: 12/04/2019 19:18   DG Ankle Right Port  Result Date: 12/04/2019 CLINICAL DATA:  Post reduction films. EXAM: PORTABLE RIGHT ANKLE - 2 VIEW COMPARISON:  December 04, 2019 (5:24 p.m.) FINDINGS: The right ankle was imaged in a fiberglass cast with subsequently obscured osseous and soft tissue detail. Acute fractures of the right lateral malleolus, right medial malleolus and right posterior malleolus are again seen with gross anatomic alignment. There is no evidence of dislocation. Soft tissues are unremarkable. IMPRESSION: Trimalleolar fractures of the right ankle with gross anatomic alignment. Electronically Signed   By: Aram Candela M.D.   On: 12/04/2019 20:28    Procedures .Sedation  Date/Time: 12/04/2019 8:46 PM Performed by: Jacalyn Lefevre, MD Authorized by: Jacalyn Lefevre, MD   Consent:    Consent obtained:  Verbal   Consent  given by:  Patient Universal protocol:    Immediately prior to procedure a time out was called: yes   Pre-sedation assessment:    Time since last food or drink:  5   ASA classification: class 2 - patient with mild systemic disease     Mallampati score:  I - soft palate, uvula, fauces, pillars visible   Pre-sedation assessments completed and reviewed: airway patency, cardiovascular function, hydration status, mental status, nausea/vomiting, pain level, respiratory function and temperature   Procedure details (see MAR for exact dosages):    Preoxygenation:  Room air   Sedation:  Etomidate   Intended level of sedation: deep   Analgesia:  Morphine and fentanyl   Intra-procedure monitoring:  Blood pressure monitoring, cardiac monitor, continuous capnometry, continuous pulse oximetry, frequent LOC assessments and frequent vital sign checks   Intra-procedure events: none     Total Provider sedation time (minutes):  30 Post-procedure details:    Post-sedation assessment completed:  12/04/2019 8:47 PM   Attendance: Constant attendance by certified staff until patient recovered     Recovery: Patient returned to pre-procedure baseline     Post-sedation assessments completed and reviewed: airway patency, cardiovascular function, hydration status, mental status, nausea/vomiting, pain level, respiratory function and temperature     Patient is stable for discharge or admission: yes     Patient tolerance:  Tolerated well, no immediate complications Reduction of fracture  Date/Time: 12/04/2019 8:47 PM Performed by: Jacalyn Lefevre, MD Authorized by: Jacalyn Lefevre, MD  Preparation: Patient was prepped and draped in the usual sterile fashion. Local anesthesia used: no  Anesthesia: Local anesthesia used: no  Sedation: Patient sedated: yes Sedation type: moderate (conscious) sedation Sedatives: etomidate Analgesia: morphine Vitals: Vital signs were monitored during sedation.  Patient tolerance:  patient tolerated the procedure well with no immediate complications    (including critical care time)  Medications Ordered in ED Medications  sodium chloride 0.9 % bolus 1,000 mL (0 mLs Intravenous Stopped 12/04/19 1912)    And  0.9 %  sodium chloride infusion (has no administration in time range)  etomidate (AMIDATE) injection 10 mg (has no administration in time range)  midazolam (VERSED) injection 0.5 mg (has no administration in time range)  midazolam (VERSED) 2 MG/2ML injection (has no administration in time range)  midazolam (VERSED) injection (0.5 mg Intravenous Given 12/04/19 2006)  etomidate (AMIDATE) injection (7.5 mg Intravenous Given 12/04/19 1958)  fentaNYL (SUBLIMAZE) injection 50 mcg (50 mcg Intravenous Given 12/04/19 1649)  ondansetron (ZOFRAN) injection 4 mg (4 mg Intravenous Given 12/04/19 1651)  morphine 4 MG/ML injection 4 mg (4 mg Intravenous Given 12/04/19 1809)    ED Course  I have reviewed the triage vital signs and the nursing notes.  Pertinent labs & imaging results that were available during my care of the patient were reviewed by me and considered in my medical decision making (see chart for details).    MDM Rules/Calculators/A&P                          Pt said she'd been 4 months sober until today.  She said she became very stressed because her family is visiting for her father's 90th birthday.  Pt wants to stay sober and will go to [employer]  She is encouraged to do this.  CT ankle was done prior to reduction.  Pt d/w Dr. Susa Simmonds who will have his office call patient for an appointment.    Pt is stable  for d/c.  Return if worse.   Final Clinical Impression(s) / ED Diagnoses Final diagnoses:  Alcoholic intoxication without complication (HCC)  Fracture  Closed bimalleolar fracture of right ankle, initial encounter  Dislocation of right talus, initial encounter  Fall, initial encounter    Rx / DC Orders ED Discharge Orders         Ordered     HYDROcodone-acetaminophen (NORCO/VICODIN) 5-325 MG tablet  Every 4 hours PRN     Discontinue  Reprint     12/04/19 2043    ibuprofen (ADVIL) 600 MG tablet  Every 6 hours PRN     Discontinue  Reprint     12/04/19 2043           Jacalyn Lefevre, MD 12/04/19 2048

## 2019-12-04 NOTE — Progress Notes (Signed)
Orthopedic Tech Progress Note Patient Details:  [name] Mar 28, 1966 [id number]  Ortho Devices Type of Ortho Device: Crutches Ortho Device/Splint Location: LRE Ortho Device/Splint Interventions: Adjustment   Post Interventions Patient Tolerated: Well, Ambulated well Instructions Provided: Poper ambulation with device   [name] 12/04/2019, 9:16 PM

## 2019-12-04 NOTE — Progress Notes (Signed)
Orthopedic Tech Progress Note Patient Details:  [name] Aug 11, 1965 [id number]  Ortho Devices Type of Ortho Device: Short leg splint, Stirrup splint Ortho Device/Splint Location: LRE Ortho Device/Splint Interventions: Application, Ordered   Post Interventions Patient Tolerated: Well Instructions Provided: Care of device   [name] A [name] 12/04/2019, 8:15 PM

## 2019-12-04 NOTE — ED Triage Notes (Signed)
Pt arrives via EMS with complaints of fall. EMS reports pt was found on the floor at 1500. Pt was confused and alert to place and self only. Possible injury to right ankle. Pt in c-collar on arrival. Pt endorses ETOH on board.

## 2019-12-08 ENCOUNTER — Ambulatory Visit: Payer: BC Managed Care – PPO | Admitting: Psychiatry

## 2019-12-19 ENCOUNTER — Other Ambulatory Visit: Payer: Self-pay | Admitting: Psychiatry

## 2019-12-19 DIAGNOSIS — F419 Anxiety disorder, unspecified: Secondary | ICD-10-CM

## 2019-12-19 DIAGNOSIS — F5101 Primary insomnia: Secondary | ICD-10-CM

## 2019-12-21 NOTE — Telephone Encounter (Signed)
Patient still taking

## 2019-12-22 ENCOUNTER — Ambulatory Visit (INDEPENDENT_AMBULATORY_CARE_PROVIDER_SITE_OTHER): Payer: BC Managed Care – PPO | Admitting: Psychiatry

## 2019-12-22 DIAGNOSIS — F411 Generalized anxiety disorder: Secondary | ICD-10-CM

## 2019-12-22 NOTE — Progress Notes (Signed)
[facility] Counselor/Therapist Progress Note  Patient ID: [name], MRN: [id number],    Date: 12/22/2019  Time Spent: 60 minutes   12:00noon to 1:00pm  Virtual Visit  Note via MyChart Connected with patient by a video enabled telemedicine/telehealth application or telephone, with their informed consent, and verified patient privacy and that I am speaking with the correct person using two identifiers. I discussed the limitations, risks, security and privacy concerns of performing psychotherapy and management service by telephone and the availability of in person appointments. I also discussed with the patient that there may be a patient responsible charge related to this service. The patient expressed understanding and agreed to proceed. I discussed the treatment planning with the patient. The patient was provided an opportunity to ask questions and all were answered. The patient agreed with the plan and demonstrated an understanding of the instructions. The patient was advised to call  our office if  symptoms worsen or feel they are in a crisis state and need immediate contact.   Therapist Location: [facility] Patient Location: home   Treatment Type: Individual Therapy  Reported Symptoms: anxiety, depression, frustration  Mental Status Exam:  Appearance:   Casual     Behavior:  Appropriate and Sharing  Motor:  Normal  Speech/Language:   Clear and Coherent  Affect:  anxious  Mood:  anxious  Thought process:  normal  Thought content:    WNL  Sensory/Perceptual disturbances:    WNL  Orientation:  oriented to person, place, time/date, situation, day of week, month of year and year  Attention:  Good  Concentration:  Fair  Memory:  WNL  Fund of knowledge:   Good  Insight:    Good and Fair  Judgment:   Good and Fair  Impulse Control:  Fair   Risk Assessment: Danger to Self:  No Self-injurious Behavior: No Danger to Others: No Duty to Warn:no Physical  Aggression / Violence:No  Access to Firearms a concern: No  Gang Involvement:Yes   Subjective: Patient today reports recent broken ankle and opted for telehealth today. Family and neighbors have been very helpful to her during this time.  Anxious, depressed, frustrated.  Interventions: Solution-Oriented/Positive Psychology and Ego-Supportive  Diagnosis:   ICD-10-CM   1. Generalized anxiety disorder  F41.1      Plan of Care: Patient not signing tx plan on computer screen due to COVID-19.  Treatment Goals: Goals remain on tx plan as patient works on strategies to meet her goals. Progress will be noted each session in "Progress" section of Plan.  Long term goals: Reduce overall level, frequency, and intensity ofgrief andanxiety so that daily functioning is not impaired.  Short term goals: Increase understanding of beliefs and messages that lead to worry and anxiety, and how these relate to her grief process.  Strategy: Explore cognitive messages that lead to anxiety responses and retrain in adaptive cognitions.(especially as these relate to her grief issues)  Progress: Patient today opted for telehealth due to having a broken ankle and falling.Had relapsed and drank which led to her fall and breaking her ankle. Processed this some and she quickly diverted to "I know I need to find better ways to deal with my anxiety"."Doing a lot of thinking when I'm alone" but have had daily visitors checking in on me.  States that her sons and neighbors don't know alcohol was involved" and that only 2 people, sister/sister-in-law, know of her drinking agaiin and they cleared the house of alcohol  per patient report. Processed the recent relapse and results of her relapse with patient, as she reports feeling embarrassed but glad that very few people reportedly know about her alcohol relapse leading to her fall and breaking of ankle. She does say that she's determined to "stay stopped drinking".  She states that this most recent relapse, she had only drank that one time when she ended up falling and breaking ankle. States she knows she should have called her sponsor or another AA friend or could have attended an AA meeting, but didn't. Right now, she is confined to her house for the most part and states there is no alcohol present in the home.  She does commit to staying in contact with some of her family and her AA sponsor and at least a couple other friends during this time of being more confined at home.  She commits to also no alcohol usage.  Encouraged positive self-care, emotionally and physically, especially and using healthy, positive self talk.  Goal review and progress/challenges noted with patient.  Next appt within 3 weeks.    Mathis Fare, LCSW

## 2020-01-05 ENCOUNTER — Ambulatory Visit: Payer: BC Managed Care – PPO | Admitting: Psychiatry

## 2020-01-05 NOTE — Progress Notes (Unsigned)
      [facility] Counselor/Therapist Progress Note  Patient ID: ESMAY AMSPACHER, MRN: [id number],    Date: 01/05/2020  Time Spent: ***   Treatment Type: {CHL AMB THERAPY [phone]}  Reported Symptoms: ***  Mental Status Exam:  Appearance:   {PSY:22683}     Behavior:  {PSY:21022743}  Motor:  {PSY:22302}  Speech/Language:   {PSY:22685}  Affect:  {PSY:22687}  Mood:  {PSY:31886}  Thought process:  {PSY:31888}  Thought content:    {PSY:8706132476}  Sensory/Perceptual disturbances:    {[phone]}  Orientation:  {PSY:30297}  Attention:  {PSY:22877}  Concentration:  {[phone]}  Memory:  {[phone]}  Fund of knowledge:   {[phone]}  Insight:    {[phone]}  Judgment:   {[phone]}  Impulse Control:  {[phone]}   Risk Assessment: Danger to Self:  {PSY:22692} Self-injurious Behavior: {PSY:22692} Danger to Others: {PSY:22692} Duty to Warn:{PSY:311194} Physical Aggression / Violence:{PSY:21197} Access to Firearms a concern: {PSY:21197} Gang Involvement:{PSY:21197}  Subjective: ***   Interventions: {PSY:3252682967}  Diagnosis:No diagnosis found.  Plan: ***  Mathis Fare, LCSW

## 2020-01-19 ENCOUNTER — Ambulatory Visit: Payer: BC Managed Care – PPO | Admitting: Psychiatry

## 2020-02-21 ENCOUNTER — Other Ambulatory Visit: Payer: Self-pay | Admitting: Psychiatry

## 2020-02-22 NOTE — Telephone Encounter (Signed)
Last apt 10/2019  Medication not listed?

## 2020-03-14 ENCOUNTER — Other Ambulatory Visit: Payer: Self-pay | Admitting: Psychiatry

## 2020-03-14 DIAGNOSIS — F1021 Alcohol dependence, in remission: Secondary | ICD-10-CM

## 2020-03-14 DIAGNOSIS — F419 Anxiety disorder, unspecified: Secondary | ICD-10-CM

## 2020-03-17 NOTE — Telephone Encounter (Signed)
Last apt 07/22 due back 3 months

## 2020-04-04 ENCOUNTER — Encounter: Payer: Self-pay | Admitting: Psychiatry

## 2020-04-04 ENCOUNTER — Other Ambulatory Visit: Payer: Self-pay

## 2020-04-04 ENCOUNTER — Ambulatory Visit (INDEPENDENT_AMBULATORY_CARE_PROVIDER_SITE_OTHER): Payer: BC Managed Care – PPO | Admitting: Psychiatry

## 2020-04-04 DIAGNOSIS — F419 Anxiety disorder, unspecified: Secondary | ICD-10-CM

## 2020-04-04 DIAGNOSIS — F5101 Primary insomnia: Secondary | ICD-10-CM | POA: Diagnosis not present

## 2020-04-04 DIAGNOSIS — F3289 Other specified depressive episodes: Secondary | ICD-10-CM

## 2020-04-04 MED ORDER — DULOXETINE HCL 30 MG PO CPEP: ORAL_CAPSULE | ORAL | 0 refills | Status: DC

## 2020-04-04 MED ORDER — CLONIDINE HCL 0.1 MG PO TABS: 0.1000 mg | ORAL_TABLET | Freq: Every day | ORAL | 1 refills | Status: DC

## 2020-04-04 MED ORDER — DOXEPIN HCL 10 MG PO CAPS: ORAL_CAPSULE | ORAL | 2 refills | Status: DC

## 2020-04-04 MED ORDER — REXULTI 3 MG PO TABS: 1.0000 | ORAL_TABLET | Freq: Every day | ORAL | 1 refills | Status: DC

## 2020-04-04 MED ORDER — TRAZODONE HCL 100 MG PO TABS: 200.0000 mg | ORAL_TABLET | Freq: Every day | ORAL | 2 refills | Status: DC

## 2020-04-04 MED ORDER — DULOXETINE HCL 60 MG PO CPEP: 60.0000 mg | ORAL_CAPSULE | Freq: Every day | ORAL | 1 refills | Status: DC

## 2020-04-04 MED ORDER — BUPROPION HCL ER (XL) 150 MG PO TB24: 150.0000 mg | ORAL_TABLET | Freq: Every day | ORAL | 2 refills | Status: DC

## 2020-04-04 NOTE — Progress Notes (Signed)
ED RAYSON [id number] 1966/01/11 54 y.o.  Subjective:   Patient ID:  [name] is a 54 y.o. (DOB July 19, 1965) female.  Chief Complaint:  Chief Complaint  Patient presents with  . Alcohol Problem  . Anxiety  . Depression    HPI [name] presents to the office today for follow-up of anxiety and depression. She is accompanied by her friend, [name]. She reports that she has been drinking intermittently since last visit. Last drink Saturday after Thanksgiving.  Longest period of sobriety has been a couple of months since last visit. Has been going to meetings and has a new sponsor.   She reports, "I'm having a ton of anxiety and depression." She reports, "Don't think my meds are working all that well." She reports that she has been feeling "shaky... almost like I am going to pass out." Denies any full blown panic attacks. "This is the most anxious I have felt in quite awhile." She reports that she has had some generalized anxiety.   She reports that she has been having significant depression. She reports that she has had persistent depressed mood. Denies irritability. Has had decreased appetite. She reports that Trazodone, Doxepin, and Clonidine are helpful for sleep. Sleeping 8-9 hours a night. Energy has been "ok." Motivation has been low. She reports that her concentration is ok and friend reports that she has been "scattered." Has been able to manage finances. Diminished interest in things. Denies SI.   Friend reports that children have been "upset with her." Children have said that they do not want to see her again unless she has been sober for at least a year. Her mother-in-law no longer wants contact with her. Friend reports that family is concerned about medication compliance.   She reports that she stopped Sertaline and Rexulti about a month ago.   Has moved into an apartment due to finances. Sold townhouse.   Sister-in-law, [name], is supportive. [name] is concerned about  [name] living alone. Has apt to start seeing [name], [name].  She reports that Rexulti and Wellbutrin XL have been hepful. Feels that Sertraline is no longer as effective.   Past medication trials: Lexapro-does not recall significant response Sertraline Mirtazapine Wellbutrin Rexulti Abilify Topamax Trileptal Gabapentin Seroquel Hydroxyzine-ineffective BuSpar Trazodone Doxepin- Reports this was helpful when she was in rehab Clonidine- Helpful for anxiety Baclofen- did not notice any change in cravings   AIMS     Office Visit from 07/24/2019 in [facility]  AIMS Total Score 0    PHQ2-9     Office Visit from 10/21/2019 in [facility] Office Visit from 06/26/2019 in [facility] Office Visit from 02/06/2013 in [facility]  PHQ-2 Total Score 2 3 0  PHQ-9 Total Score 5 8 --       Review of Systems:  Review of Systems  Gastrointestinal: Positive for nausea.  Musculoskeletal: Negative for gait problem.  Neurological: Negative for tremors.  Psychiatric/Behavioral:       Please refer to HPI    Medications: I have reviewed the patient's current medications.  Current Outpatient Medications  Medication Sig Dispense Refill  . albuterol (VENTOLIN HFA) 108 (90 Base) MCG/ACT inhaler Inhale 1 puff into the lungs every 6 (six) hours as needed for wheezing or shortness of breath.     Marland Kitchen ibuprofen (ADVIL) 600 MG tablet Take 1 tablet (600 mg total) by mouth every 6 (six) hours as needed. 30 tablet 0  . Multiple  Vitamins-Minerals (MULTIVITAMIN WITH MINERALS) tablet Take 1 tablet by mouth daily.    . naproxen sodium (ALEVE) 220 MG tablet Take 220 mg by mouth daily as needed (For pain).     . rosuvastatin (CRESTOR) 20 MG tablet Take 20 mg by mouth every evening.     . Brexpiprazole (REXULTI) 3 MG TABS Take 1 tablet (3 mg total) by mouth daily. 30 tablet 1  . buPROPion (WELLBUTRIN XL) 150 MG 24 hr tablet Take 1  tablet (150 mg total) by mouth daily. 30 tablet 2  . cloNIDine (CATAPRES) 0.1 MG tablet Take 1 tablet (0.1 mg total) by mouth at bedtime. 30 tablet 1  . doxepin (SINEQUAN) 10 MG capsule TAKE 1-2 TABLETS BY MOUTH AT BEDTIME AS NEEDED 60 capsule 2  . DULoxetine (CYMBALTA) 30 MG capsule Take 1 capsule po q am x 1 week, then 2 capsules po q am 30 capsule 0  . DULoxetine (CYMBALTA) 60 MG capsule Take 1 capsule (60 mg total) by mouth daily. 30 capsule 1  . ergocalciferol (VITAMIN D2) 1.25 MG (50000 UT) capsule Take 50,000 Units by mouth once a week. Tuesdays (Patient not taking: Reported on 04/04/2020)    . traZODone (DESYREL) 100 MG tablet Take 2 tablets (200 mg total) by mouth at bedtime. 60 tablet 2   No current facility-administered medications for this visit.    Medication Side Effects: None  Allergies:  Allergies  Allergen Reactions  . Erythromycin     Stomach  cramps    Past Medical History:  Diagnosis Date  . Alcohol dependence (HCC)   . Allergy   . Depression   . Elevated cholesterol   . Headache   . HTN (hypertension)   . Vitamin D deficiency     Family History  Problem Relation Age of Onset  . Cancer Mother [phone]       breast  . Depression Mother   . Heart disease Mother   . Macular degeneration Mother   . Diabetes Maternal Grandmother   . Heart disease Maternal Grandmother   . Stroke Maternal Grandmother   . Alcohol abuse Sister   . Depression Sister   . Alcohol abuse Paternal Grandmother   . Cancer Father   . COPD Father   . Depression Brother   . Early death Neg Hx     Social History   Socioeconomic History  . Marital status: Married    Spouse name: Not on file  . Number of children: Not on file  . Years of education: Not on file  . Highest education level: Not on file  Occupational History  . Not on file  Tobacco Use  . Smoking status: Never Smoker  . Smokeless tobacco: Never Used  Substance and Sexual Activity  . Alcohol use: Not Currently     Alcohol/week: 0.0 standard drinks  . Drug use: No  . Sexual activity: Yes    Birth control/protection: Surgical  Other Topics Concern  . Not on file  Social History Narrative  . Not on file   Social Determinants of Health   Financial Resource Strain:   . Difficulty of Paying Living Expenses: Not on file  Food Insecurity:   . Worried About Programme researcher, broadcasting/film/videounning Out of Food in the Last Year: Not on file  . Ran Out of Food in the Last Year: Not on file  Transportation Needs:   . Lack of Transportation (Medical): Not on file  . Lack of Transportation (Non-Medical): Not on file  Physical Activity:   .  Days of Exercise per Week: Not on file  . Minutes of Exercise per Session: Not on file  Stress:   . Feeling of Stress : Not on file  Social Connections:   . Frequency of Communication with Friends and Family: Not on file  . Frequency of Social Gatherings with Friends and Family: Not on file  . Attends Religious Services: Not on file  . Active Member of Clubs or Organizations: Not on file  . Attends Banker Meetings: Not on file  . Marital Status: Not on file  Intimate Partner Violence:   . Fear of Current or Ex-Partner: Not on file  . Emotionally Abused: Not on file  . Physically Abused: Not on file  . Sexually Abused: Not on file    Past Medical History, Surgical history, Social history, and Family history were reviewed and updated as appropriate.   Please see review of systems for further details on the patient's review from today.   Objective:   Physical Exam:  BP (!) 153/93   Pulse (!) 102   Physical Exam Constitutional:      General: She is not in acute distress. Musculoskeletal:        General: No deformity.  Neurological:     Mental Status: She is alert and oriented to person, place, and time.     Coordination: Coordination normal.  Psychiatric:        Attention and Perception: Attention and perception normal. She does not perceive auditory or visual  hallucinations.        Mood and Affect: Mood is anxious and depressed. Affect is tearful. Affect is not labile, blunt, angry or inappropriate.        Speech: Speech normal.        Behavior: Behavior normal.        Thought Content: Thought content normal. Thought content is not paranoid or delusional. Thought content does not include homicidal or suicidal ideation. Thought content does not include homicidal or suicidal plan.        Cognition and Memory: Cognition and memory normal.        Judgment: Judgment normal.     Comments: Insight intact     Lab Review:     Component Value Date/Time   NA 139 12/04/2019 1644   K 3.4 (L) 12/04/2019 1644   CL 105 12/04/2019 1644   CO2 22 12/04/2019 1644   GLUCOSE 116 (H) 12/04/2019 1644   BUN 13 12/04/2019 1644   CREATININE 0.87 12/04/2019 1644   CALCIUM 8.6 (L) 12/04/2019 1644   PROT 6.9 12/04/2019 1644   ALBUMIN 3.7 12/04/2019 1644   AST 37 12/04/2019 1644   ALT 27 12/04/2019 1644   ALKPHOS 67 12/04/2019 1644   BILITOT 0.8 12/04/2019 1644   GFRNONAA >60 12/04/2019 1644   GFRAA >60 12/04/2019 1644       Component Value Date/Time   WBC 12.1 (H) 12/04/2019 1644   RBC 4.24 12/04/2019 1644   HGB 13.4 12/04/2019 1644   HCT 39.2 12/04/2019 1644   PLT 265 12/04/2019 1644   MCV 92.5 12/04/2019 1644   MCH 31.6 12/04/2019 1644   MCHC 34.2 12/04/2019 1644   RDW 12.4 12/04/2019 1644   LYMPHSABS 2.4 12/04/2019 1644   MONOABS 0.5 12/04/2019 1644   EOSABS 0.0 12/04/2019 1644   BASOSABS 0.0 12/04/2019 1644    No results found for: POCLITH, LITHIUM   No results found for: PHENYTOIN, PHENOBARB, VALPROATE, CBMZ   .res Assessment: Plan:  Discussed possible treatment options for anxiety and depression since patient feels that sertraline is no longer effective for mood and anxiety.  Discussed that she has been on several SSRIs with limited improvement and therefore may be more likely to respond to an SNRI, such as Cymbalta.  Discussed potential  benefits, risks, and side effects of Cymbalta.  Patient agrees to trial of Cymbalta.  Will start Cymbalta 30 mg daily for 1 week, then increase to 60 mg daily. Patient reports that she would also like to restart Rexulti since this has been effective for her in the past.  Patient provided with Rexulti samples to use start Rexulti 1 mg for 4 days, then increased to milligrams for 2-1/2 weeks, then increase to 3 mg daily since this dose has been most effective for her mood and anxiety. Continue doxepin, trazodone, and clonidine for insomnia. Agree with plan to start therapy with [name], LC [facility].  Agree with plan to continue AA and working with a sponsor. Patient to follow-up in 1 to 2 months or sooner if clinically indicated. Patient advised to contact office with any questions, adverse effects, or acute worsening in signs and symptoms.  Chaley was seen today for alcohol problem, anxiety and depression.  Diagnoses and all orders for this visit:  Other depression -     DULoxetine (CYMBALTA) 30 MG capsule; Take 1 capsule po q am x 1 week, then 2 capsules po q am -     DULoxetine (CYMBALTA) 60 MG capsule; Take 1 capsule (60 mg total) by mouth daily. -     buPROPion (WELLBUTRIN XL) 150 MG 24 hr tablet; Take 1 tablet (150 mg total) by mouth daily. -     Brexpiprazole (REXULTI) 3 MG TABS; Take 1 tablet (3 mg total) by mouth daily.  Primary insomnia -     doxepin (SINEQUAN) 10 MG capsule; TAKE 1-2 TABLETS BY MOUTH AT BEDTIME AS NEEDED -     traZODone (DESYREL) 100 MG tablet; Take 2 tablets (200 mg total) by mouth at bedtime. -     cloNIDine (CATAPRES) 0.1 MG tablet; Take 1 tablet (0.1 mg total) by mouth at bedtime.  Anxiety -     DULoxetine (CYMBALTA) 30 MG capsule; Take 1 capsule po q am x 1 week, then 2 capsules po q am -     DULoxetine (CYMBALTA) 60 MG capsule; Take 1 capsule (60 mg total) by mouth daily. -     Brexpiprazole (REXULTI) 3 MG TABS; Take 1 tablet (3 mg total) by mouth daily. -      cloNIDine (CATAPRES) 0.1 MG tablet; Take 1 tablet (0.1 mg total) by mouth at bedtime.     Please see After Visit Summary for patient specific instructions.  Future Appointments  Date Time Provider Department [facility]  05/23/2020 10:30 AM Corie Chiquito, PMHNP [facility] None    No orders of the defined types were placed in this encounter.   -------------------------------

## 2020-04-10 ENCOUNTER — Other Ambulatory Visit: Payer: Self-pay | Admitting: Psychiatry

## 2020-04-10 DIAGNOSIS — F5101 Primary insomnia: Secondary | ICD-10-CM

## 2020-04-11 ENCOUNTER — Other Ambulatory Visit: Payer: Self-pay | Admitting: Psychiatry

## 2020-04-11 DIAGNOSIS — F3289 Other specified depressive episodes: Secondary | ICD-10-CM

## 2020-04-11 DIAGNOSIS — F419 Anxiety disorder, unspecified: Secondary | ICD-10-CM

## 2020-04-19 ENCOUNTER — Other Ambulatory Visit: Payer: Self-pay | Admitting: Psychiatry

## 2020-04-19 DIAGNOSIS — F3289 Other specified depressive episodes: Secondary | ICD-10-CM

## 2020-04-19 DIAGNOSIS — F5101 Primary insomnia: Secondary | ICD-10-CM

## 2020-04-19 DIAGNOSIS — F419 Anxiety disorder, unspecified: Secondary | ICD-10-CM

## 2020-04-26 ENCOUNTER — Other Ambulatory Visit: Payer: Self-pay | Admitting: Psychiatry

## 2020-04-26 DIAGNOSIS — F5101 Primary insomnia: Secondary | ICD-10-CM

## 2020-04-26 DIAGNOSIS — F419 Anxiety disorder, unspecified: Secondary | ICD-10-CM

## 2020-05-10 ENCOUNTER — Other Ambulatory Visit: Payer: Self-pay | Admitting: Psychiatry

## 2020-05-10 DIAGNOSIS — F5101 Primary insomnia: Secondary | ICD-10-CM

## 2020-05-17 ENCOUNTER — Telehealth: Payer: Self-pay

## 2020-05-17 NOTE — Telephone Encounter (Signed)
Prior Authorization submitted and approved for REXULTI 3 MG with [employer] [facility] [employer] effective 05/09/2020-05/08/2021. PA# [id number]

## 2020-05-20 ENCOUNTER — Other Ambulatory Visit: Payer: Self-pay | Admitting: Psychiatry

## 2020-05-20 DIAGNOSIS — F5101 Primary insomnia: Secondary | ICD-10-CM

## 2020-05-20 DIAGNOSIS — F419 Anxiety disorder, unspecified: Secondary | ICD-10-CM

## 2020-05-23 ENCOUNTER — Ambulatory Visit: Payer: BC Managed Care – PPO | Admitting: Psychiatry

## 2020-06-15 ENCOUNTER — Other Ambulatory Visit: Payer: Self-pay | Admitting: Psychiatry

## 2020-06-15 DIAGNOSIS — F419 Anxiety disorder, unspecified: Secondary | ICD-10-CM

## 2020-06-15 DIAGNOSIS — F5101 Primary insomnia: Secondary | ICD-10-CM

## 2020-06-16 NOTE — Telephone Encounter (Signed)
Called Pt, LM on VM need to schedule follow up.

## 2020-07-11 ENCOUNTER — Other Ambulatory Visit: Payer: Self-pay | Admitting: Psychiatry

## 2020-07-11 DIAGNOSIS — F419 Anxiety disorder, unspecified: Secondary | ICD-10-CM

## 2020-07-11 DIAGNOSIS — F5101 Primary insomnia: Secondary | ICD-10-CM

## 2020-08-09 ENCOUNTER — Other Ambulatory Visit: Payer: Self-pay | Admitting: Psychiatry

## 2020-08-09 DIAGNOSIS — F5101 Primary insomnia: Secondary | ICD-10-CM

## 2020-08-09 DIAGNOSIS — F419 Anxiety disorder, unspecified: Secondary | ICD-10-CM

## 2020-09-15 ENCOUNTER — Encounter: Payer: Self-pay | Admitting: Psychiatry

## 2020-09-15 ENCOUNTER — Ambulatory Visit (INDEPENDENT_AMBULATORY_CARE_PROVIDER_SITE_OTHER): Payer: 59 | Admitting: Psychiatry

## 2020-09-15 ENCOUNTER — Other Ambulatory Visit: Payer: Self-pay

## 2020-09-15 DIAGNOSIS — F3289 Other specified depressive episodes: Secondary | ICD-10-CM | POA: Diagnosis not present

## 2020-09-15 DIAGNOSIS — F5101 Primary insomnia: Secondary | ICD-10-CM

## 2020-09-15 DIAGNOSIS — F419 Anxiety disorder, unspecified: Secondary | ICD-10-CM | POA: Diagnosis not present

## 2020-09-15 MED ORDER — CLONIDINE HCL 0.1 MG PO TABS: ORAL_TABLET | ORAL | 1 refills | Status: DC

## 2020-09-15 MED ORDER — DULOXETINE HCL 30 MG PO CPEP: ORAL_CAPSULE | ORAL | 1 refills | Status: DC

## 2020-09-15 MED ORDER — BREXPIPRAZOLE 2 MG PO TABS: 2.0000 mg | ORAL_TABLET | Freq: Every day | ORAL | 1 refills | Status: DC

## 2020-09-15 MED ORDER — PROPRANOLOL HCL 10 MG PO TABS: ORAL_TABLET | ORAL | 1 refills | Status: DC

## 2020-09-15 MED ORDER — QUETIAPINE FUMARATE 25 MG PO TABS: ORAL_TABLET | ORAL | 1 refills | Status: DC

## 2020-09-15 MED ORDER — DULOXETINE HCL 60 MG PO CPEP: 60.0000 mg | ORAL_CAPSULE | Freq: Every day | ORAL | 1 refills | Status: DC

## 2020-09-15 MED ORDER — BUPROPION HCL ER (XL) 150 MG PO TB24
150.0000 mg | ORAL_TABLET | Freq: Every day | ORAL | 1 refills | Status: DC
Start: 2020-09-15 — End: 2020-11-04

## 2020-09-15 NOTE — Progress Notes (Signed)
   09/15/20 1548  Facial and Oral Movements  Muscles of Facial Expression 0  Lips and Perioral Area 0  Jaw 0  Tongue 0  Extremity Movements  Upper (arms, wrists, hands, fingers) 0  Lower (legs, knees, ankles, toes) 0  Trunk Movements  Neck, shoulders, hips 0  Overall Severity  Severity of abnormal movements (highest score from questions above) 0  Incapacitation due to abnormal movements 0  [name] awareness of abnormal movements (rate only patient's report) 0  AIMS Total Score  AIMS Total Score 0
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
## 2020-10-07 ENCOUNTER — Other Ambulatory Visit: Payer: Self-pay | Admitting: Psychiatry

## 2020-10-07 DIAGNOSIS — F5101 Primary insomnia: Secondary | ICD-10-CM

## 2020-10-07 DIAGNOSIS — F419 Anxiety disorder, unspecified: Secondary | ICD-10-CM

## 2020-10-11 ENCOUNTER — Other Ambulatory Visit: Payer: Self-pay | Admitting: Psychiatry

## 2020-10-11 DIAGNOSIS — F5101 Primary insomnia: Secondary | ICD-10-CM

## 2020-10-12 ENCOUNTER — Other Ambulatory Visit: Payer: Self-pay | Admitting: Psychiatry

## 2020-10-12 DIAGNOSIS — F419 Anxiety disorder, unspecified: Secondary | ICD-10-CM

## 2020-11-03 ENCOUNTER — Ambulatory Visit: Payer: 59 | Admitting: Psychiatry

## 2020-11-04 ENCOUNTER — Encounter: Payer: Self-pay | Admitting: Psychiatry

## 2020-11-04 ENCOUNTER — Ambulatory Visit (INDEPENDENT_AMBULATORY_CARE_PROVIDER_SITE_OTHER): Payer: 59 | Admitting: Psychiatry

## 2020-11-04 ENCOUNTER — Other Ambulatory Visit: Payer: Self-pay

## 2020-11-04 DIAGNOSIS — F419 Anxiety disorder, unspecified: Secondary | ICD-10-CM

## 2020-11-04 DIAGNOSIS — F5101 Primary insomnia: Secondary | ICD-10-CM | POA: Diagnosis not present

## 2020-11-04 DIAGNOSIS — F3289 Other specified depressive episodes: Secondary | ICD-10-CM | POA: Diagnosis not present

## 2020-11-04 MED ORDER — DULOXETINE HCL 60 MG PO CPEP: 60.0000 mg | ORAL_CAPSULE | Freq: Every day | ORAL | 0 refills | Status: DC

## 2020-11-04 MED ORDER — PROPRANOLOL HCL 10 MG PO TABS: ORAL_TABLET | ORAL | 1 refills | Status: DC

## 2020-11-04 MED ORDER — BUPROPION HCL ER (XL) 150 MG PO TB24: 150.0000 mg | ORAL_TABLET | Freq: Every day | ORAL | 1 refills | Status: DC

## 2020-11-04 MED ORDER — BREXPIPRAZOLE 2 MG PO TABS: 2.0000 mg | ORAL_TABLET | Freq: Every day | ORAL | 1 refills | Status: DC

## 2020-11-04 MED ORDER — CLONIDINE HCL 0.1 MG PO TABS: ORAL_TABLET | ORAL | 2 refills | Status: DC

## 2020-11-04 MED ORDER — TRAZODONE HCL 100 MG PO TABS: ORAL_TABLET | ORAL | 2 refills | Status: DC

## 2020-11-04 NOTE — Progress Notes (Signed)
[name] [id number] [birth year] 55 y.o.  Subjective:   Patient ID:  [name] is a 55 y.o. (DOB 17-Dec-1965) female.  Chief Complaint:  Chief Complaint  Patient presents with   Insomnia   Anxiety   Depression     HPI [name] presents to the office today for follow-up of insomnia, depression, and anxiety. "Much better since last time." Mood has been "ok... I feel like I am always depressed, but not as bad as the past."  She report sthat she continues to have difficulty falling asleep. She reports that it can take hours to fall asleep. Estimates sleeping about 7 hours a night and reports that she needs more sleep. She reports that she does not see a significant improvement in Seroquel compared to Trazodone.   She reports that her anxiety has been less. She reports that she is occasionally experiencing increased anxiety during the night. She reports that her anxiety is "not half as bad as it was" compared to last visit. Denies panic attacks. Energy and motivation have been ok. She reports appetite has been unchanged. Concentration has been ok. Denies SI.   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 She has a date tonight.   Continues to see Tyler Aas, LCAS for therapy every 2 weeks. Continues to go to AA almost daily and talks with sponsor almost daily.   She reports that her oldest brother and his wife live in the [place] area. She reports that he insists on weekly Zoom meetings with all the siblings. She has another brother and his wife that live locally. She reports that she and her sister have been having disagreements. She reports that her parents have been supportive. Youngest son is in [place] and she talks with him regularly. Has not talked with oldest son recently.   Past medication trials: Lexapro-does not recall significant  response Sertraline Cymbalta Mirtazapine Wellbutrin Rexulti Abilify Topamax Trileptal Gabapentin Seroquel Hydroxyzine-ineffective for anxiety BuSpar Trazodone-Ineffective Doxepin- Reports this was helpful when she was in rehab Clonidine- Helpful for anxiety Baclofen- did not notice any change in cravings Propranolol- some improvment    AIMS    Flowsheet Row Office Visit from 09/15/2020 in [facility] Office Visit from 07/24/2019 in [facility]  AIMS Total Score 0 0      PHQ2-9    Flowsheet Row Office Visit from 10/21/2019 in [facility] Office Visit from 06/26/2019 in [facility] Office Visit from 02/06/2013 in [facility]  PHQ-2 Total Score 2 3 0  PHQ-9 Total Score 5 8 --        Review of Systems:  Review of Systems  Constitutional:        She reports that she has been less "sweaty."   Musculoskeletal:  Negative for gait problem.  Skin:  Negative for rash.  Neurological:  Negative for tremors.  Psychiatric/Behavioral:         Please refer to HPI   Medications: I have reviewed the patient's current medications.  Current Outpatient Medications  Medication Sig Dispense Refill   albuterol (VENTOLIN HFA) 108 (90 Base) MCG/ACT inhaler Inhale 1 puff into the lungs every 6 (six) hours as needed for wheezing or shortness of breath.      brexpiprazole (REXULTI) 2 MG TABS tablet Take 1 tablet (2 mg total) by mouth daily. 30 tablet 1  buPROPion (WELLBUTRIN XL) 150 MG 24 hr tablet Take 1 tablet (150 mg total) by mouth daily. 30 tablet 1   cloNIDine (CATAPRES) 0.1 MG tablet TAKE 1 TABLET BY MOUTH EVERYDAY AT BEDTIME 30 tablet 2   DULoxetine (CYMBALTA) 60 MG capsule Take 1 capsule (60 mg total) by mouth daily. 90 capsule 0   ergocalciferol (VITAMIN D2) 1.25 MG (50000 UT) capsule Take 50,000 Units by mouth once a week. Tuesdays (Patient not taking: No sig reported)     ibuprofen (ADVIL)  600 MG tablet Take 1 tablet (600 mg total) by mouth every 6 (six) hours as needed. 30 tablet 0   naproxen sodium (ALEVE) 220 MG tablet Take 220 mg by mouth daily as needed (For pain).      propranolol (INDERAL) 10 MG tablet TAKE 1 TABLET BY MOUTH TWICE A DAY AS NEEDED FOR ANXIETY 60 tablet 1   rosuvastatin (CRESTOR) 20 MG tablet Take 20 mg by mouth every evening.  (Patient not taking: Reported on 09/15/2020)     traZODone (DESYREL) 100 MG tablet Take 1-2 tablets at bedtime for insomnia 60 tablet 2   No current facility-administered medications for this visit.    Medication Side Effects: None  Allergies:  Allergies  Allergen Reactions   Erythromycin     Stomach  cramps    Past Medical History:  Diagnosis Date   Alcohol dependence (HCC)    Allergy    Depression    Elevated cholesterol    Headache    HTN (hypertension)    Vitamin D deficiency     Past Medical History, Surgical history, Social history, and Family history were reviewed and updated as appropriate.   Please see review of systems for further details on the patient's review from today.   Objective:   Physical Exam:  BP 127/84   Pulse (!) 104   Physical Exam Constitutional:      General: She is not in acute distress. Musculoskeletal:        General: No deformity.  Neurological:     Mental Status: She is alert and oriented to person, place, and time.     Coordination: Coordination normal.  Psychiatric:        Attention and Perception: Attention and perception normal. She does not perceive auditory or visual hallucinations.        Mood and Affect: Affect is not labile, blunt, angry or inappropriate.        Speech: Speech normal.        Behavior: Behavior normal.        Thought Content: Thought content normal. Thought content is not paranoid or delusional. Thought content does not include homicidal or suicidal ideation. Thought content does not include homicidal or suicidal plan.        Cognition and Memory:  Cognition and memory normal.        Judgment: Judgment normal.     Comments: Insight intact Mood is appropriate to content. Affect is congruent.     Lab Review:     Component Value Date/Time   NA 139 12/04/2019 1644   K 3.4 (L) 12/04/2019 1644   CL 105 12/04/2019 1644   CO2 22 12/04/2019 1644   GLUCOSE 116 (H) 12/04/2019 1644   BUN 13 12/04/2019 1644   CREATININE 0.87 12/04/2019 1644   CALCIUM 8.6 (L) 12/04/2019 1644   PROT 6.9 12/04/2019 1644   ALBUMIN 3.7 12/04/2019 1644   AST 37 12/04/2019 1644   ALT 27 12/04/2019 1644  ALKPHOS 67 12/04/2019 1644   BILITOT 0.8 12/04/2019 1644   GFRNONAA >60 12/04/2019 1644   GFRAA >60 12/04/2019 1644       Component Value Date/Time   WBC 12.1 (H) 12/04/2019 1644   RBC 4.24 12/04/2019 1644   HGB 13.4 12/04/2019 1644   HCT 39.2 12/04/2019 1644   PLT 265 12/04/2019 1644   MCV 92.5 12/04/2019 1644   MCH 31.6 12/04/2019 1644   MCHC 34.2 12/04/2019 1644   RDW 12.4 12/04/2019 1644   LYMPHSABS 2.4 12/04/2019 1644   MONOABS 0.5 12/04/2019 1644   EOSABS 0.0 12/04/2019 1644   BASOSABS 0.0 12/04/2019 1644    No results found for: POCLITH, LITHIUM   No results found for: PHENYTOIN, PHENOBARB, VALPROATE, CBMZ   .res Assessment: Plan:    Pt seen for 30 minutes and time spent counseling pt regarding insomnia. Discussed discontinuing Seroquel and re-starting Trazodone since Seroquel has not been more effective than Trazodone and discussed that Seroquel has increased risk of adverse effects to include metabolic side effects. Discussed that Trazodone has been effective for her in the past and not taking it for several weeks may lead to Trazodone becoming more effective. Will re-start Trazodone 100 mg 1-2 tabs po QHS prn insomnia.  Continue Wellbutrin XL 150 mg po qd for depression.  Continue Rexulti 2 mg po qd for mood s/s.  Continue Wellbutrin XL 150 mg po q am for depression.  Continue Propranolol 10 mg po BID prn anxiety.  Recommend  continuing therapy.  Pt to follow-up in 2 months or sooner if clinically indicated. Patient advised to contact office with any questions, adverse effects, or acute worsening in signs and symptoms.   Teresha was seen today for insomnia, anxiety and depression.  Diagnoses and all orders for this visit:  Primary insomnia -     traZODone (DESYREL) 100 MG tablet; Take 1-2 tablets at bedtime for insomnia -     cloNIDine (CATAPRES) 0.1 MG tablet; TAKE 1 TABLET BY MOUTH EVERYDAY AT BEDTIME  Other depression -     brexpiprazole (REXULTI) 2 MG TABS tablet; Take 1 tablet (2 mg total) by mouth daily. -     buPROPion (WELLBUTRIN XL) 150 MG 24 hr tablet; Take 1 tablet (150 mg total) by mouth daily. -     DULoxetine (CYMBALTA) 60 MG capsule; Take 1 capsule (60 mg total) by mouth daily.  Anxiety -     brexpiprazole (REXULTI) 2 MG TABS tablet; Take 1 tablet (2 mg total) by mouth daily. -     cloNIDine (CATAPRES) 0.1 MG tablet; TAKE 1 TABLET BY MOUTH EVERYDAY AT BEDTIME -     DULoxetine (CYMBALTA) 60 MG capsule; Take 1 capsule (60 mg total) by mouth daily. -     propranolol (INDERAL) 10 MG tablet; TAKE 1 TABLET BY MOUTH TWICE A DAY AS NEEDED FOR ANXIETY    Please see After Visit Summary for patient specific instructions.  Future Appointments  Date Time Provider Department Center  01/05/2021  4:00 PM Corie Chiquito, PMHNP [facility] None    No orders of the defined types were placed in this encounter.   -------------------------------

## 2020-11-07 ENCOUNTER — Other Ambulatory Visit: Payer: Self-pay | Admitting: Psychiatry

## 2020-11-07 DIAGNOSIS — F5101 Primary insomnia: Secondary | ICD-10-CM

## 2020-11-10 ENCOUNTER — Other Ambulatory Visit: Payer: Self-pay | Admitting: Psychiatry

## 2020-11-10 DIAGNOSIS — F5101 Primary insomnia: Secondary | ICD-10-CM

## 2020-11-10 DIAGNOSIS — F419 Anxiety disorder, unspecified: Secondary | ICD-10-CM

## 2020-11-29 ENCOUNTER — Other Ambulatory Visit: Payer: Self-pay | Admitting: Psychiatry

## 2020-11-29 DIAGNOSIS — F3289 Other specified depressive episodes: Secondary | ICD-10-CM

## 2020-11-29 DIAGNOSIS — F419 Anxiety disorder, unspecified: Secondary | ICD-10-CM

## 2020-11-29 DIAGNOSIS — F5101 Primary insomnia: Secondary | ICD-10-CM

## 2020-12-27 ENCOUNTER — Other Ambulatory Visit: Payer: Self-pay | Admitting: Psychiatry

## 2020-12-27 DIAGNOSIS — F3289 Other specified depressive episodes: Secondary | ICD-10-CM

## 2020-12-27 DIAGNOSIS — F5101 Primary insomnia: Secondary | ICD-10-CM

## 2020-12-27 DIAGNOSIS — F419 Anxiety disorder, unspecified: Secondary | ICD-10-CM

## 2021-01-05 ENCOUNTER — Other Ambulatory Visit: Payer: Self-pay | Admitting: Psychiatry

## 2021-01-05 ENCOUNTER — Ambulatory Visit (INDEPENDENT_AMBULATORY_CARE_PROVIDER_SITE_OTHER): Payer: 59 | Admitting: Psychiatry

## 2021-01-05 ENCOUNTER — Encounter: Payer: Self-pay | Admitting: Psychiatry

## 2021-01-05 ENCOUNTER — Other Ambulatory Visit: Payer: Self-pay

## 2021-01-05 DIAGNOSIS — F419 Anxiety disorder, unspecified: Secondary | ICD-10-CM

## 2021-01-05 DIAGNOSIS — F3289 Other specified depressive episodes: Secondary | ICD-10-CM

## 2021-01-05 DIAGNOSIS — F5101 Primary insomnia: Secondary | ICD-10-CM

## 2021-01-05 MED ORDER — PROPRANOLOL HCL 10 MG PO TABS: ORAL_TABLET | ORAL | 2 refills | Status: DC

## 2021-01-05 MED ORDER — BREXPIPRAZOLE 2 MG PO TABS: 2.0000 mg | ORAL_TABLET | Freq: Every day | ORAL | 3 refills | Status: DC

## 2021-01-05 MED ORDER — TRAZODONE HCL 100 MG PO TABS: ORAL_TABLET | ORAL | 3 refills | Status: DC

## 2021-01-05 MED ORDER — BUPROPION HCL ER (XL) 150 MG PO TB24: 150.0000 mg | ORAL_TABLET | Freq: Every day | ORAL | 3 refills | Status: DC

## 2021-01-05 MED ORDER — CLONIDINE HCL 0.1 MG PO TABS: ORAL_TABLET | ORAL | 3 refills | Status: DC

## 2021-01-05 MED ORDER — DULOXETINE HCL 60 MG PO CPEP: 60.0000 mg | ORAL_CAPSULE | Freq: Every day | ORAL | 3 refills | Status: DC

## 2021-01-05 MED ORDER — DULOXETINE HCL 30 MG PO CPEP: 30.0000 mg | ORAL_CAPSULE | Freq: Every day | ORAL | 3 refills | Status: DC

## 2021-01-05 NOTE — Progress Notes (Signed)
[name] [id number] 26-Jun-1965 55 y.o.  Subjective:   Patient ID:  [name] is a 55 y.o. (DOB 02/03/1966) female.  Chief Complaint:  Chief Complaint  Patient presents with   Depression   Anxiety    Depression        Past medical history includes anxiety.   Anxiety Patient reports no shortness of breath.    [name] presents to the office today for follow-up of anxiety, depression, and insomnia. She has started working and reports that it "has been challenging physically."   She reports "I don't think I am ever not going to be depressed, but I am ok." She reports that she has had a few days where anxiety was elevated "but they are fewer and farther between." Denies any physical s/s with anxiety. Describes occ "overwhelming feeling." Reports that sleep is ok and Trazodone seems to be effective again. Energy and motivation have been ok. Appetite fluctuates. Concentration is ok. Denies SI.   She reports last ETOH use was April 15th.   She reports that work amount varies and works 10-hour shifts.   Has upcoming 35th HS reunion.   Oldest son has graduated from college and is working in Community education officer in [place]. Youngest son is doing well. Reports that she misses her sons. Saw brother and sister-in-law Monday night, along with her parents. Has not been talking with her twin sister. 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   Continues to see therapist. She reports recognizing she has more grief than she previously had. Going to meetings most days and has a sponsor.   Past medication trials: Lexapro-does not recall significant response Sertraline Cymbalta Mirtazapine Wellbutrin Rexulti Abilify Topamax Trileptal Gabapentin Seroquel Hydroxyzine-ineffective for anxiety BuSpar Trazodone-Ineffective Doxepin- Reports this was helpful when she was in rehab Clonidine- Helpful for anxiety Baclofen- did not notice any change in  cravings Propranolol- some improvment  AIMS    Flowsheet Row Office Visit from 09/15/2020 in [facility] Office Visit from 07/24/2019 in [facility]  AIMS Total Score 0 0      PHQ2-9    Flowsheet Row Office Visit from 10/21/2019 in [facility] Office Visit from 06/26/2019 in [facility] Office Visit from 02/06/2013 in [facility]  PHQ-2 Total Score 2 3 0  PHQ-9 Total Score 5 8 --        Review of Systems:  Review of Systems  Respiratory:  Negative for cough and shortness of breath.   Musculoskeletal:  Negative for gait problem.  Allergic/Immunologic: Positive for environmental allergies.  Neurological:  Negative for tremors.  Psychiatric/Behavioral:  Positive for depression.        Please refer to HPI   Medications: I have reviewed the patient's current medications.  Current Outpatient Medications  Medication Sig Dispense Refill   DULoxetine (CYMBALTA) 30 MG capsule Take 1 capsule (30 mg total) by mouth daily. Take with 60 mg capsule to equal total dose of 90 mg 30 capsule 3   ibuprofen (ADVIL) 600 MG tablet Take 1 tablet (600 mg total) by mouth every 6 (six) hours as needed. 30 tablet 0   naproxen sodium (ALEVE) 220 MG tablet Take 220 mg by mouth daily as needed (For pain).      rosuvastatin (CRESTOR) 20 MG tablet Take 20 mg by mouth every evening.     albuterol (VENTOLIN HFA) 108 (90 Base) MCG/ACT inhaler Inhale 1 puff into the  lungs every 6 (six) hours as needed for wheezing or shortness of breath.      brexpiprazole (REXULTI) 2 MG TABS tablet Take 1 tablet (2 mg total) by mouth daily. 30 tablet 3   buPROPion (WELLBUTRIN XL) 150 MG 24 hr tablet Take 1 tablet (150 mg total) by mouth daily. 30 tablet 3   cloNIDine (CATAPRES) 0.1 MG tablet TAKE 1 TABLET BY MOUTH EVERYDAY AT BEDTIME 30 tablet 3   DULoxetine (CYMBALTA) 60 MG capsule Take 1 capsule (60 mg total) by mouth daily. Take with 30  mg capsule to equal total dose of 90 mg 30 capsule 3   ergocalciferol (VITAMIN D2) 1.25 MG (50000 UT) capsule Take 50,000 Units by mouth once a week. Tuesdays (Patient not taking: No sig reported)     propranolol (INDERAL) 10 MG tablet TAKE 1 TABLET BY MOUTH TWICE A DAY AS NEEDED FOR ANXIETY 60 tablet 2   traZODone (DESYREL) 100 MG tablet Take 2-3 tabs po QHS 90 tablet 3   No current facility-administered medications for this visit.    Medication Side Effects: None  Allergies:  Allergies  Allergen Reactions   Erythromycin     Stomach  cramps    Past Medical History:  Diagnosis Date   Alcohol dependence (HCC)    Allergy    Depression    Elevated cholesterol    Headache    HTN (hypertension)    Vitamin D deficiency     Past Medical History, Surgical history, Social history, and Family history were reviewed and updated as appropriate.   Please see review of systems for further details on the patient's review from today.   Objective:   Physical Exam:  There were no vitals taken for this visit.  Physical Exam Constitutional:      General: She is not in acute distress. Musculoskeletal:        General: No deformity.  Neurological:     Mental Status: She is alert and oriented to person, place, and time.     Coordination: Coordination normal.  Psychiatric:        Attention and Perception: Attention and perception normal. She does not perceive auditory or visual hallucinations.        Mood and Affect: Mood is not anxious. Affect is not labile, blunt, angry or inappropriate.        Speech: Speech normal.        Behavior: Behavior normal.        Thought Content: Thought content normal. Thought content is not paranoid or delusional. Thought content does not include homicidal or suicidal ideation. Thought content does not include homicidal or suicidal plan.        Cognition and Memory: Cognition and memory normal.        Judgment: Judgment normal.     Comments: Insight  intact Dysphoric mood    Lab Review:     Component Value Date/Time   NA 139 12/04/2019 1644   K 3.4 (L) 12/04/2019 1644   CL 105 12/04/2019 1644   CO2 22 12/04/2019 1644   GLUCOSE 116 (H) 12/04/2019 1644   BUN 13 12/04/2019 1644   CREATININE 0.87 12/04/2019 1644   CALCIUM 8.6 (L) 12/04/2019 1644   PROT 6.9 12/04/2019 1644   ALBUMIN 3.7 12/04/2019 1644   AST 37 12/04/2019 1644   ALT 27 12/04/2019 1644   ALKPHOS 67 12/04/2019 1644   BILITOT 0.8 12/04/2019 1644   GFRNONAA >60 12/04/2019 1644   GFRAA >60 12/04/2019 1644  Component Value Date/Time   WBC 12.1 (H) 12/04/2019 1644   RBC 4.24 12/04/2019 1644   HGB 13.4 12/04/2019 1644   HCT 39.2 12/04/2019 1644   PLT 265 12/04/2019 1644   MCV 92.5 12/04/2019 1644   MCH 31.6 12/04/2019 1644   MCHC 34.2 12/04/2019 1644   RDW 12.4 12/04/2019 1644   LYMPHSABS 2.4 12/04/2019 1644   MONOABS 0.5 12/04/2019 1644   EOSABS 0.0 12/04/2019 1644   BASOSABS 0.0 12/04/2019 1644    No results found for: POCLITH, LITHIUM   No results found for: PHENYTOIN, PHENOBARB, VALPROATE, CBMZ   .res Assessment: Plan:   Pt seen for 30 minutes and time spent counseling pt regarding potential benefits, risks, and side effects of increasing Cymbalta to 90 mg po qd. Discussed that this dose is off label since Cymbalta is approved up to 60 mg daily.  Patient agrees to trial Cymbalta 90 mg daily for anxiety and depression. Will increase trazodone to 200 -300 mg po QHS prn insomnia.  Continue Rexulti 2 mg daily for depression. Continue Wellbutrin XL 150 mg po qd for depression.  Continue Clonidine 0.1 mh po QHS for anxiety and insomnia.  Continue propranolol as needed for anxiety. Recommend continuing psychotherapy. Patient of this provider in 3 months or sooner if clinically indicated. Patient advised to contact office with any questions, adverse effects, or acute worsening in signs and symptoms.     [name] was seen today for depression and  anxiety.  Diagnoses and all orders for this visit:  Other depression -     DULoxetine (CYMBALTA) 30 MG capsule; Take 1 capsule (30 mg total) by mouth daily. Take with 60 mg capsule to equal total dose of 90 mg -     DULoxetine (CYMBALTA) 60 MG capsule; Take 1 capsule (60 mg total) by mouth daily. Take with 30 mg capsule to equal total dose of 90 mg -     buPROPion (WELLBUTRIN XL) 150 MG 24 hr tablet; Take 1 tablet (150 mg total) by mouth daily. -     brexpiprazole (REXULTI) 2 MG TABS tablet; Take 1 tablet (2 mg total) by mouth daily.  Anxiety -     DULoxetine (CYMBALTA) 30 MG capsule; Take 1 capsule (30 mg total) by mouth daily. Take with 60 mg capsule to equal total dose of 90 mg -     DULoxetine (CYMBALTA) 60 MG capsule; Take 1 capsule (60 mg total) by mouth daily. Take with 30 mg capsule to equal total dose of 90 mg -     propranolol (INDERAL) 10 MG tablet; TAKE 1 TABLET BY MOUTH TWICE A DAY AS NEEDED FOR ANXIETY -     brexpiprazole (REXULTI) 2 MG TABS tablet; Take 1 tablet (2 mg total) by mouth daily. -     cloNIDine (CATAPRES) 0.1 MG tablet; TAKE 1 TABLET BY MOUTH EVERYDAY AT BEDTIME  Primary insomnia -     traZODone (DESYREL) 100 MG tablet; Take 2-3 tabs po QHS -     cloNIDine (CATAPRES) 0.1 MG tablet; TAKE 1 TABLET BY MOUTH EVERYDAY AT BEDTIME    Please see After Visit Summary for patient specific instructions.  Future Appointments  Date Time Provider Department Center  04/03/2021  1:30 PM Corie Chiquito, PMHNP [facility] None    No orders of the defined types were placed in this encounter.   -------------------------------

## 2021-01-09 ENCOUNTER — Other Ambulatory Visit: Payer: Self-pay | Admitting: Psychiatry

## 2021-01-09 DIAGNOSIS — F5101 Primary insomnia: Secondary | ICD-10-CM

## 2021-01-18 ENCOUNTER — Other Ambulatory Visit: Payer: Self-pay | Admitting: Psychiatry

## 2021-01-18 DIAGNOSIS — F419 Anxiety disorder, unspecified: Secondary | ICD-10-CM

## 2021-01-18 DIAGNOSIS — F3289 Other specified depressive episodes: Secondary | ICD-10-CM

## 2021-01-28 ENCOUNTER — Other Ambulatory Visit: Payer: Self-pay | Admitting: Psychiatry

## 2021-01-28 DIAGNOSIS — F419 Anxiety disorder, unspecified: Secondary | ICD-10-CM

## 2021-01-28 DIAGNOSIS — F3289 Other specified depressive episodes: Secondary | ICD-10-CM

## 2021-03-01 ENCOUNTER — Telehealth: Payer: Self-pay | Admitting: Psychiatry

## 2021-03-01 NOTE — Telephone Encounter (Signed)
Next visit is 04/03/21. [name]'s sister, [name] called and said she has heard about [facility] trials. Do you have any recommendations for this on [name] and if she has heard good results on it. Her sister [name] is listed on [name]'s [place]. Phone number is [phone].

## 2021-03-01 NOTE — Telephone Encounter (Signed)
Please review

## 2021-03-02 NOTE — Telephone Encounter (Signed)
Will discuss with patient during next apt.

## 2021-03-07 ENCOUNTER — Telehealth: Payer: Self-pay | Admitting: Psychiatry

## 2021-03-07 NOTE — Telephone Encounter (Signed)
Please review

## 2021-03-07 NOTE — Telephone Encounter (Signed)
Pt's sister-in-law, Mervyn Gay called (She is on signed DPR from May 2022).  Mervyn Gay wants to know from Shanda Bumps, if Chase is still being seen by Shanda Bumps.  She also wants to know if Shanda Bumps has heard of something called "[facility]" and what she thinks of it.  Pls call Lora back at [phone]  Next appt 12/5

## 2021-03-07 NOTE — Telephone Encounter (Signed)
Spoke to Vernona Rieger and gave her info

## 2021-03-07 NOTE — Telephone Encounter (Signed)
LVM to rtc 

## 2021-03-07 NOTE — Telephone Encounter (Signed)
Please let her know dates of last apt and next scheduled apt. Please let her know that I have heard of [facility] and my understanding is they are a telehealth company that prescribes psychedelic treatment. I would have concerns with anyone with a substance abuse hx managing this type of treatment at home. Our office offers Spravato (nasal spray ketamine) treatments that are administered and monitored in the office if she is interested in ketamine as a treatment office for depression. Spravato can be discussed during next visit.

## 2021-03-19 ENCOUNTER — Other Ambulatory Visit: Payer: Self-pay | Admitting: Psychiatry

## 2021-03-19 DIAGNOSIS — F5101 Primary insomnia: Secondary | ICD-10-CM

## 2021-04-03 ENCOUNTER — Ambulatory Visit: Payer: 59 | Admitting: Psychiatry

## 2021-04-12 ENCOUNTER — Other Ambulatory Visit: Payer: Self-pay | Admitting: Psychiatry

## 2021-04-12 DIAGNOSIS — F419 Anxiety disorder, unspecified: Secondary | ICD-10-CM

## 2021-04-13 ENCOUNTER — Other Ambulatory Visit: Payer: Self-pay | Admitting: Psychiatry

## 2021-04-13 DIAGNOSIS — F419 Anxiety disorder, unspecified: Secondary | ICD-10-CM

## 2021-04-13 DIAGNOSIS — F5101 Primary insomnia: Secondary | ICD-10-CM

## 2021-04-13 NOTE — Telephone Encounter (Signed)
Please schedule appt

## 2021-04-13 NOTE — Telephone Encounter (Signed)
Last seen 9/8

## 2021-04-14 NOTE — Telephone Encounter (Signed)
LVM to schedule appt

## 2021-04-26 IMAGING — CR DG CHEST 2V
2 series · 2 of 2 positions shown · non-contrast
Comparison: 08/30/2015

CLINICAL DATA: Fall

EXAM:
CHEST - 2 VIEW

[chest lat]
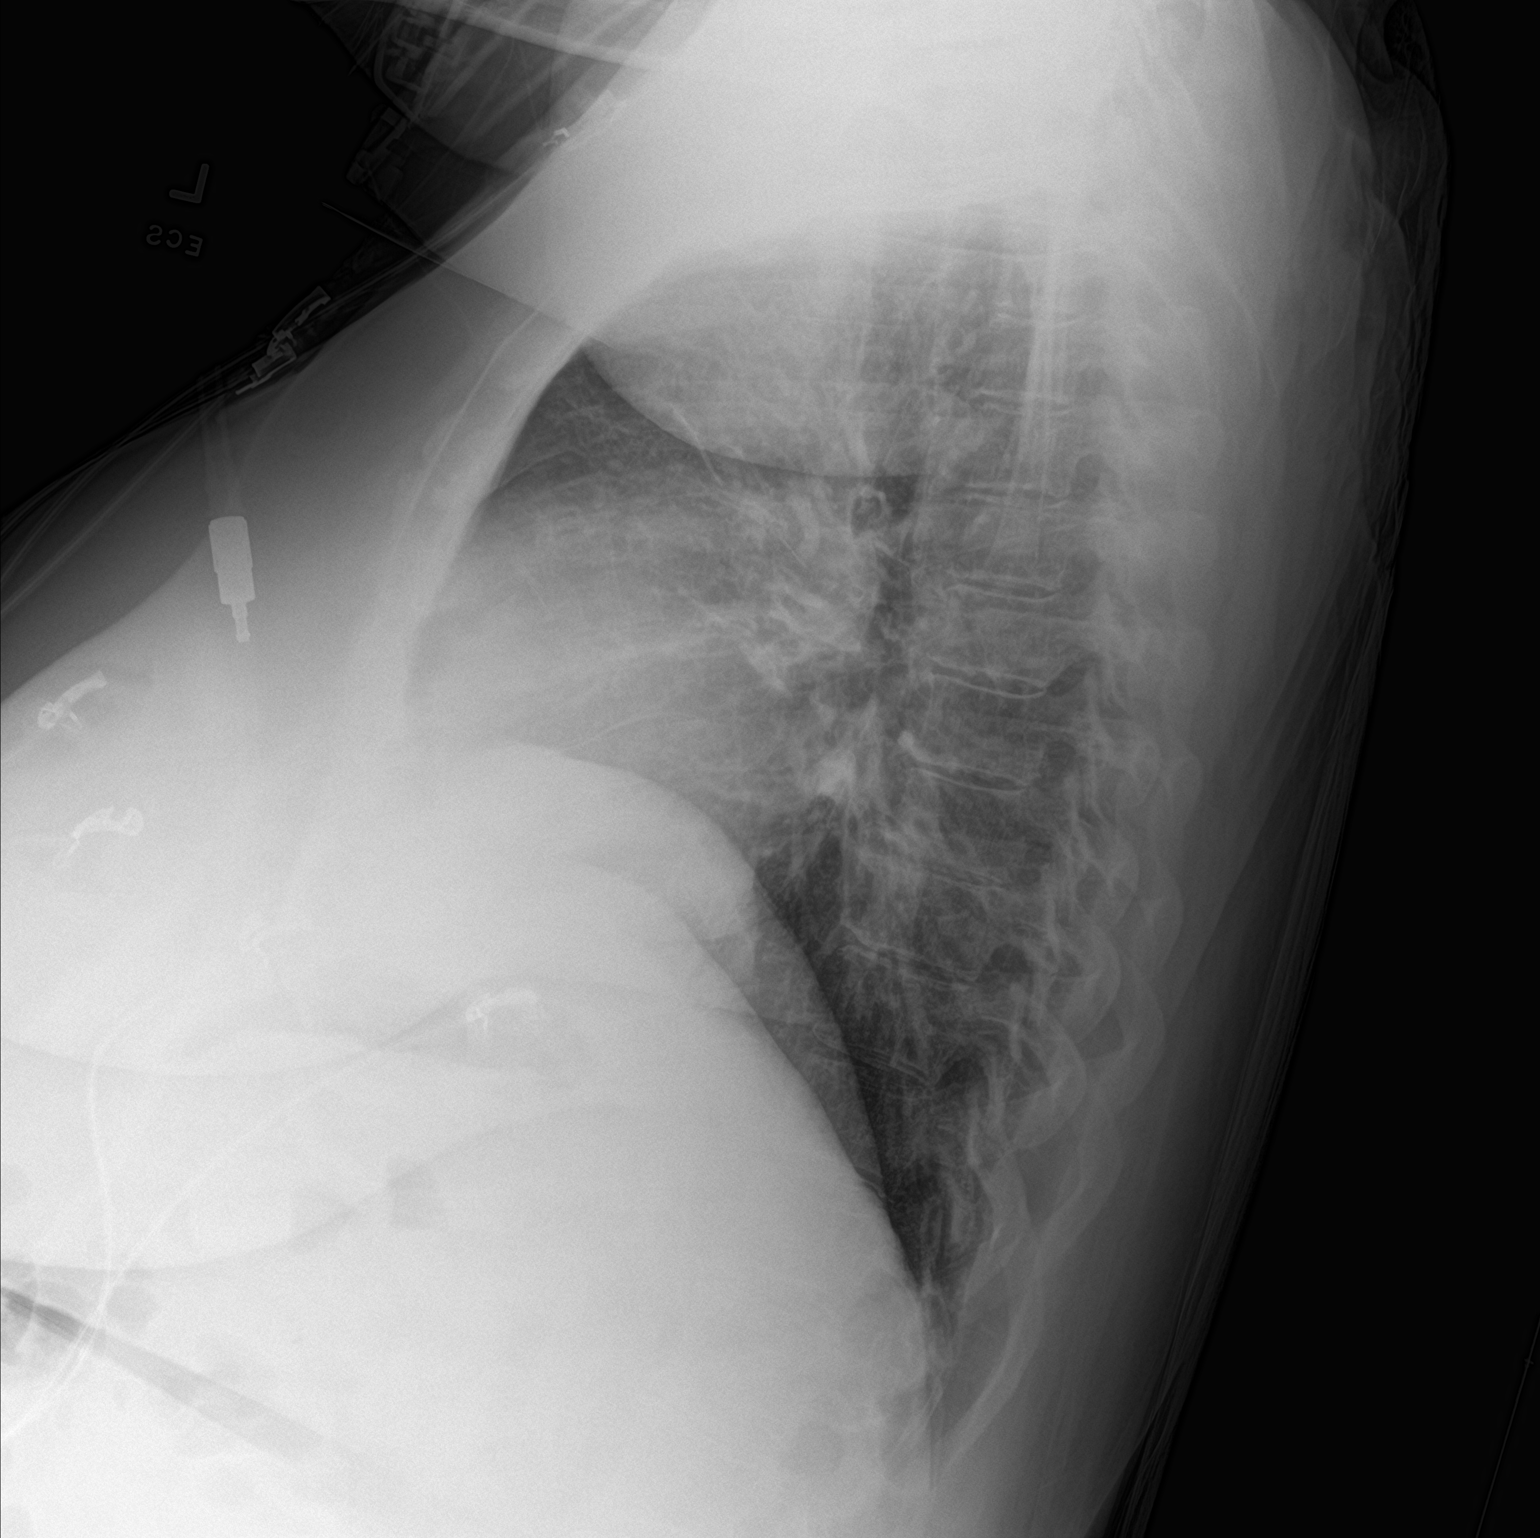

[chest ap]
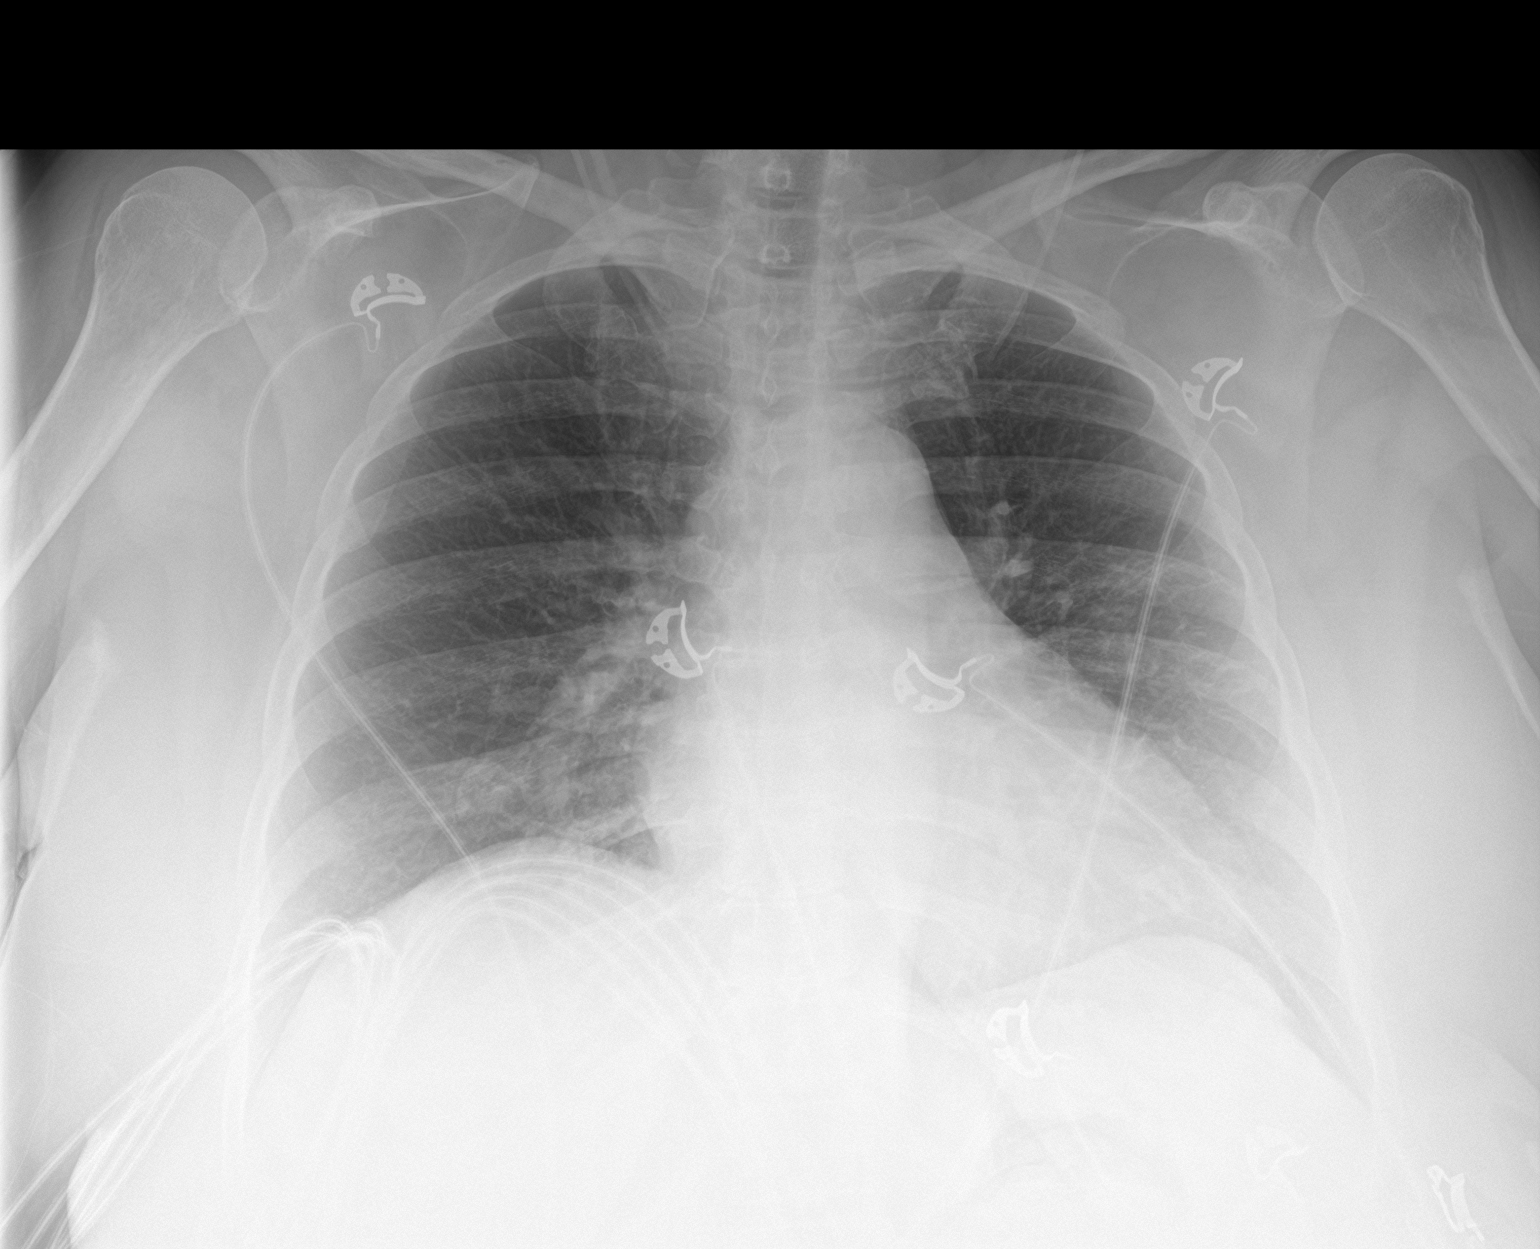

[2 of 2 positions shown; findings below may reference images not displayed]

FINDINGS: Heart size upper normal. Negative for heart failure or edema. Mild
reticular markings in the left mid lung are present in the new since
the prior study. No pleural effusion. No fracture identified.
IMPRESSION: Subtle airspace disease left mid lung. Possible early pneumonia or
atelectasis.

## 2021-04-26 IMAGING — CR DG ANKLE COMPLETE 3+V*R*
3 series · 3 of 3 positions shown · non-contrast
Comparison: None.

CLINICAL DATA: Ankle pain, fall

EXAM:
RIGHT ANKLE - COMPLETE 3+ VIEW

[ankle ap]
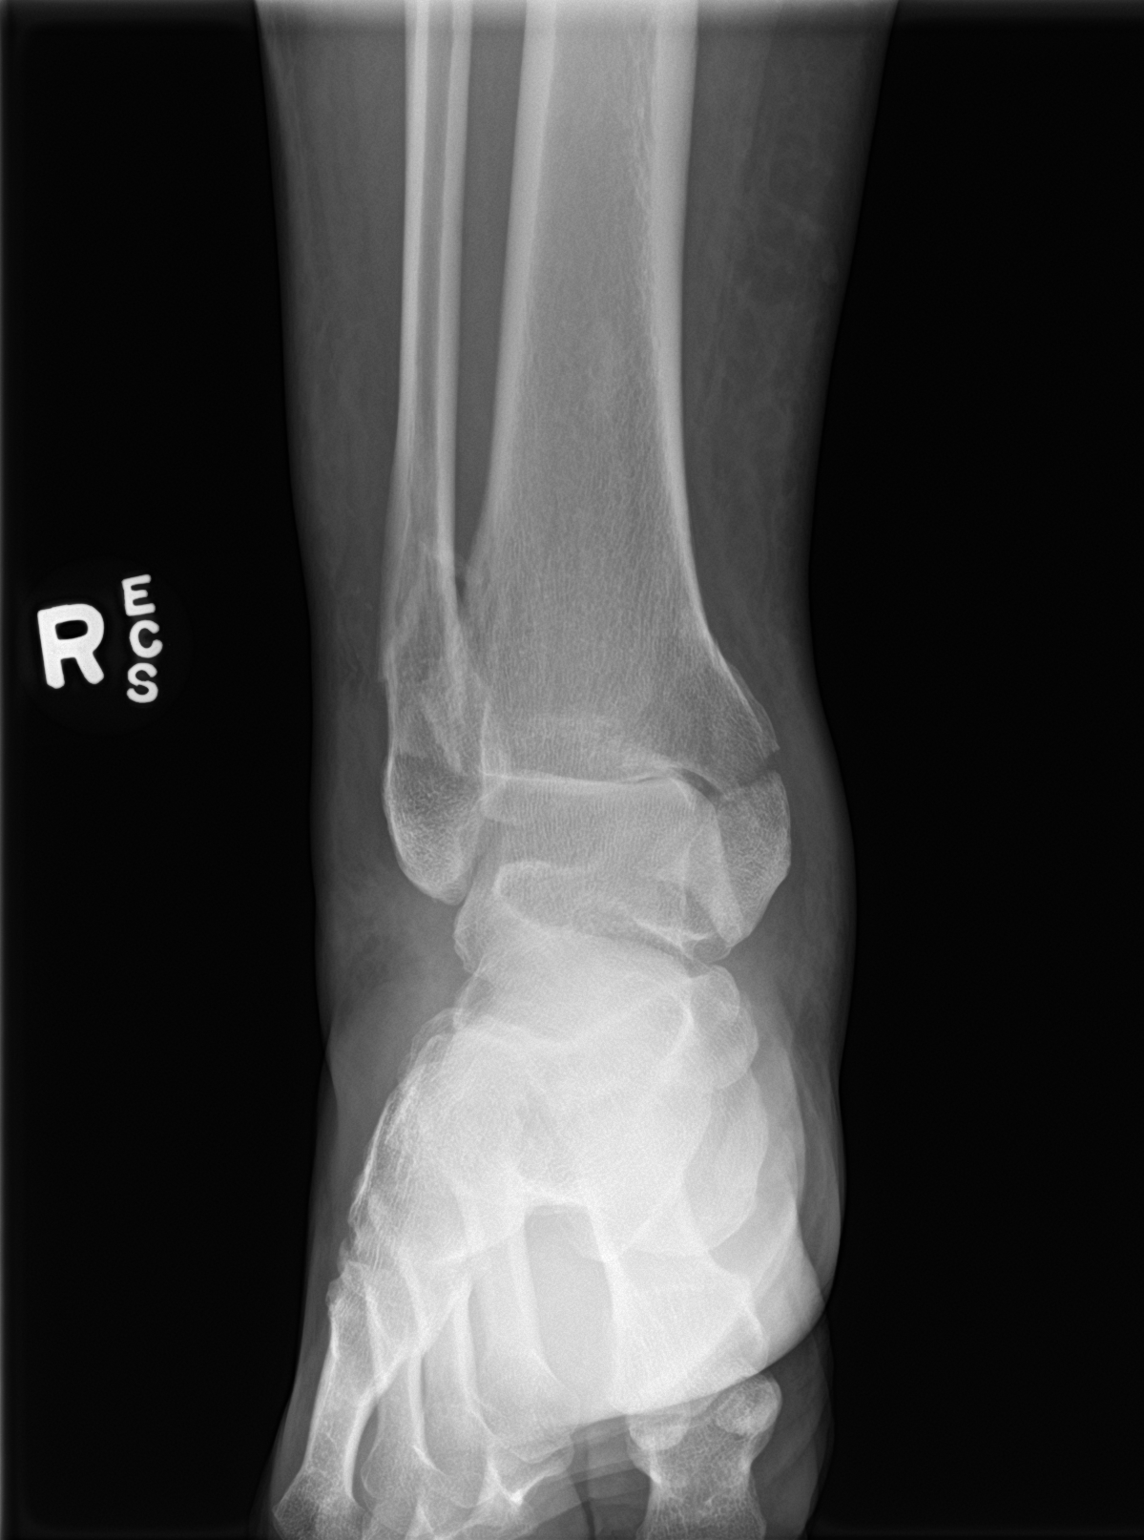

[ankle obl]
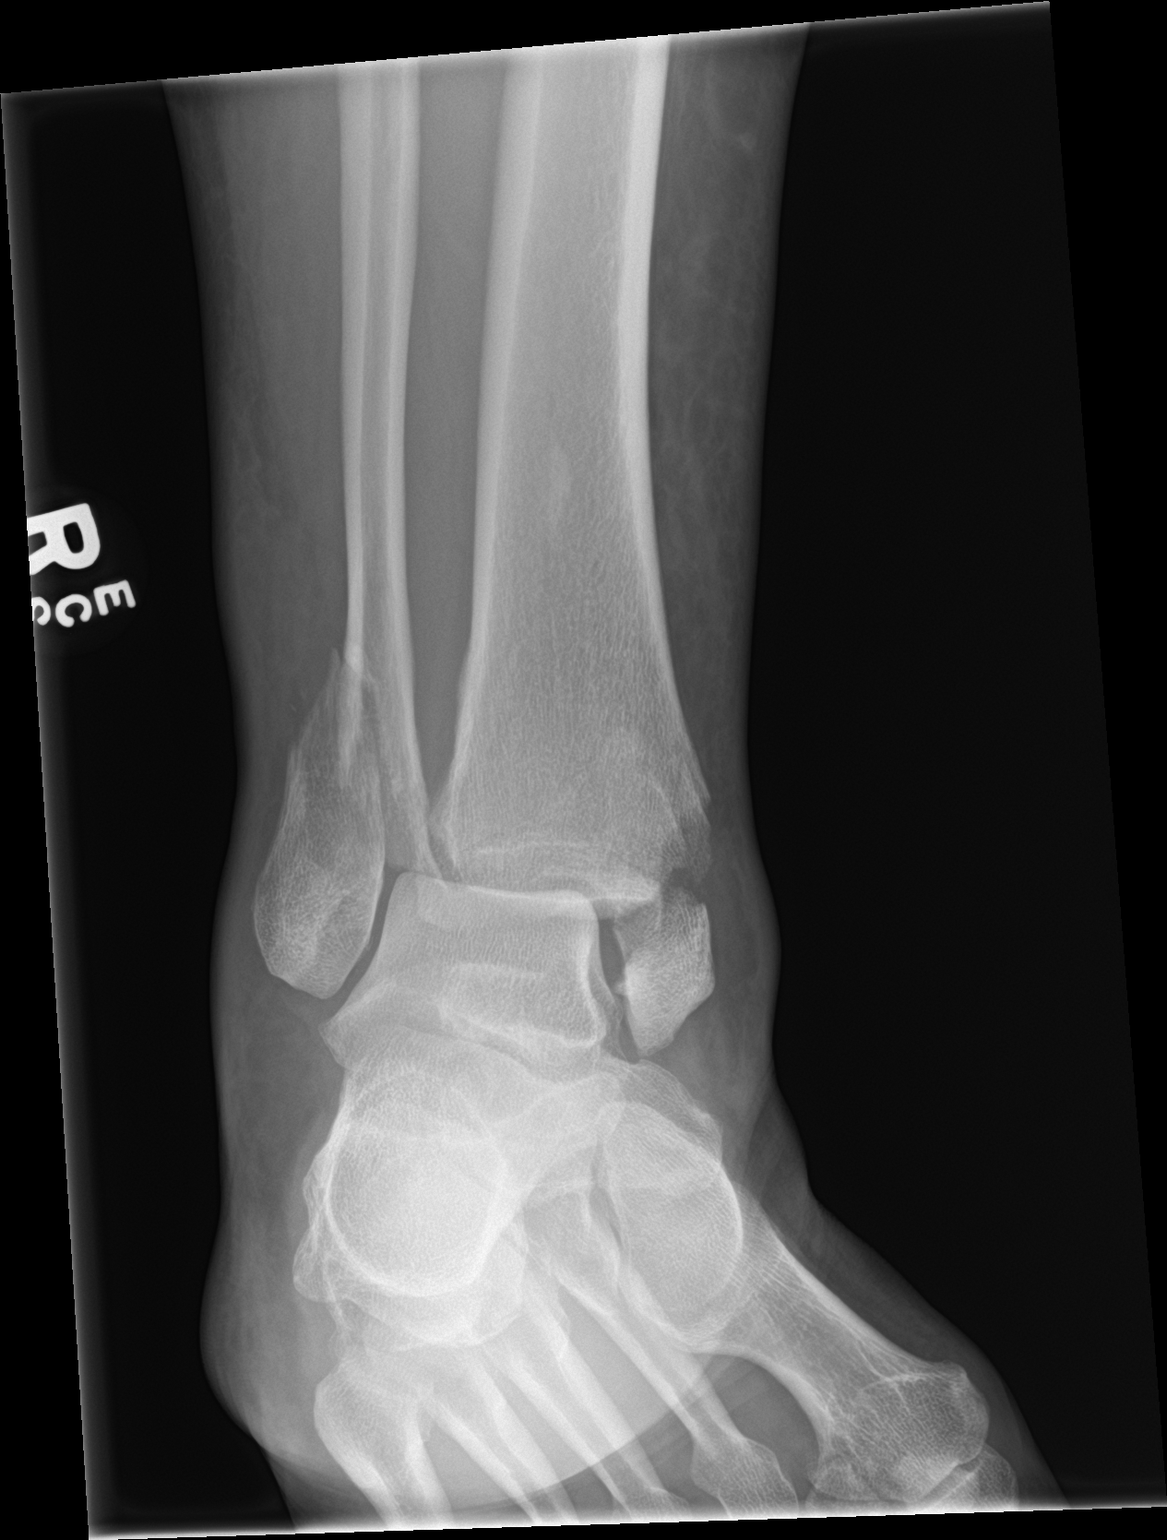

[ankle lat]
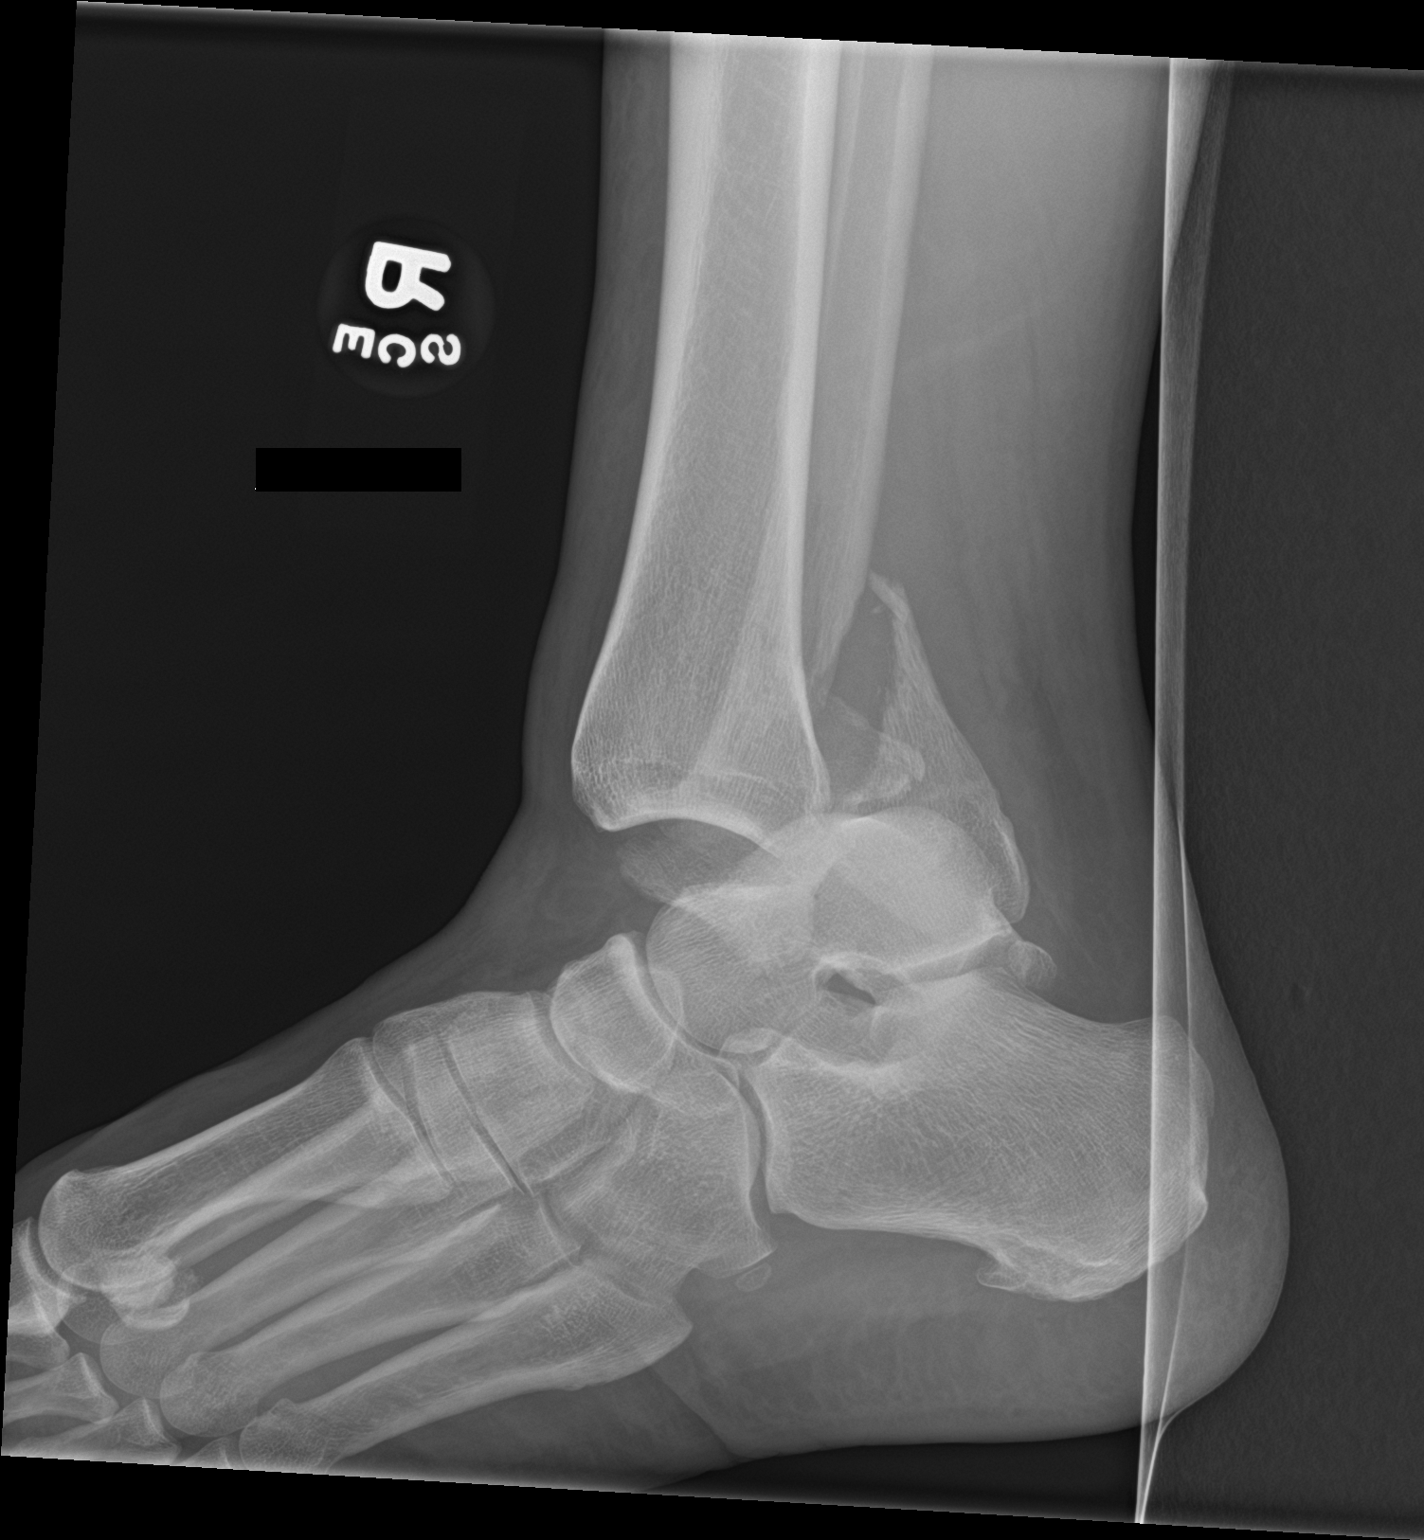

[3 of 3 positions shown; findings below may reference images not displayed]

FINDINGS: Obliquely oriented comminuted, transsyndesmotic distal
fibular/lateral malleolar fracture (Weber B)

Transverse to slightly oblique comminuted fracture of the medial
malleolus.

Suspect tiny avulsive fragmentation along the posterior malleolus as
well superimposed by the talus limiting evaluation.

Posterior talar dislocation.

Diffuse circumferential swelling and large ankle joint effusion is
present.

No additional acute fractures are seen. Corticated os trigonum and
os peroneum are present. Plantar calcaneal spur. Midfoot and
hindfoot alignment is grossly preserved though incompletely assessed
on these nondedicated, nonweightbearing films.
IMPRESSION: Fractures of the medial and lateral malleoli with possible avulsion
at the posterior malleolus as well with posterior talar dislocation.

Circumferential swelling and associated effusion.

## 2021-04-26 IMAGING — CT CT CERVICAL SPINE W/O CM
4 series · 15 of 33 positions shown, 18 images · non-contrast
Comparison: None.

CLINICAL DATA: Found on the ground.

EXAM:
CT CERVICAL SPINE WITHOUT CONTRAST
TECHNIQUE: Multidetector CT imaging of the cervical spine was performed without
intravenous contrast. Multiplanar CT image reconstructions were also
generated.

[Series 4: c_spine 2.0 st · axial · 0.39mm/px · z∈[-253,-127]mm · 5 of 95 slices shown, 7 images]
[im 16/95  soft-tissue]
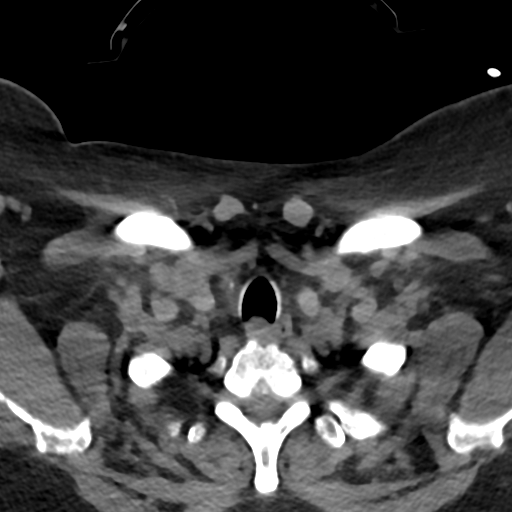
[im 16/95  bone]
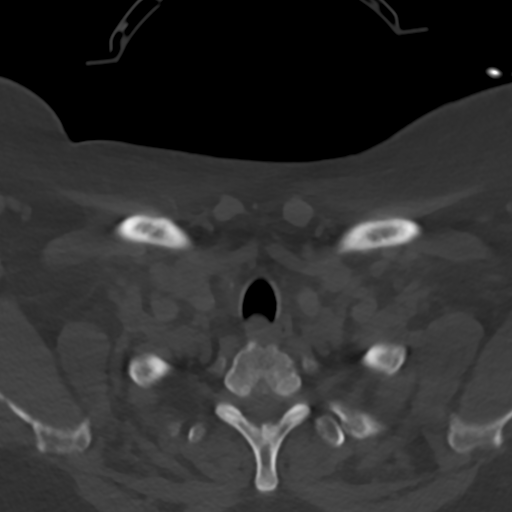
[im 32/95  bone]
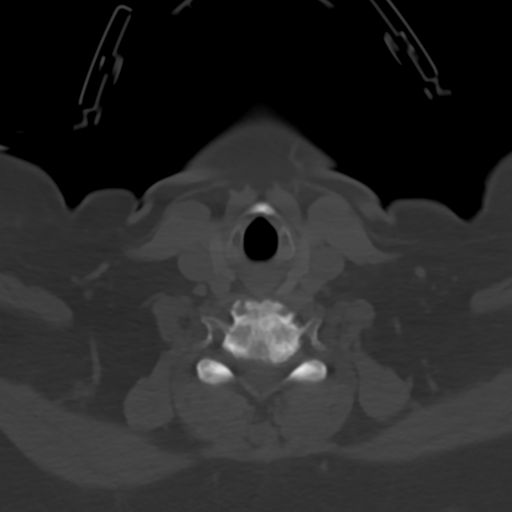
[im 48/95  bone]
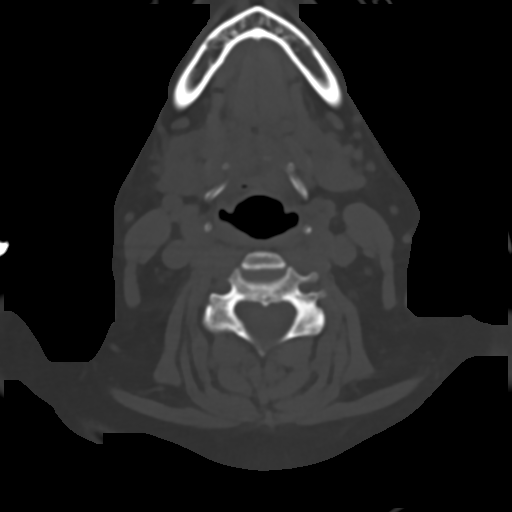
[im 63/95  bone]
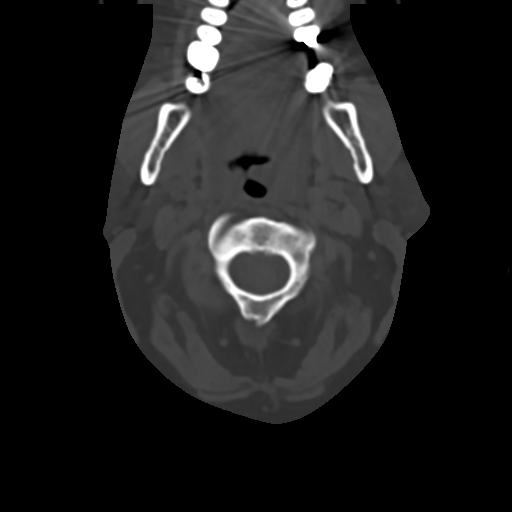
[im 79/95  soft-tissue]
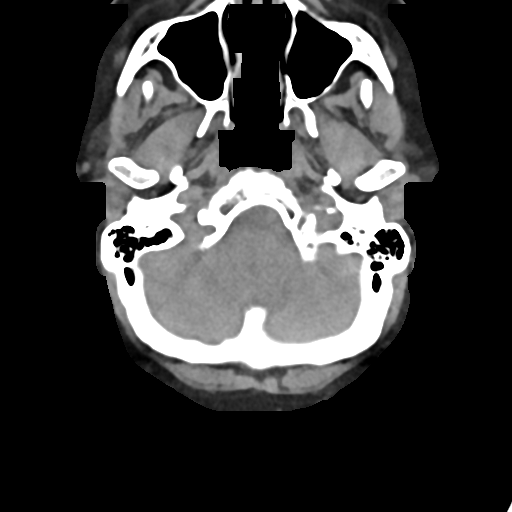
[im 79/95  bone]
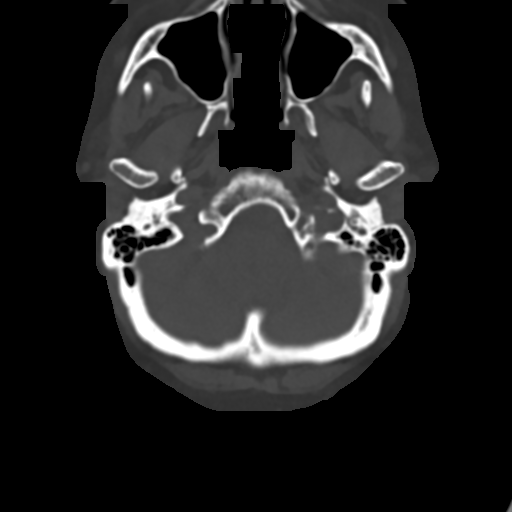

[Series 6: c_spine 2.0 sag bone · sagittal · 0.43mm/px · 5 of 61 slices shown, 6 images]
[im 21/61  bone]
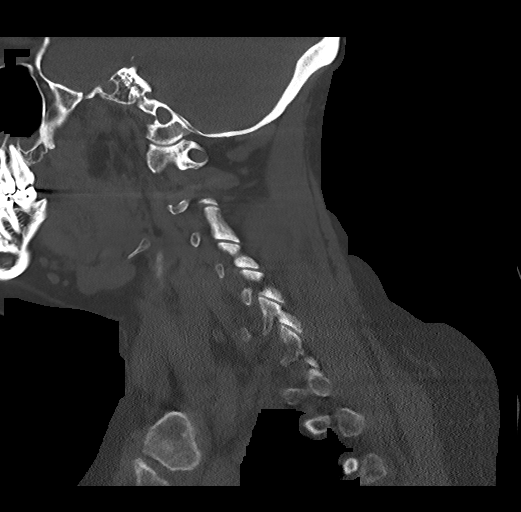
[im 26/61  bone]
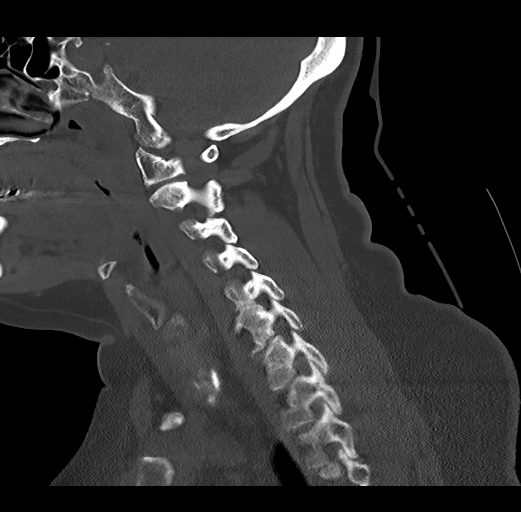
[im 31/61  soft-tissue]
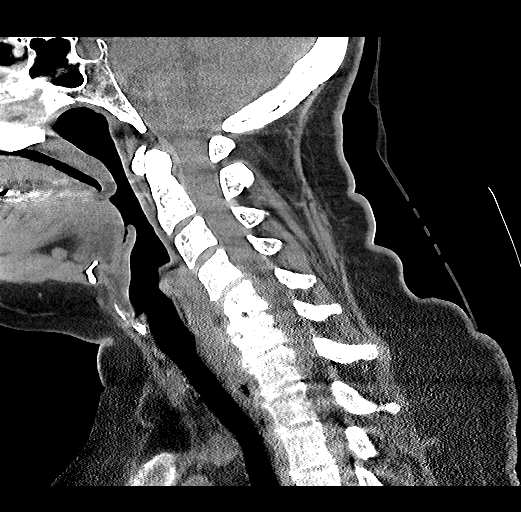
[im 31/61  bone]
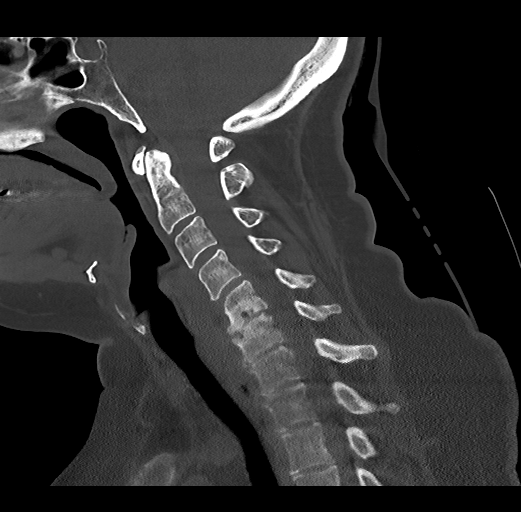
[im 36/61  bone]
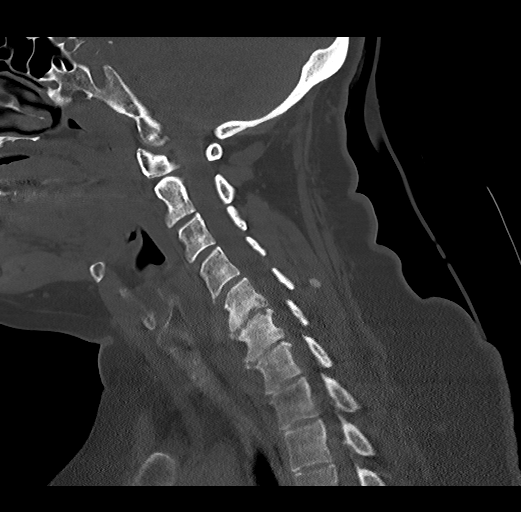
[im 41/61  bone]
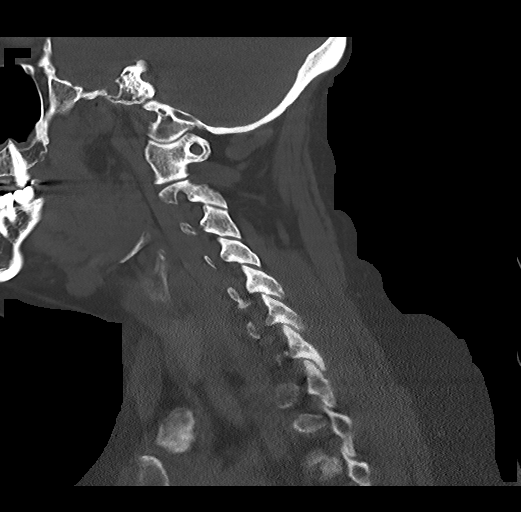

[Series 7: c_spine 2.0 cor bone · coronal · 0.28mm/px · 3 of 75 slices shown]
[im 15/75  bone]
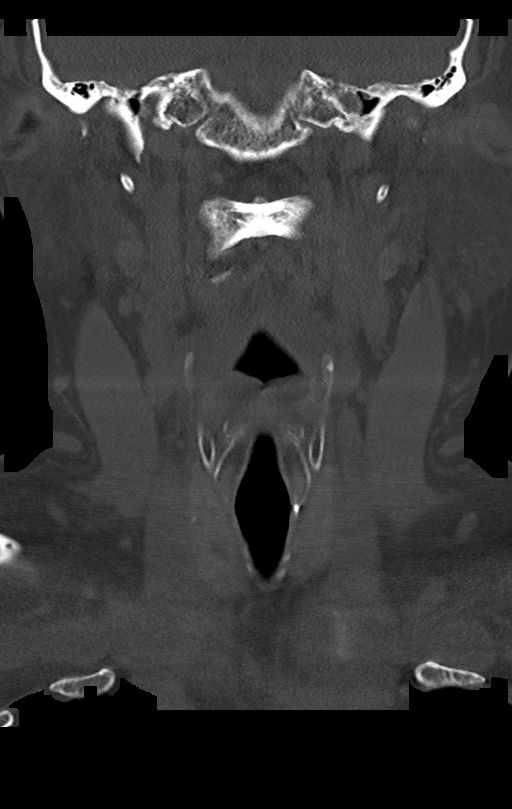
[im 30/75  bone]
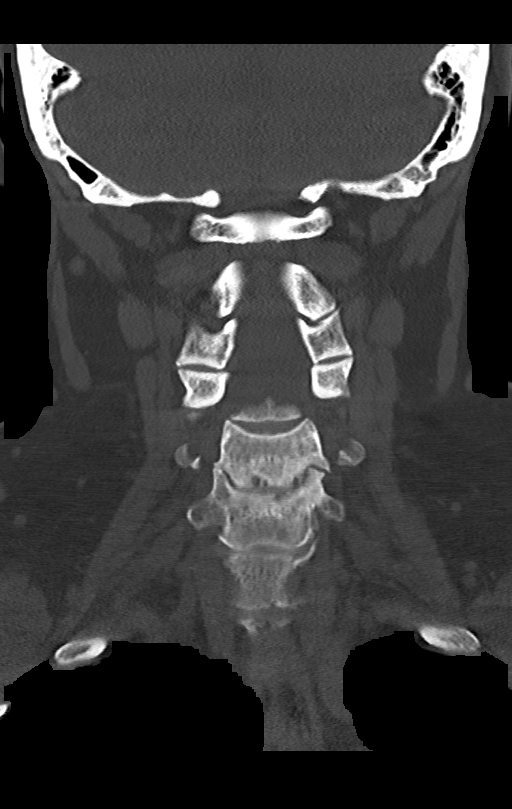
[im 45/75  bone]
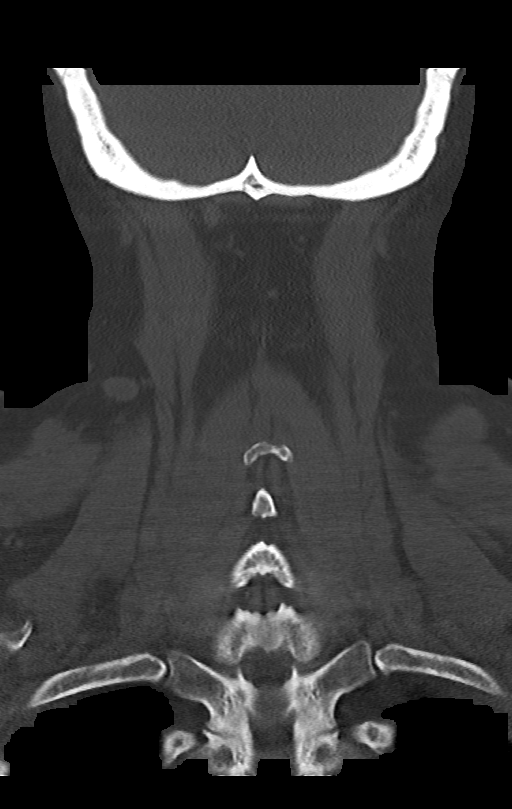

[Series 8: c_spine 2.0 orthogonals · axial · 0.21mm/px · z∈[-267,-244]mm · 2 of 88 slices shown]
[im 15/88  bone]
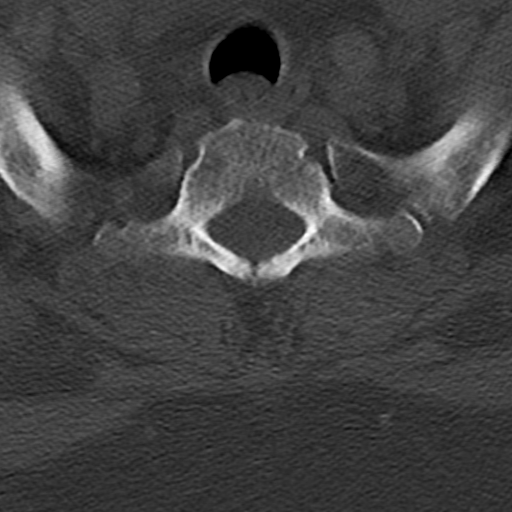
[im 30/88  bone]
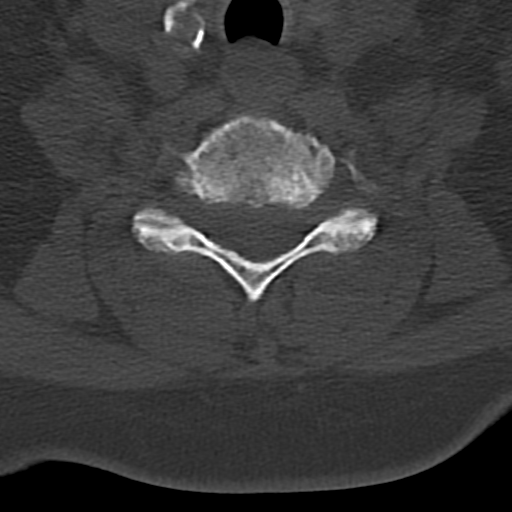

[15 of 33 positions shown; findings below may reference images not displayed]

FINDINGS: Alignment: Normal.

Skull base and vertebrae: No acute fracture. No primary bone lesion
or focal pathologic process.

Soft tissues and spinal canal: No prevertebral fluid or swelling. No
visible canal hematoma.

Disc levels: Marked severity endplate sclerosis is seen at the level
of C5-C6.

Moderate to marked severity intervertebral disc space narrowing is
seen at the level of C5-C6 and C6-C7.

Normal bilateral multilevel facet joints are seen.

Upper chest: Mild anterior right apical atelectasis is present.

Other: None.
IMPRESSION: 1. No acute osseous abnormality.
2. Moderate to marked severity degenerative disc disease at the
level of C5-C6 and C6-C7.

## 2021-06-01 ENCOUNTER — Other Ambulatory Visit: Payer: Self-pay | Admitting: Psychiatry

## 2021-06-01 DIAGNOSIS — F5101 Primary insomnia: Secondary | ICD-10-CM

## 2021-06-01 DIAGNOSIS — F419 Anxiety disorder, unspecified: Secondary | ICD-10-CM

## 2021-06-14 ENCOUNTER — Other Ambulatory Visit: Payer: Self-pay | Admitting: Psychiatry

## 2021-06-14 DIAGNOSIS — F5101 Primary insomnia: Secondary | ICD-10-CM

## 2021-06-14 DIAGNOSIS — F419 Anxiety disorder, unspecified: Secondary | ICD-10-CM

## 2021-06-16 ENCOUNTER — Other Ambulatory Visit: Payer: Self-pay | Admitting: Psychiatry

## 2021-06-16 DIAGNOSIS — F419 Anxiety disorder, unspecified: Secondary | ICD-10-CM

## 2021-06-16 DIAGNOSIS — F5101 Primary insomnia: Secondary | ICD-10-CM

## 2021-06-29 ENCOUNTER — Other Ambulatory Visit: Payer: Self-pay | Admitting: Psychiatry

## 2021-06-29 DIAGNOSIS — F5101 Primary insomnia: Secondary | ICD-10-CM

## 2021-06-29 NOTE — Telephone Encounter (Signed)
Please schedule appt

## 2021-06-30 NOTE — Telephone Encounter (Signed)
LVM for pt to call and schedule  °

## 2021-07-19 ENCOUNTER — Other Ambulatory Visit: Payer: Self-pay | Admitting: Psychiatry

## 2021-07-19 DIAGNOSIS — F419 Anxiety disorder, unspecified: Secondary | ICD-10-CM

## 2021-07-19 DIAGNOSIS — F5101 Primary insomnia: Secondary | ICD-10-CM

## 2021-07-25 ENCOUNTER — Other Ambulatory Visit: Payer: Self-pay | Admitting: Psychiatry

## 2021-07-25 DIAGNOSIS — F5101 Primary insomnia: Secondary | ICD-10-CM

## 2021-07-29 ENCOUNTER — Other Ambulatory Visit: Payer: Self-pay | Admitting: Psychiatry

## 2021-07-29 DIAGNOSIS — F5101 Primary insomnia: Secondary | ICD-10-CM

## 2021-08-24 ENCOUNTER — Other Ambulatory Visit: Payer: Self-pay | Admitting: Psychiatry

## 2021-08-24 DIAGNOSIS — F3289 Other specified depressive episodes: Secondary | ICD-10-CM

## 2021-08-24 DIAGNOSIS — F419 Anxiety disorder, unspecified: Secondary | ICD-10-CM

## 2021-08-24 NOTE — Telephone Encounter (Signed)
PA needed

## 2021-08-29 ENCOUNTER — Telehealth: Payer: Self-pay

## 2021-08-29 NOTE — Telephone Encounter (Signed)
PA initiated 08/29/21 ?

## 2021-08-29 NOTE — Telephone Encounter (Addendum)
Prior Authorization  Initiated.  Rexulti 2 mg  [employer] [facility]  Approved Coverage Start Date:08/31/2021;Coverage End Date:08/31/2022

## 2022-05-19 ENCOUNTER — Encounter (HOSPITAL_COMMUNITY): Payer: Self-pay

## 2022-05-19 ENCOUNTER — Other Ambulatory Visit: Payer: Self-pay

## 2022-05-19 ENCOUNTER — Emergency Department (HOSPITAL_COMMUNITY): Payer: Medicaid Other

## 2022-05-19 ENCOUNTER — Emergency Department (HOSPITAL_COMMUNITY)
Admission: EM | Admit: 2022-05-19 | Discharge: 2022-05-19 | Disposition: A | Discharge status: Home / Self Care | Payer: Medicaid Other | Attending: Emergency Medicine | Admitting: Emergency Medicine

## 2022-05-19 DIAGNOSIS — W109XXA Fall (on) (from) unspecified stairs and steps, initial encounter: Secondary | ICD-10-CM | POA: Diagnosis not present

## 2022-05-19 DIAGNOSIS — Y9301 Activity, walking, marching and hiking: Secondary | ICD-10-CM | POA: Diagnosis not present

## 2022-05-19 DIAGNOSIS — I10 Essential (primary) hypertension: Secondary | ICD-10-CM | POA: Diagnosis not present

## 2022-05-19 DIAGNOSIS — S2232XA Fracture of one rib, left side, initial encounter for closed fracture: Secondary | ICD-10-CM

## 2022-05-19 DIAGNOSIS — S299XXA Unspecified injury of thorax, initial encounter: Secondary | ICD-10-CM | POA: Diagnosis present

## 2022-05-19 MED ORDER — CYCLOBENZAPRINE HCL 10 MG PO TABS: 10.0000 mg | ORAL_TABLET | Freq: Two times a day (BID) | ORAL | 0 refills | Status: DC | PRN

## 2022-05-19 MED ORDER — IBUPROFEN 200 MG PO TABS
600.0000 mg | ORAL_TABLET | Freq: Once | ORAL | Status: AC
  Administered 2022-05-19: 600 mg via ORAL
  Filled 2022-05-19: qty 3

## 2022-05-19 MED ORDER — IBUPROFEN 800 MG PO TABS: 800.0000 mg | ORAL_TABLET | Freq: Three times a day (TID) | ORAL | 0 refills | Status: DC

## 2022-05-19 NOTE — ED Provider Notes (Signed)
[place] EMERGENCY DEPARTMENT AT [facility] Provider Note   CSN: [id number] Arrival date & time: 05/19/22  1559     History  Chief Complaint  Patient presents with   Fall   Rib Injury    [name] is a 57 y.o. female.   Fall   57 year old female presents emergency department after a fall.  Patient states that she was walking up steps and slipped losing her balance and fell hitting the left side of her rib cage on the concrete steps.  Incident was said to occur approximately 2 days ago.  Denies feelings of shortness of breath, fever, chills, night sweats, trauma to head, loss of consciousness, blood thinner use, abdominal pain, nausea, vomiting.  Past medical history significant for alcohol dependence, hypertension, headache, depression  Home Medications Prior to Admission medications   Medication Sig Start Date End Date Taking? Authorizing Provider  cyclobenzaprine (FLEXERIL) 10 MG tablet Take 1 tablet (10 mg total) by mouth 2 (two) times daily as needed for muscle spasms. 05/19/22  Yes Dion Saucier A, PA  ibuprofen (ADVIL) 800 MG tablet Take 1 tablet (800 mg total) by mouth 3 (three) times daily. 05/19/22  Yes Dion Saucier A, PA  albuterol (VENTOLIN HFA) 108 (90 Base) MCG/ACT inhaler Inhale 1 puff into the lungs every 6 (six) hours as needed for wheezing or shortness of breath.  07/01/18   [provider]  brexpiprazole (REXULTI) 2 MG TABS tablet Take 1 tablet (2 mg total) by mouth daily. 01/05/21   Thayer Headings, PMHNP  buPROPion (WELLBUTRIN XL) 150 MG 24 hr tablet Take 1 tablet (150 mg total) by mouth daily. 01/05/21   Thayer Headings, PMHNP  cloNIDine (CATAPRES) 0.1 MG tablet TAKE 1 TABLET BY MOUTH EVERYDAY AT BEDTIME 01/05/21   Thayer Headings, PMHNP  DULoxetine (CYMBALTA) 30 MG capsule Take 1 capsule (30 mg total) by mouth daily. Take with 60 mg capsule to equal total dose of 90 mg 01/05/21   Thayer Headings, PMHNP  DULoxetine (CYMBALTA) 60 MG capsule  Take 1 capsule (60 mg total) by mouth daily. Take with 30 mg capsule to equal total dose of 90 mg 01/05/21   Thayer Headings, PMHNP  ergocalciferol (VITAMIN D2) 1.25 MG (50000 UT) capsule Take 50,000 Units by mouth once a week. Tuesdays Patient not taking: No sig reported    [provider]  naproxen sodium (ALEVE) 220 MG tablet Take 220 mg by mouth daily as needed (For pain).     [provider]  propranolol (INDERAL) 10 MG tablet TAKE 1 TABLET BY MOUTH TWICE A DAY AS NEEDED FOR ANXIETY 04/12/21   Thayer Headings, PMHNP  rosuvastatin (CRESTOR) 20 MG tablet Take 20 mg by mouth every evening. 11/10/19   [provider]  traZODone (DESYREL) 100 MG tablet TAKE 1-2 TABLETS AT BEDTIME FOR INSOMNIA 06/29/21   Thayer Headings, PMHNP      Allergies    Erythromycin    Review of Systems   Review of Systems  All other systems reviewed and are negative.   Physical Exam Updated Vital Signs BP 121/88 (BP Location: Left Arm)   Pulse 94   Temp 98.7 F (37.1 C) (Oral)   Resp 16   Ht 5\' 2"  (1.575 m)   Wt 79.8 kg   SpO2 99%   BMI 32.19 kg/m  Physical Exam Vitals and nursing note reviewed.  Constitutional:      General: She is not in acute distress.    Appearance: She is  well-developed.  HENT:     Head: Normocephalic and atraumatic.  Eyes:     Conjunctiva/sclera: Conjunctivae normal.  Cardiovascular:     Rate and Rhythm: Normal rate and regular rhythm.     Heart sounds: No murmur heard. Pulmonary:     Effort: Pulmonary effort is normal. No respiratory distress.     Breath sounds: Normal breath sounds.     Comments: Patient with left-sided lateral chest wall tenderness. Chest:     Chest wall: Tenderness present.  Abdominal:     Palpations: Abdomen is soft.     Tenderness: There is no abdominal tenderness.     Comments: No abdominal tenderness.  Musculoskeletal:        General: No swelling.     Cervical back: Neck supple.     Comments: No tenderness to palpation  of midline cervical, thoracic, lumbar spine with no obvious step-off or reported.  Patient moves all 4 extremities without difficulty.  No tender palpation of upper or lower extremities bilaterally.  Skin:    General: Skin is warm and dry.     Capillary Refill: Capillary refill takes less than 2 seconds.  Neurological:     Mental Status: She is alert.  Psychiatric:        Mood and Affect: Mood normal.     ED Results / Procedures / Treatments   Labs (all labs ordered are listed, but only abnormal results are displayed) Labs Reviewed - No data to display  EKG None  Radiology DG Ribs Unilateral W/Chest Left  Result Date: 05/19/2022 CLINICAL DATA:  Patient status post fall. Left lateral lower rib pain. EXAM: LEFT RIBS AND CHEST - 3+ VIEW COMPARISON:  Chest radiograph 02/12/2022 FINDINGS: Normal cardiac and mediastinal contours. No consolidative pulmonary opacities. No pleural effusion or pneumothorax. Nondisplaced left lateral ninth rib fracture. IMPRESSION: Nondisplaced left lateral ninth rib fracture. Electronically Signed   By: Lovey Newcomer M.D.   On: 05/19/2022 16:36    Procedures Procedures    Medications Ordered in ED Medications  ibuprofen (ADVIL) tablet 600 mg (600 mg Oral Given 05/19/22 1637)    ED Course/ Medical Decision Making/ A&P Clinical Course as of 05/19/22 1733  Sat May 19, 2022  1625 Went to evaluate patient but she is currently an x-ray.  To reassess once back. [CR]    Clinical Course User Index [CR] Wilnette Kales, PA                             Medical Decision Making Amount and/or Complexity of Data Reviewed Radiology: ordered.  Risk OTC drugs. Prescription drug management.   This patient presents to the ED for concern of fall, this involves an extensive number of treatment options, and is a complaint that carries with it a high risk of complications and morbidity.  The differential diagnosis includes fracture, strain chest pain, dislocation,  pneumothorax, solid organ damage, neurovascular compromise   Co morbidities that complicate the patient evaluation  See HPI   Additional history obtained:  Additional history obtained from EMR External records from outside source obtained and reviewed including hospital records   Lab Tests:  N/a   Imaging Studies ordered:  I ordered imaging studies including chest x-ray with left ribs I independently visualized and interpreted imaging which showed nondisplaced lateral left ninth rib fracture I agree with the radiologist interpretation   Cardiac Monitoring: / EKG:  The patient was maintained on a cardiac monitor.  I personally viewed and interpreted the cardiac monitored which showed an underlying rhythm of: Sinus rhythm   Consultations Obtained:  N/a   Problem List / ED Course / Critical interventions / Medication management  Rib fracture I ordered medication including Motrin   Reevaluation of the patient after these medicines showed that the patient improved I have reviewed the patients home medicines and have made adjustments as needed   Social Determinants of Health:  History of alcohol dependency.  Denies illicit drug/tobacco use   Test / Admission - Considered:  Rib fracture Vitals signs within normal range and stable throughout visit. Imaging studies significant for: See above Patient with evidence of isolated left ninth rib fracture.  No evidence of pneumothorax.  Imaging of the head/C-spine deemed necessary due to lack of trauma and patient being neurologically intact.  No abdominal tenderness to palpation so further workup via CT scan abdomen and laboratory studies you necessary at this time.  Patient not tachypneic able to take full deep breaths.  Given incentive spirometer while in the emergency department to use at home.  Patient recommended therapy at home with Tylenol/Motrin as needed for pain.  Recommend follow-up with primary care for reassessment  of symptoms. Worrisome signs and symptoms were discussed with the patient, and the patient acknowledged understanding to return to the ED if noticed. Patient was stable upon discharge.          Final Clinical Impression(s) / ED Diagnoses Final diagnoses:  Closed fracture of one rib of left side, initial encounter    Rx / DC Orders ED Discharge Orders          Ordered    ibuprofen (ADVIL) 800 MG tablet  3 times daily        05/19/22 1652    cyclobenzaprine (FLEXERIL) 10 MG tablet  2 times daily PRN        05/19/22 1711              Peter Garter, [place] 05/19/22 1733    Glyn Ade, MD 05/20/22 1306

## 2022-05-19 NOTE — ED Provider Triage Note (Signed)
Emergency Medicine Provider Triage Evaluation Note  [name] , a 57 y.o. female  was evaluated in triage.  Pt complains of left-sided rib pain after falling while walking up a step.  She landed on her left side hitting the L flank and ribs on concrete.  Denies any hitting head or LOC. No n/v.  She has tried ibuprofen at home with no improvement.  States she fell after having some beers that day.  Review of Systems  Positive: As above Negative: As above  Physical Exam  BP 121/88 (BP Location: Left Arm)   Pulse 94   Temp 98.7 F (37.1 C) (Oral)   Resp 16   Ht 5\' 2"  (1.575 m)   Wt 79.8 kg   SpO2 99%   BMI 32.19 kg/m  Gen:   Awake, no distress   Resp:  Normal effort  MSK:   Moves extremities without difficulty  Other:  TTP to left lower rib cage.  Medical Decision Making  Medically screening exam initiated at 4:13 PM.  Appropriate orders placed.  [name] was informed that the remainder of the evaluation will be completed by another provider, this initial triage assessment does not replace that evaluation, and the importance of remaining in the ED until their evaluation is complete.     Rex Kras, [place] 05/19/22 1616

## 2022-05-19 NOTE — ED Triage Notes (Signed)
Pt. States that she fell two days ago, hitting her right side on her steps, she is now complaining of rib pain

## 2022-05-19 NOTE — Discharge Instructions (Signed)
Note the workup today was overall consistent with rib fracture left side.  Make sure to use incentive spirometry as directed by respiratory therapy.  I have sent in medicine to take as needed for pain.  Recommend follow-up with primary care for reassessment of your symptoms and potential repeat imaging to ensure proper healing.  Please do not hesitate to return to emergency department for worrisome signs and symptoms we discussed become apparent.

## 2022-06-16 ENCOUNTER — Emergency Department (HOSPITAL_BASED_OUTPATIENT_CLINIC_OR_DEPARTMENT_OTHER)
Admission: EM | Admit: 2022-06-16 | Discharge: 2022-06-16 | Disposition: A | Discharge status: Home / Self Care | Payer: Medicaid Other | Attending: Emergency Medicine | Admitting: Emergency Medicine

## 2022-06-16 ENCOUNTER — Other Ambulatory Visit: Payer: Self-pay

## 2022-06-16 ENCOUNTER — Encounter (HOSPITAL_BASED_OUTPATIENT_CLINIC_OR_DEPARTMENT_OTHER): Payer: Self-pay | Admitting: Emergency Medicine

## 2022-06-16 DIAGNOSIS — R21 Rash and other nonspecific skin eruption: Secondary | ICD-10-CM | POA: Insufficient documentation

## 2022-06-16 DIAGNOSIS — K130 Diseases of lips: Secondary | ICD-10-CM

## 2022-06-16 MED ORDER — HYDROCODONE-ACETAMINOPHEN 5-325 MG PO TABS
2.0000 | ORAL_TABLET | Freq: Once | ORAL | Status: AC
  Administered 2022-06-16: 2 via ORAL
  Filled 2022-06-16: qty 2

## 2022-06-16 MED ORDER — ACYCLOVIR 5 % EX OINT: 1.0000 | TOPICAL_OINTMENT | CUTANEOUS | 0 refills | Status: DC

## 2022-06-16 MED ORDER — PREDNISONE 10 MG PO TABS: 20.0000 mg | ORAL_TABLET | Freq: Two times a day (BID) | ORAL | 0 refills | Status: DC

## 2022-06-16 NOTE — ED Provider Notes (Signed)
[facility] Provider Note   CSN: [id number] Arrival date & time: 06/16/22  2202     History  Chief Complaint  Patient presents with   Rash    [name] is a 57 y.o. female.  Patient is a 57 year old female presenting with complaints of severe pain to her lips for the past 2 weeks.  She reports that they are dry and cracking and that she is unable to tolerate the pain any longer.  She has been using Neosporin, Chapstick, and multiple other home remedies, however this has not helped.  She is concerned she may have some sort of infection.  She denies any fevers or chills.  She has been taking ibuprofen, but this has not helped.  The history is provided by the patient.       Home Medications Prior to Admission medications   Medication Sig Start Date End Date Taking? Authorizing Provider  albuterol (VENTOLIN HFA) 108 (90 Base) MCG/ACT inhaler Inhale 1 puff into the lungs every 6 (six) hours as needed for wheezing or shortness of breath.  07/01/18   [provider]  brexpiprazole (REXULTI) 2 MG TABS tablet Take 1 tablet (2 mg total) by mouth daily. 01/05/21   Thayer Headings, PMHNP  buPROPion (WELLBUTRIN XL) 150 MG 24 hr tablet Take 1 tablet (150 mg total) by mouth daily. 01/05/21   Thayer Headings, PMHNP  cloNIDine (CATAPRES) 0.1 MG tablet TAKE 1 TABLET BY MOUTH EVERYDAY AT BEDTIME 01/05/21   Thayer Headings, PMHNP  cyclobenzaprine (FLEXERIL) 10 MG tablet Take 1 tablet (10 mg total) by mouth 2 (two) times daily as needed for muscle spasms. 05/19/22   Wilnette Kales, PA  DULoxetine (CYMBALTA) 30 MG capsule Take 1 capsule (30 mg total) by mouth daily. Take with 60 mg capsule to equal total dose of 90 mg 01/05/21   Thayer Headings, PMHNP  DULoxetine (CYMBALTA) 60 MG capsule Take 1 capsule (60 mg total) by mouth daily. Take with 30 mg capsule to equal total dose of 90 mg 01/05/21   Thayer Headings, PMHNP  ergocalciferol (VITAMIN D2) 1.25 MG  (50000 UT) capsule Take 50,000 Units by mouth once a week. Tuesdays Patient not taking: No sig reported    [provider]  ibuprofen (ADVIL) 800 MG tablet Take 1 tablet (800 mg total) by mouth 3 (three) times daily. 05/19/22   Wilnette Kales, PA  naproxen sodium (ALEVE) 220 MG tablet Take 220 mg by mouth daily as needed (For pain).     [provider]  propranolol (INDERAL) 10 MG tablet TAKE 1 TABLET BY MOUTH TWICE A DAY AS NEEDED FOR ANXIETY 04/12/21   Thayer Headings, PMHNP  rosuvastatin (CRESTOR) 20 MG tablet Take 20 mg by mouth every evening. 11/10/19   [provider]  traZODone (DESYREL) 100 MG tablet TAKE 1-2 TABLETS AT BEDTIME FOR INSOMNIA 06/29/21   Thayer Headings, PMHNP      Allergies    Erythromycin    Review of Systems   Review of Systems  All other systems reviewed and are negative.   Physical Exam Updated Vital Signs BP (!) 139/101 (BP Location: Right Arm)   Pulse 95   Temp 97.6 F (36.4 C)   Resp 20   LMP 11/13/2015   SpO2 96%  Physical Exam Vitals and nursing note reviewed.  Constitutional:      Appearance: Normal appearance.  HENT:     Head: Normocephalic and atraumatic.     Mouth/Throat:  Comments: Patient has dry and cracking lips, however the remainder of the oral mucosa and oral soft tissues are normal in appearance.  There are no blisters or vesicular appearing lesions. Pulmonary:     Effort: Pulmonary effort is normal.  Skin:    General: Skin is warm and dry.  Neurological:     Mental Status: She is alert.     ED Results / Procedures / Treatments   Labs (all labs ordered are listed, but only abnormal results are displayed) Labs Reviewed - No data to display  EKG None  Radiology No results found.  Procedures Procedures    Medications Ordered in ED Medications  HYDROcodone-acetaminophen (NORCO/VICODIN) 5-325 MG per tablet 2 tablet (has no administration in time range)    ED Course/ Medical Decision  Making/ A&P  Patient advised to apply Vaseline to her lips several times daily.  She will also be prescribed prednisone as she is concerned she is having some sort of allergic reaction.  Final Clinical Impression(s) / ED Diagnoses Final diagnoses:  None    Rx / DC Orders ED Discharge Orders     None         Veryl Speak, MD 06/16/22 2335

## 2022-06-16 NOTE — ED Triage Notes (Signed)
Rash/ red irritated skin around mouth x 2 weeks  Not improved with home intervention Reports that the pain is affecting her sleep

## 2022-06-16 NOTE — Discharge Instructions (Addendum)
Begin taking prednisone as prescribed.  Apply Zovirax ointment as prescribed.  Follow-up with your primary doctor later this week if not improving.

## 2022-07-05 ENCOUNTER — Telehealth: Payer: Self-pay

## 2022-07-05 NOTE — Telephone Encounter (Signed)
Mychart msg sent

## 2022-08-02 ENCOUNTER — Telehealth: Payer: Self-pay | Admitting: Psychiatry

## 2022-08-02 NOTE — Telephone Encounter (Signed)
CMM sent PA renewal request on Rexulti 2.0mg  . PA ends 08/31/22.

## 2022-08-03 NOTE — Telephone Encounter (Signed)
Patient has not had an apt since 12/2020, so no PA will be submitted until pt reestablished with provider.

## 2022-08-23 ENCOUNTER — Emergency Department (HOSPITAL_BASED_OUTPATIENT_CLINIC_OR_DEPARTMENT_OTHER): Payer: Medicaid Other

## 2022-08-23 ENCOUNTER — Encounter (HOSPITAL_BASED_OUTPATIENT_CLINIC_OR_DEPARTMENT_OTHER): Payer: Self-pay

## 2022-08-23 ENCOUNTER — Other Ambulatory Visit: Payer: Self-pay

## 2022-08-23 ENCOUNTER — Emergency Department (HOSPITAL_BASED_OUTPATIENT_CLINIC_OR_DEPARTMENT_OTHER)
Admission: EM | Admit: 2022-08-23 | Discharge: 2022-08-24 | Disposition: A | Discharge status: Home / Self Care | Payer: Medicaid Other | Attending: Emergency Medicine | Admitting: Emergency Medicine

## 2022-08-23 DIAGNOSIS — W01198A Fall on same level from slipping, tripping and stumbling with subsequent striking against other object, initial encounter: Secondary | ICD-10-CM | POA: Insufficient documentation

## 2022-08-23 DIAGNOSIS — S59902A Unspecified injury of left elbow, initial encounter: Secondary | ICD-10-CM | POA: Diagnosis present

## 2022-08-23 DIAGNOSIS — W19XXXA Unspecified fall, initial encounter: Secondary | ICD-10-CM

## 2022-08-23 DIAGNOSIS — I10 Essential (primary) hypertension: Secondary | ICD-10-CM | POA: Diagnosis not present

## 2022-08-23 DIAGNOSIS — S8991XA Unspecified injury of right lower leg, initial encounter: Secondary | ICD-10-CM | POA: Diagnosis not present

## 2022-08-23 DIAGNOSIS — Z79899 Other long term (current) drug therapy: Secondary | ICD-10-CM | POA: Diagnosis not present

## 2022-08-23 NOTE — ED Triage Notes (Signed)
Tripped over a chair,approx 3pm today.  injury to right knee and left elbow with laceration. Patient admits to ETOH. A & OX3. Unknown tdap  denies hitting head.

## 2022-08-24 MED ORDER — CEPHALEXIN 500 MG PO CAPS: 500.0000 mg | ORAL_CAPSULE | Freq: Three times a day (TID) | ORAL | 0 refills | Status: AC

## 2022-08-24 NOTE — ED Provider Notes (Signed)
[place] EMERGENCY DEPARTMENT AT [facility] Provider Note  CSN: [id number] Arrival date & time: 08/23/22 2129  Chief Complaint(s) Fall (Tripped over a chair,approx 3pm today.  injury to right knee and left elbow with laceration. Patient admits to ETOH. A & OX3. Unknown tdap  denies hitting head. )  HPI [name] is a 57 y.o. female presents to the emergency department after a mechanical fall while intoxicated around 3 PM this afternoon.  Patient reports tripping over a chair causing her to fall onto the floor hitting her left elbow and right knee.  She denies hitting her head, loss of consciousness.  Denies any current headache.  No neck pain or back pain.  No chest pain or abdominal pain.  No extremity pain.  The history is provided by the patient.    Past Medical History Past Medical History:  Diagnosis Date   Alcohol dependence (HCC)    Allergy    Depression    Elevated cholesterol    Headache    HTN (hypertension)    Vitamin D deficiency    Patient Active Problem List   Diagnosis Date Noted   Alcohol use disorder, severe, dependence (HCC) 03/30/2019   Adjustment disorder with mixed anxiety and depressed mood 02/10/2018   Insomnia 04/07/2015   Alcohol use disorder, severe, in early remission, dependence (HCC) 08/24/2014   Major depressive disorder, recurrent episode, moderate (HCC) 06/24/2014   Substance induced mood disorder (HCC) 11/20/2013   Home Medication(s) Prior to Admission medications   Medication Sig Start Date End Date Taking? Authorizing Provider  cephALEXin (KEFLEX) 500 MG capsule Take 1 capsule (500 mg total) by mouth 3 (three) times daily for 5 days. 08/24/22 08/29/22 Yes Fabianna Keats, Amadeo Garnet, MD  acyclovir ointment (ZOVIRAX) 5 % Apply 1 Application topically every 3 (three) hours. 06/16/22   Geoffery Lyons, MD  albuterol (VENTOLIN HFA) 108 (90 Base) MCG/ACT inhaler Inhale 1 puff into the lungs every 6 (six) hours as needed for wheezing or  shortness of breath.  07/01/18   [provider]  brexpiprazole (REXULTI) 2 MG TABS tablet Take 1 tablet (2 mg total) by mouth daily. 01/05/21   Corie Chiquito, PMHNP  buPROPion (WELLBUTRIN XL) 150 MG 24 hr tablet Take 1 tablet (150 mg total) by mouth daily. 01/05/21   Corie Chiquito, PMHNP  cloNIDine (CATAPRES) 0.1 MG tablet TAKE 1 TABLET BY MOUTH EVERYDAY AT BEDTIME 01/05/21   Corie Chiquito, PMHNP  cyclobenzaprine (FLEXERIL) 10 MG tablet Take 1 tablet (10 mg total) by mouth 2 (two) times daily as needed for muscle spasms. 05/19/22   Peter Garter, PA  DULoxetine (CYMBALTA) 30 MG capsule Take 1 capsule (30 mg total) by mouth daily. Take with 60 mg capsule to equal total dose of 90 mg 01/05/21   Corie Chiquito, PMHNP  DULoxetine (CYMBALTA) 60 MG capsule Take 1 capsule (60 mg total) by mouth daily. Take with 30 mg capsule to equal total dose of 90 mg 01/05/21   Corie Chiquito, PMHNP  ergocalciferol (VITAMIN D2) 1.25 MG (50000 UT) capsule Take 50,000 Units by mouth once a week. Tuesdays Patient not taking: No sig reported    [provider]  ibuprofen (ADVIL) 800 MG tablet Take 1 tablet (800 mg total) by mouth 3 (three) times daily. 05/19/22   Peter Garter, PA  naproxen sodium (ALEVE) 220 MG tablet Take 220 mg by mouth daily as needed (For pain).     [provider]  predniSONE (DELTASONE) 10 MG tablet Take  2 tablets (20 mg total) by mouth 2 (two) times daily with a meal. 06/16/22   Geoffery Lyons, MD  propranolol (INDERAL) 10 MG tablet TAKE 1 TABLET BY MOUTH TWICE A DAY AS NEEDED FOR ANXIETY 04/12/21   Corie Chiquito, PMHNP  rosuvastatin (CRESTOR) 20 MG tablet Take 20 mg by mouth every evening. 11/10/19   [provider]  traZODone (DESYREL) 100 MG tablet TAKE 1-2 TABLETS AT BEDTIME FOR INSOMNIA 06/29/21   Corie Chiquito, PMHNP                                                                                                                                     Allergies Erythromycin  Review of Systems Review of Systems As noted in HPI  Physical Exam Vital Signs  I have reviewed the triage vital signs BP (!) 136/97   Pulse 94   Temp 97.8 F (36.6 C)   Resp 17   Ht 5\' 2"  (1.575 m)   Wt 75.8 kg   LMP 11/13/2015   SpO2 99%   BMI 30.54 kg/m   Physical Exam Constitutional:      General: She is not in acute distress.    Appearance: She is well-developed. She is not diaphoretic.  HENT:     Head: Normocephalic and atraumatic.     Right Ear: External ear normal.     Left Ear: External ear normal.     Nose: Nose normal.  Eyes:     General: No scleral icterus.       Right eye: No discharge.        Left eye: No discharge.     Conjunctiva/sclera: Conjunctivae normal.     Pupils: Pupils are equal, round, and reactive to light.  Cardiovascular:     Rate and Rhythm: Normal rate and regular rhythm.     Pulses:          Radial pulses are 2+ on the right side and 2+ on the left side.       Dorsalis pedis pulses are 2+ on the right side and 2+ on the left side.     Heart sounds: Normal heart sounds. No murmur heard.    No friction rub. No gallop.  Pulmonary:     Effort: Pulmonary effort is normal. No respiratory distress.     Breath sounds: Normal breath sounds. No stridor. No wheezing.  Abdominal:     General: There is no distension.     Palpations: Abdomen is soft.     Tenderness: There is no abdominal tenderness.  Musculoskeletal:     Left elbow: No lacerations. Normal range of motion. Tenderness present.       Arms:     Cervical back: Normal range of motion and neck supple. No bony tenderness.     Thoracic back: No bony tenderness.     Lumbar back: No bony tenderness.     Right knee: Swelling  and effusion present. No lacerations. Normal range of motion. Tenderness present.       Legs:     Comments: Clavicles stable. Chest stable to AP/Lat compression. Pelvis stable to Lat compression. No obvious extremity deformity. No  chest or abdominal wall contusion.  Skin:    General: Skin is warm and dry.     Findings: No erythema or rash.  Neurological:     Mental Status: She is alert and oriented to person, place, and time.     Comments: Moving all extremities     ED Results and Treatments Labs (all labs ordered are listed, but only abnormal results are displayed) Labs Reviewed - No data to display                                                                                                                       EKG  EKG Interpretation  Date/Time:    Ventricular Rate:    PR Interval:    QRS Duration:   QT Interval:    QTC Calculation:   R Axis:     Text Interpretation:         Radiology DG Elbow Complete Left  Result Date: 08/23/2022 CLINICAL DATA:  Status post trauma. EXAM: LEFT ELBOW - COMPLETE 3+ VIEW COMPARISON:  None Available. FINDINGS: There is no evidence of fracture, dislocation, or joint effusion. There is no evidence of arthropathy or other focal bone abnormality. Mild dorsal soft tissue swelling is noted. IMPRESSION: Mild dorsal soft tissue swelling without evidence of acute fracture or dislocation. Electronically Signed   By: Aram Candela M.D.   On: 08/23/2022 22:33   DG Knee Right Port  Result Date: 08/23/2022 CLINICAL DATA:  Status post fall. EXAM: PORTABLE RIGHT KNEE - 1-2 VIEW COMPARISON:  None Available. FINDINGS: No evidence of acute fracture or dislocation. No evidence of arthropathy or other focal bone abnormality. A small joint effusion is noted. IMPRESSION: Small joint effusion. Electronically Signed   By: Aram Candela M.D.   On: 08/23/2022 22:32    Medications Ordered in ED Medications - No data to display                                                                                                                                   Procedures Procedures  (including critical care time)  Medical Decision Making / ED Course  Click here for ABCD2, HEART and  other calculators  Medical Decision Making Amount and/or Complexity of Data Reviewed Radiology: ordered.  Risk Prescription drug management.    Mechanical fall resulting in left elbow and right knee injuries.   Left elbow deep abrasion not able to closure.  Cleaned and bandaged. Plain film Tane ruling out obvious fracture or dislocation. Will provide prophylactic antibiotic given location prevent infectious bursitis or cellulitis.  Right knee with superficial abrasion Plain film negative for fracture or dislocation.      Final Clinical Impression(s) / ED Diagnoses Final diagnoses:  Fall, initial encounter  Injury of left elbow, initial encounter  Injury of right knee, initial encounter   The patient appears reasonably screened and/or stabilized for discharge and I doubt any other medical condition or other [facility] requiring further screening, evaluation, or treatment in the ED at this time. I have discussed the findings, Dx and Tx plan with the patient/family who expressed understanding and agree(s) with the plan. Discharge instructions discussed at length. The patient/family was given strict return precautions who verbalized understanding of the instructions. No further questions at time of discharge.  Disposition: Discharge  Condition: Good  ED Discharge Orders          Ordered    cephALEXin (KEFLEX) 500 MG capsule  3 times daily        08/24/22 0056                    This chart was dictated using voice recognition software.  Despite best efforts to proofread,  errors can occur which can change the documentation meaning.    Nira Conn, MD 08/24/22 [phone]

## 2022-08-24 NOTE — ED Notes (Signed)
L elbow laceration cleaned and bandaged.

## 2022-08-24 NOTE — ED Notes (Signed)
RN reviewed discharge instructions with pt. Pt verbalized understanding and had no further questions. VSS upon discharge.  

## 2022-11-19 ENCOUNTER — Encounter: Payer: Self-pay | Admitting: Family Medicine

## 2022-11-19 ENCOUNTER — Ambulatory Visit (INDEPENDENT_AMBULATORY_CARE_PROVIDER_SITE_OTHER): Payer: MEDICAID | Admitting: Family Medicine

## 2022-11-19 VITALS — BP 112/64 | HR 86 | Temp 97.8°F | Ht 62.0 in | Wt 148.0 lb

## 2022-11-19 DIAGNOSIS — D179 Benign lipomatous neoplasm, unspecified: Secondary | ICD-10-CM

## 2022-11-19 DIAGNOSIS — F1020 Alcohol dependence, uncomplicated: Secondary | ICD-10-CM | POA: Diagnosis not present

## 2022-11-19 DIAGNOSIS — F4323 Adjustment disorder with mixed anxiety and depressed mood: Secondary | ICD-10-CM | POA: Diagnosis not present

## 2022-11-19 DIAGNOSIS — E559 Vitamin D deficiency, unspecified: Secondary | ICD-10-CM

## 2022-11-19 DIAGNOSIS — Z7689 Persons encountering health services in other specified circumstances: Secondary | ICD-10-CM

## 2022-11-19 DIAGNOSIS — Z114 Encounter for screening for human immunodeficiency virus [HIV]: Secondary | ICD-10-CM

## 2022-11-19 DIAGNOSIS — Z1231 Encounter for screening mammogram for malignant neoplasm of breast: Secondary | ICD-10-CM

## 2022-11-19 DIAGNOSIS — Z124 Encounter for screening for malignant neoplasm of cervix: Secondary | ICD-10-CM

## 2022-11-19 DIAGNOSIS — F331 Major depressive disorder, recurrent, moderate: Secondary | ICD-10-CM | POA: Diagnosis not present

## 2022-11-19 DIAGNOSIS — Z1329 Encounter for screening for other suspected endocrine disorder: Secondary | ICD-10-CM

## 2022-11-19 DIAGNOSIS — Z1211 Encounter for screening for malignant neoplasm of colon: Secondary | ICD-10-CM

## 2022-11-19 DIAGNOSIS — Z1159 Encounter for screening for other viral diseases: Secondary | ICD-10-CM

## 2022-11-19 DIAGNOSIS — Z13220 Encounter for screening for lipoid disorders: Secondary | ICD-10-CM

## 2022-11-19 NOTE — Patient Instructions (Addendum)
-  It was a pleasure to care for you today and look forward to taking care of you. -Ordered a cologuard. -Placed a referral to GYN for pap smear. -Placed a referral to psychiatry for Corie Chiquito, NP with [facility]. May need to call their office to schedule an appointment.  [facility] [address] [place], [place], [place] [address]  [phone] -Ordered a mammogram.  -If you do not hear about these appointment or do not receive cologuard in the mail, please give the office a call. -Scheduled a lab visit when fasting.  -Follow up in 1 month to discuss labs.  -If you decided to stop drinking alcohol, recommend to call behavioral health or go to [facility] or another local emergency department.

## 2022-11-19 NOTE — Progress Notes (Signed)
New Patient Office Visit  Subjective    Patient ID: [name], female    DOB: 31-Jan-1966  Age: 57 y.o. MRN: [id number]  CC:  Chief Complaint  Patient presents with   Establish Care    Pt is here today to Est. Care. Pt is here today here today with C/O of knots on her butt check and rt lower leg Pt is not FASTING     HPI [name] presents to establish care with new provider.   Patients previous primary care provider was Dr. Tracey Harries with [facility]. Last visit was on 10/19/2020.   Specialist: Previously seen Corie Chiquito with [facility]-seen her two years ago, but at that time she was at [facility]. Under the care of anxiety and depression.   Patient is complaining of knots on her butt cheek and right lower leg, that resemble lipomas. Right lower leg, lateral calf, been present at least 2 years and one on left buttock cheek at least 6 months. Stayed the same size. Denies pain.   Outpatient Encounter Medications as of 11/19/2022  Medication Sig   [DISCONTINUED] acyclovir ointment (ZOVIRAX) 5 % Apply 1 Application topically every 3 (three) hours. (Patient not taking: Reported on 11/19/2022)   [DISCONTINUED] albuterol (VENTOLIN HFA) 108 (90 Base) MCG/ACT inhaler Inhale 1 puff into the lungs every 6 (six) hours as needed for wheezing or shortness of breath.  (Patient not taking: Reported on 11/19/2022)   [DISCONTINUED] brexpiprazole (REXULTI) 2 MG TABS tablet Take 1 tablet (2 mg total) by mouth daily. (Patient not taking: Reported on 11/19/2022)   [DISCONTINUED] buPROPion (WELLBUTRIN XL) 150 MG 24 hr tablet Take 1 tablet (150 mg total) by mouth daily. (Patient not taking: Reported on 11/19/2022)   [DISCONTINUED] cloNIDine (CATAPRES) 0.1 MG tablet TAKE 1 TABLET BY MOUTH EVERYDAY AT BEDTIME (Patient not taking: Reported on 11/19/2022)   [DISCONTINUED] cyclobenzaprine (FLEXERIL) 10 MG tablet Take 1 tablet  (10 mg total) by mouth 2 (two) times daily as needed for muscle spasms. (Patient not taking: Reported on 11/19/2022)   [DISCONTINUED] DULoxetine (CYMBALTA) 30 MG capsule Take 1 capsule (30 mg total) by mouth daily. Take with 60 mg capsule to equal total dose of 90 mg (Patient not taking: Reported on 11/19/2022)   [DISCONTINUED] DULoxetine (CYMBALTA) 60 MG capsule Take 1 capsule (60 mg total) by mouth daily. Take with 30 mg capsule to equal total dose of 90 mg (Patient not taking: Reported on 11/19/2022)   [DISCONTINUED] ergocalciferol (VITAMIN D2) 1.25 MG (50000 UT) capsule Take 50,000 Units by mouth once a week. Tuesdays (Patient not taking: Reported on 04/04/2020)   [DISCONTINUED] ibuprofen (ADVIL) 800 MG tablet Take 1 tablet (800 mg total) by mouth 3 (three) times daily. (Patient not taking: Reported on 11/19/2022)   [DISCONTINUED] naproxen sodium (ALEVE) 220 MG tablet Take 220 mg by mouth daily as needed (For pain).  (Patient not taking: Reported on 11/19/2022)   [DISCONTINUED] predniSONE (DELTASONE) 10 MG tablet Take 2 tablets (20 mg total) by mouth 2 (two) times daily with a meal. (Patient not taking: Reported on 11/19/2022)   [DISCONTINUED] propranolol (INDERAL) 10 MG tablet TAKE 1 TABLET BY MOUTH TWICE A DAY AS NEEDED FOR ANXIETY (Patient not taking: Reported on 11/19/2022)   [DISCONTINUED] rosuvastatin (CRESTOR) 20 MG tablet Take 20 mg by mouth every evening. (Patient not taking: Reported on 11/19/2022)   [DISCONTINUED] traZODone (DESYREL) 100 MG tablet TAKE 1-2 TABLETS AT BEDTIME FOR  INSOMNIA (Patient not taking: Reported on 11/19/2022)   No facility-administered encounter medications on file as of 11/19/2022.    Past Medical History:  Diagnosis Date   Alcohol dependence (HCC)    Allergy    Depression    Elevated cholesterol    Headache    HTN (hypertension)    Vitamin D deficiency     Past Surgical History:  Procedure Laterality Date   CESAREAN SECTION     x2   WISDOM TOOTH EXTRACTION       Family History  Problem Relation Age of Onset   Cancer Mother 42       breast (Resolved), bone cancer   Depression Mother    Heart disease Mother    Macular degeneration Mother    COPD Father    Alcohol abuse Sister    Depression Sister    Depression Brother    Diabetes Maternal Grandmother    Heart disease Maternal Grandmother    Stroke Maternal Grandmother    Alcohol abuse Paternal Grandmother    Early death Neg Hx     Social History   Socioeconomic History   Marital status: Widowed    Spouse name: Not on file   Number of children: 2   Years of education: Not on file   Highest education level: Bachelor's degree (e.g., BA, AB, BS)  Occupational History   Occupation: unemployed  Tobacco Use   Smoking status: Never   Smokeless tobacco: Never  Vaping Use   Vaping status: Never Used  Substance and Sexual Activity   Alcohol use: Yes    Comment: Daily-3-4 drinks   Drug use: No   Sexual activity: Yes  Other Topics Concern   Not on file  Social History Narrative   Not on file   Social Determinants of Health   Financial Resource Strain: Not on file  Food Insecurity: No Food Insecurity (11/19/2022)   Hunger Vital Sign    Worried About Running Out of Food in the Last Year: Never true    Ran Out of Food in the Last Year: Never true  Transportation Needs: No Transportation Needs (11/19/2022)   PRAPARE - Administrator, Civil Service (Medical): No    Lack of Transportation (Non-Medical): No  Physical Activity: Insufficiently Active (11/19/2022)   Exercise Vital Sign    Days of Exercise per Week: 7 days    Minutes of Exercise per Session: 10 min  Stress: Stress Concern Present (11/19/2022)   [employer] of Occupational Health - Occupational Stress Questionnaire    Feeling of Stress : Very much  Social Connections: Moderately Integrated (11/19/2022)   Social Connection and Isolation Panel [NHANES]    Frequency of Communication with Friends and Family:  More than three times a week    Frequency of Social Gatherings with Friends and Family: Once a week    Attends Religious Services: More than 4 times per year    Active Member of [employer] or Organizations: No    Attends Engineer, structural: More than 4 times per year    Marital Status: Widowed  Intimate Partner Violence: Not At Risk (11/19/2022)   Humiliation, Afraid, Rape, and Kick questionnaire    Fear of Current or Ex-Partner: No    Emotionally Abused: No    Physically Abused: No    Sexually Abused: No    ROS See HPI above    Objective    BP 112/64   Pulse 86   Temp 97.8 F (36.6  C)   Ht 5\' 2"  (1.575 m)   Wt 148 lb (67.1 kg)   LMP 11/13/2015   SpO2 98%   BMI 27.07 kg/m   Physical Exam Vitals reviewed.  Constitutional:      General: She is not in acute distress.    Appearance: Normal appearance. She is not ill-appearing, toxic-appearing or diaphoretic.  Eyes:     General:        Right eye: No discharge.        Left eye: No discharge.     Conjunctiva/sclera: Conjunctivae normal.  Cardiovascular:     Rate and Rhythm: Normal rate and regular rhythm.     Heart sounds: Normal heart sounds. No murmur heard.    No friction rub. No gallop.  Pulmonary:     Effort: Pulmonary effort is normal. No respiratory distress.     Breath sounds: Normal breath sounds.  Musculoskeletal:        General: Normal range of motion.  Skin:    General: Skin is warm and dry.     Comments: Single moveable, non discolored, lump on right lateral calf. Skin is not red, non scaly skin.   Neurological:     General: No focal deficit present.     Mental Status: She is alert and oriented to person, place, and time. Mental status is at baseline.  Psychiatric:        Mood and Affect: Mood is anxious and depressed. Affect is tearful.        Behavior: Behavior normal. Behavior is cooperative.        Thought Content: Thought content normal.        Judgment: Judgment normal.      Assessment &  Plan:  Major depressive disorder, recurrent episode, moderate (HCC) -     Ambulatory referral to Psychiatry  Adjustment disorder with mixed anxiety and depressed mood -     Ambulatory referral to Psychiatry  Alcohol use disorder, severe, dependence (HCC) -     Vitamin B12; Future -     Vitamin B1; Future -     Ambulatory referral to Psychiatry  Lipoma, unspecified site  Colon cancer screening -     Cologuard  Vitamin D deficiency -     VITAMIN D 25 Hydroxy (Vit-D Deficiency, Fractures); Future  Encounter for screening mammogram for malignant neoplasm of breast -     MM 3D DIAGNOSTIC MAMMOGRAM BILATERAL BREAST  Cervical cancer screening -     Ambulatory referral to Obstetrics / Gynecology  Lipid screening -     Lipid panel; Future  Screening for endocrine, metabolic and immunity disorder -     CBC with Differential/Platelet; Future -     Comprehensive metabolic panel; Future -     Hemoglobin A1c; Future -     TSH; Future  Encounter for screening for HIV -     HIV Antibody (routine testing w rflx); Future  Need for hepatitis C screening test -     Hepatitis C antibody; Future  Encounter to establish care  1.Review health maintenance:  -Covid vaccine/booster-initially had first vaccines; not had booster-verbalized to patient that should could have her booster at her local pharmacy.  -HIV and Hep C screening-order for a future lab drawn -Pap smear -referral to gyn for cervical cancer screening  -Colonoscopy-ordered cologuard for colon cancer screening  -Mammogram- ordered mammogram for breast cancer -Zoster vaccine-recommend to go to local pharmacy-verbalized this information.  2. Ordered screening labs and based on past medical  history. Patient is not fasting today but is suppose to make a lab appointment when she is fasting. At her 1 month follow up, will discuss in more detail about any abnormal findings that need further discussion. 3. Patient scored high on her  ETOH screening. Patient verbalized she is not ready to quit drinking. She reports she has been sober two other times and had great success with going to [facility] and going into inpatient treatment. Advised patient when she was ready, she could go to one of the local EDs to follow this same track. Also, placed a referral to follow back up with Daun Peacock, NP with [facility] for depression, anxiety, and alcohol abuse since she has previously had a professional relationship with the provider. Also, recommend her to call the office to schedule an appointment. She is currently not taking any medication. She didn't voice any SI or HI during visit.  4. Offered to refer patient to dermatology for possible lipomas, but patient declined at this time. She reports they do not bother her and have not changed once they appear. Will be happy to send to a dermatology when she decides to go. She reports she has an enough on her plate with all this.   Return in about 1 month (around 12/20/2022) for follow-up; lab visit when fasting .   Zandra Abts, NP

## 2022-11-20 ENCOUNTER — Other Ambulatory Visit: Payer: MEDICAID

## 2022-11-20 ENCOUNTER — Other Ambulatory Visit (INDEPENDENT_AMBULATORY_CARE_PROVIDER_SITE_OTHER): Payer: MEDICAID

## 2022-11-20 DIAGNOSIS — Z1329 Encounter for screening for other suspected endocrine disorder: Secondary | ICD-10-CM | POA: Diagnosis not present

## 2022-11-20 DIAGNOSIS — Z13228 Encounter for screening for other metabolic disorders: Secondary | ICD-10-CM

## 2022-11-20 DIAGNOSIS — Z130 Encounter for screening for diseases of the blood and blood-forming organs and certain disorders involving the immune mechanism: Secondary | ICD-10-CM | POA: Diagnosis not present

## 2022-11-20 DIAGNOSIS — F1020 Alcohol dependence, uncomplicated: Secondary | ICD-10-CM

## 2022-11-20 DIAGNOSIS — E559 Vitamin D deficiency, unspecified: Secondary | ICD-10-CM

## 2022-11-20 DIAGNOSIS — Z13220 Encounter for screening for lipoid disorders: Secondary | ICD-10-CM

## 2022-11-20 DIAGNOSIS — Z114 Encounter for screening for human immunodeficiency virus [HIV]: Secondary | ICD-10-CM

## 2022-11-20 DIAGNOSIS — Z1159 Encounter for screening for other viral diseases: Secondary | ICD-10-CM

## 2022-11-20 LAB — VITAMIN D 25 HYDROXY (VIT D DEFICIENCY, FRACTURES): VITD: 72.77 ng/mL (ref 30.00–100.00)

## 2022-11-20 LAB — COMPREHENSIVE METABOLIC PANEL
ALT: 161 U/L — ABNORMAL HIGH (ref 0–35)
AST: 414 U/L — ABNORMAL HIGH (ref 0–37)
Albumin: 4.4 g/dL (ref 3.5–5.2)
Alkaline Phosphatase: 96 U/L (ref 39–117)
BUN: 7 mg/dL (ref 6–23)
CO2: 23 mEq/L (ref 19–32)
Calcium: 9.5 mg/dL (ref 8.4–10.5)
Chloride: 98 mEq/L (ref 96–112)
Creatinine, Ser: 0.65 mg/dL (ref 0.40–1.20)
GFR: 99.4 mL/min (ref >60.00)
Glucose, Bld: 117 mg/dL — ABNORMAL HIGH (ref 70–99)
Potassium: 3.7 mEq/L (ref 3.5–5.1)
Sodium: 142 mEq/L (ref 135–145)
Total Bilirubin: 1.9 mg/dL — ABNORMAL HIGH (ref 0.2–1.2)
Total Protein: 8 g/dL (ref 6.0–8.3)

## 2022-11-20 LAB — CBC WITH DIFFERENTIAL/PLATELET
Basophils Absolute: 0.1 10*3/uL (ref 0.0–0.1)
Basophils Relative: 1.4 % (ref 0.0–3.0)
Eosinophils Absolute: 0 10*3/uL (ref 0.0–0.7)
Eosinophils Relative: 0.3 % (ref 0.0–5.0)
HCT: 43.6 % (ref 36.0–46.0)
Hemoglobin: 14.2 g/dL (ref 12.0–15.0)
Lymphocytes Relative: 28.1 % (ref 12.0–46.0)
Lymphs Abs: 1.8 10*3/uL (ref 0.7–4.0)
MCHC: 32.5 g/dL (ref 30.0–36.0)
MCV: 104.8 fl — ABNORMAL HIGH (ref 78.0–100.0)
Monocytes Absolute: 0.6 10*3/uL (ref 0.1–1.0)
Monocytes Relative: 8.9 % (ref 3.0–12.0)
Neutro Abs: 4 10*3/uL (ref 1.4–7.7)
Neutrophils Relative %: 61.3 % (ref 43.0–77.0)
Platelets: 302 10*3/uL (ref 150.0–400.0)
RBC: 4.16 Mil/uL (ref 3.87–5.11)
RDW: 16.1 % — ABNORMAL HIGH (ref 11.5–15.5)
WBC: 6.5 10*3/uL (ref 4.0–10.5)

## 2022-11-20 LAB — LIPID PANEL
Cholesterol: 317 mg/dL — ABNORMAL HIGH (ref 0–200)
HDL: 148.4 mg/dL (ref >39.00)
LDL Cholesterol: 150 mg/dL — ABNORMAL HIGH (ref 0–99)
NonHDL: 168.56
Total CHOL/HDL Ratio: 2
Triglycerides: 92 mg/dL (ref 0.0–149.0)
VLDL: 18.4 mg/dL (ref 0.0–40.0)

## 2022-11-20 LAB — HEMOGLOBIN A1C: Hgb A1c MFr Bld: 4.8 % (ref 4.6–6.5)

## 2022-11-20 LAB — VITAMIN B12: Vitamin B-12: 1009 pg/mL — ABNORMAL HIGH (ref 211–911)

## 2022-11-20 LAB — TSH: TSH: 0.69 u[IU]/mL (ref 0.35–5.50)

## 2022-11-21 ENCOUNTER — Telehealth: Payer: Self-pay

## 2022-11-21 LAB — HEPATITIS C ANTIBODY: Hepatitis C Ab: NONREACTIVE

## 2022-11-21 LAB — HIV ANTIBODY (ROUTINE TESTING W REFLEX): HIV 1&2 Ab, 4th Generation: NONREACTIVE

## 2022-11-21 NOTE — Telephone Encounter (Signed)
-----   Message from Zandra Abts sent at 11/21/2022  4:11 PM EDT ----- HIV and Hep C are non reactive.

## 2022-11-21 NOTE — Telephone Encounter (Signed)
-----   Message from Zandra Abts sent at 11/20/2022  4:23 PM EDT ----- Your vitamin B12 is elevated, no data suggest that it is harmful. You just urinate the additional vitamin B12. Your bilirubin, ALT, and AST are significantly elevated, these are liver enzymes. Recommend a right upper quad ultrasound. (Needs right upper quad ultrasound; dx of elevated liver enzymes) Recommend to stop drinking ETOH and limit Tylenol use to 3000mg  daily if needed. Your total cholesterol and LDL (bad) cholesterol is elevated, recommend diet and exercise changes- increase intake of fresh fruits and vegetables, increase intake of lean proteins. Bake, broil, or grill foods. Avoid fried, greasy, and fatty foods. Avoid fast foods. Increase intake of fiber-rich whole grains. Exercise encouraged - at least 150 minutes per week and advance as tolerated. All other labs are stable.

## 2022-11-21 NOTE — Telephone Encounter (Signed)
Left vm to call office

## 2022-11-22 NOTE — Telephone Encounter (Signed)
LM to call the office about lab results

## 2022-11-22 NOTE — Telephone Encounter (Signed)
Called pt no answer, LM to call back to discuss labs

## 2022-11-23 NOTE — Telephone Encounter (Signed)
Additional lab results:    HIV and Hep C are non reactive.    Called and LM to have patient  call back to discuss labs

## 2022-11-26 NOTE — Telephone Encounter (Signed)
Pt does not have permit address at this time.

## 2022-11-26 NOTE — Telephone Encounter (Signed)
Voice mail is full  

## 2022-11-26 NOTE — Telephone Encounter (Signed)
Letter sent to address on file

## 2022-11-27 ENCOUNTER — Telehealth: Payer: Self-pay

## 2022-11-27 NOTE — Telephone Encounter (Signed)
-----   Message from Zandra Abts sent at 11/27/2022  8:44 AM EDT ----- Vitamin B1, thiamine, came back normal.

## 2022-11-28 ENCOUNTER — Encounter (INDEPENDENT_AMBULATORY_CARE_PROVIDER_SITE_OTHER): Payer: Self-pay

## 2022-11-30 NOTE — Progress Notes (Signed)
Letter out to patient.

## 2022-12-17 ENCOUNTER — Telehealth: Payer: Self-pay

## 2022-12-17 ENCOUNTER — Other Ambulatory Visit: Payer: Self-pay | Admitting: Family Medicine

## 2022-12-17 DIAGNOSIS — R748 Abnormal levels of other serum enzymes: Secondary | ICD-10-CM

## 2022-12-17 DIAGNOSIS — F1021 Alcohol dependence, in remission: Secondary | ICD-10-CM

## 2022-12-17 NOTE — Telephone Encounter (Signed)
Pt agrees to Ultrasound

## 2022-12-17 NOTE — Telephone Encounter (Signed)
Pt prefers [facility]

## 2022-12-17 NOTE — Progress Notes (Signed)
Placed an order for an ultrasound of the liver since she had elevated liver enzymes on past CMP and drinks alcohol on regular basis.

## 2022-12-18 ENCOUNTER — Ambulatory Visit (HOSPITAL_BASED_OUTPATIENT_CLINIC_OR_DEPARTMENT_OTHER)
Admission: RE | Admit: 2022-12-18 | Discharge: 2022-12-18 | Disposition: A | Discharge status: Home / Self Care | Payer: MEDICAID | Source: Ambulatory Visit | Attending: Family Medicine | Admitting: Family Medicine

## 2022-12-18 DIAGNOSIS — R748 Abnormal levels of other serum enzymes: Secondary | ICD-10-CM

## 2022-12-18 DIAGNOSIS — F1021 Alcohol dependence, in remission: Secondary | ICD-10-CM

## 2022-12-18 NOTE — Telephone Encounter (Signed)
Left vm

## 2022-12-20 ENCOUNTER — Ambulatory Visit (HOSPITAL_BASED_OUTPATIENT_CLINIC_OR_DEPARTMENT_OTHER)
Admission: RE | Admit: 2022-12-20 | Discharge: 2022-12-20 | Disposition: A | Discharge status: Home / Self Care | Payer: MEDICAID | Source: Ambulatory Visit | Attending: Family Medicine | Admitting: Family Medicine

## 2022-12-20 DIAGNOSIS — R748 Abnormal levels of other serum enzymes: Secondary | ICD-10-CM | POA: Insufficient documentation

## 2022-12-20 DIAGNOSIS — F1021 Alcohol dependence, in remission: Secondary | ICD-10-CM | POA: Insufficient documentation

## 2022-12-21 ENCOUNTER — Other Ambulatory Visit: Payer: Self-pay

## 2022-12-21 ENCOUNTER — Telehealth: Payer: Self-pay

## 2022-12-21 DIAGNOSIS — R932 Abnormal findings on diagnostic imaging of liver and biliary tract: Secondary | ICD-10-CM

## 2022-12-21 NOTE — Telephone Encounter (Signed)
-----   Message from Zandra Abts sent at 12/20/2022  2:12 PM EDT ----- No acute findings found on your liver. However, there it appears you have liver damage. Strongly recommend to decrease and hopefully soon stop drinking alcohol. Recommend to have a referral to a liver specialist. (Referral to [facility]; Dx: R93.2)

## 2022-12-21 NOTE — Telephone Encounter (Signed)
I have informed pt of results and placed the referral to Hepatology to [facility]

## 2022-12-21 NOTE — Telephone Encounter (Signed)
I have spoke to pt and placed referral

## 2022-12-24 ENCOUNTER — Ambulatory Visit: Payer: MEDICAID | Admitting: Family Medicine

## 2022-12-25 ENCOUNTER — Encounter: Payer: Self-pay | Admitting: Family Medicine

## 2022-12-25 NOTE — Telephone Encounter (Signed)
Called pt and schedule her appt.

## 2022-12-26 NOTE — Progress Notes (Unsigned)
   Established Patient Office Visit   Subjective:  Patient ID: ROSABELLE JAGERS, female    DOB: 1965-11-09  Age: 57 y.o. MRN: [id number]  No chief complaint on file.   HPI Patient is present for a 1 month follow up from previous visit on 11/19/2022.   On previous visit patient was referred to [facility] in [place] for depression, adjustment disorder with mixed anxiety, and alcohol use disorder.   On previous visit, cologuard and mammogram order was placed.   On previous visit, patient was referred to [facility] for cervical cancer screening.   On previous labs, her bilirubin, ALT, and AST are significantly elevated and was recommended to have an ultrasound right upper quadrant. Patient had ultrasound that showed no acute findings, but it appears she has liver damage. Recommended a referral to [facility] for further care.   ROS See HPI above     Objective:     LMP 11/13/2015  {Vitals History (Optional):23777}  Physical Exam  No results found for any visits on 12/27/22.  The ASCVD Risk score (Arnett DK, et al., 2019) failed to calculate for the following reasons:   The valid HDL cholesterol range is 20 to 100 mg/dL    Assessment & Plan:  There are no diagnoses linked to this encounter.  No follow-ups on file.   Zandra Abts, NP

## 2022-12-27 ENCOUNTER — Ambulatory Visit (INDEPENDENT_AMBULATORY_CARE_PROVIDER_SITE_OTHER): Payer: MEDICAID | Admitting: Family Medicine

## 2022-12-27 ENCOUNTER — Encounter: Payer: Self-pay | Admitting: Family Medicine

## 2022-12-27 VITALS — BP 102/64 | HR 91 | Temp 98.0°F | Ht 62.0 in | Wt 144.0 lb

## 2022-12-27 DIAGNOSIS — F331 Major depressive disorder, recurrent, moderate: Secondary | ICD-10-CM

## 2022-12-27 DIAGNOSIS — R932 Abnormal findings on diagnostic imaging of liver and biliary tract: Secondary | ICD-10-CM | POA: Diagnosis not present

## 2022-12-27 DIAGNOSIS — F1020 Alcohol dependence, uncomplicated: Secondary | ICD-10-CM | POA: Diagnosis not present

## 2022-12-27 DIAGNOSIS — F1021 Alcohol dependence, in remission: Secondary | ICD-10-CM

## 2022-12-27 DIAGNOSIS — F4323 Adjustment disorder with mixed anxiety and depressed mood: Secondary | ICD-10-CM | POA: Diagnosis not present

## 2022-12-27 NOTE — Patient Instructions (Addendum)
-  Recommend to call the following specialist to make an appointment:  [facility]  Counseling & mental health in [place], [place] [address] [place], [place], [place] [address]  [phone]  [facility] for [facility]  [address], [place] [address]  [phone]  [place] [facility] for mammogram  [phone]   [facility] [place] [address]  [place], [place] [address]  [phone]   Recommend to decrease or stop alcohol use.  Reviewed and discussed about previous labs. You are not diabetic or prediabetic. However, your cholesterol is elevated. Concerned about starting a statin to help with your cholesterol until you can be seen by liver specialist. If you would like, we can send you to cardiology. If not, recommend diet and exercise changes and recheck your lipids at your next 3 month appointment. Please be fasting at your next 3 month appointment.

## 2023-01-03 ENCOUNTER — Ambulatory Visit (HOSPITAL_BASED_OUTPATIENT_CLINIC_OR_DEPARTMENT_OTHER)
Admission: RE | Admit: 2023-01-03 | Discharge: 2023-01-03 | Disposition: A | Discharge status: Home / Self Care | Payer: MEDICAID | Source: Ambulatory Visit | Attending: Family Medicine | Admitting: Family Medicine

## 2023-01-03 DIAGNOSIS — Z1231 Encounter for screening mammogram for malignant neoplasm of breast: Secondary | ICD-10-CM | POA: Insufficient documentation

## 2023-01-04 ENCOUNTER — Telehealth: Payer: Self-pay

## 2023-01-04 NOTE — Telephone Encounter (Signed)
-----   Message from Zandra Abts sent at 01/04/2023  7:40 AM EDT ----- No suspicious finding for malignancy on your mammogram. Recommend a re screening in one year.

## 2023-01-12 ENCOUNTER — Emergency Department (HOSPITAL_COMMUNITY): Payer: MEDICAID

## 2023-01-12 ENCOUNTER — Emergency Department (HOSPITAL_COMMUNITY)
Admission: EM | Admit: 2023-01-12 | Discharge: 2023-01-13 | Disposition: A | Discharge status: Home / Self Care | Payer: MEDICAID | Attending: Emergency Medicine | Admitting: Emergency Medicine

## 2023-01-12 DIAGNOSIS — K709 Alcoholic liver disease, unspecified: Secondary | ICD-10-CM

## 2023-01-12 DIAGNOSIS — S0093XA Contusion of unspecified part of head, initial encounter: Secondary | ICD-10-CM | POA: Insufficient documentation

## 2023-01-12 DIAGNOSIS — E876 Hypokalemia: Secondary | ICD-10-CM

## 2023-01-12 DIAGNOSIS — S0990XA Unspecified injury of head, initial encounter: Secondary | ICD-10-CM | POA: Diagnosis present

## 2023-01-12 DIAGNOSIS — F1020 Alcohol dependence, uncomplicated: Secondary | ICD-10-CM | POA: Insufficient documentation

## 2023-01-12 DIAGNOSIS — W010XXA Fall on same level from slipping, tripping and stumbling without subsequent striking against object, initial encounter: Secondary | ICD-10-CM | POA: Insufficient documentation

## 2023-01-12 DIAGNOSIS — Y908 Blood alcohol level of 240 mg/100 ml or more: Secondary | ICD-10-CM | POA: Insufficient documentation

## 2023-01-12 DIAGNOSIS — Z23 Encounter for immunization: Secondary | ICD-10-CM | POA: Diagnosis not present

## 2023-01-12 DIAGNOSIS — F10920 Alcohol use, unspecified with intoxication, uncomplicated: Secondary | ICD-10-CM

## 2023-01-12 LAB — COMPREHENSIVE METABOLIC PANEL
ALT: 83 U/L — ABNORMAL HIGH (ref 0–44)
AST: 221 U/L — ABNORMAL HIGH (ref 15–41)
Albumin: 3.3 g/dL — ABNORMAL LOW (ref 3.5–5.0)
Alkaline Phosphatase: 82 U/L (ref 38–126)
Anion gap: 16 — ABNORMAL HIGH (ref 5–15)
BUN: <5 mg/dL — ABNORMAL LOW (ref 6–20)
CO2: 18 mmol/L — ABNORMAL LOW (ref 22–32)
Calcium: 8.2 mg/dL — ABNORMAL LOW (ref 8.9–10.3)
Chloride: 112 mmol/L — ABNORMAL HIGH (ref 98–111)
Creatinine, Ser: 0.68 mg/dL (ref 0.44–1.00)
GFR, Estimated: >60 mL/min (ref >60)
Glucose, Bld: 79 mg/dL (ref 70–99)
Potassium: 3.3 mmol/L — ABNORMAL LOW (ref 3.5–5.1)
Sodium: 146 mmol/L — ABNORMAL HIGH (ref 135–145)
Total Bilirubin: 1.1 mg/dL (ref 0.3–1.2)
Total Protein: 6.6 g/dL (ref 6.5–8.1)

## 2023-01-12 LAB — CBC
HCT: 41.7 % (ref 36.0–46.0)
Hemoglobin: 13.7 g/dL (ref 12.0–15.0)
MCH: 35.4 pg — ABNORMAL HIGH (ref 26.0–34.0)
MCHC: 32.9 g/dL (ref 30.0–36.0)
MCV: 107.8 fL — ABNORMAL HIGH (ref 80.0–100.0)
Platelets: 219 10*3/uL (ref 150–400)
RBC: 3.87 MIL/uL (ref 3.87–5.11)
RDW: 14.2 % (ref 11.5–15.5)
WBC: 5 10*3/uL (ref 4.0–10.5)
nRBC: 0 % (ref 0.0–0.2)

## 2023-01-12 LAB — ETHANOL: Alcohol, Ethyl (B): 431 mg/dL (ref <10)

## 2023-01-12 MED ORDER — POTASSIUM CHLORIDE CRYS ER 20 MEQ PO TBCR
40.0000 meq | EXTENDED_RELEASE_TABLET | Freq: Once | ORAL | Status: AC
  Administered 2023-01-13: 40 meq via ORAL
  Filled 2023-01-12: qty 2

## 2023-01-12 MED ORDER — TETANUS-DIPHTH-ACELL PERTUSSIS 5-2.5-18.5 LF-MCG/0.5 IM SUSY
0.5000 mL | PREFILLED_SYRINGE | Freq: Once | INTRAMUSCULAR | Status: AC
  Administered 2023-01-12: 0.5 mL via INTRAMUSCULAR
  Filled 2023-01-12: qty 0.5

## 2023-01-12 MED ORDER — CHLORDIAZEPOXIDE HCL 25 MG PO CAPS: ORAL_CAPSULE | ORAL | 0 refills | Status: AC

## 2023-01-12 MED ORDER — LACTATED RINGERS IV BOLUS
1000.0000 mL | Freq: Once | INTRAVENOUS | Status: AC
  Administered 2023-01-12: 1000 mL via INTRAVENOUS

## 2023-01-12 NOTE — ED Provider Notes (Signed)
[place] [facility]ERGENC[facility] DEPARTMENT AT [facility] Provider Note   CSN: [id number] Arrival date & time: 01/12/23  1717     History  Chief Complaint  Patient presents with   Fall   Alcohol Intoxication    [name] is a 57 y.o. female.  Pt presents via EMS s/p fall from home. Reports etoh use earlier today. Mechanical fall, indicates tripped. Denies faintness or dizziness prior to fall. No loc. Large contusion to forehead. Pt limited historian. Small abrasion to forehead, last tetanus unknown. Denies severe headache. No neck/back pain. No chest pain or sob. No abd pain or nv. Denies other extremity pain or injury.   The history is provided by the patient, medical records and the EMS personnel. The history is limited by the condition of the patient.  Fall Pertinent negatives include no chest pain, no abdominal pain, no headaches and no shortness of breath.  Alcohol Intoxication Pertinent negatives include no chest pain, no abdominal pain, no headaches and no shortness of breath.       Home Medications Prior to Admission medications   Not on File      Allergies    Erythromycin    Review of Systems   Review of Systems  Constitutional:  Negative for fever.  HENT:  Negative for nosebleeds.   Eyes:  Negative for pain, redness and visual disturbance.  Respiratory:  Negative for shortness of breath.   Cardiovascular:  Negative for chest pain.  Gastrointestinal:  Negative for abdominal pain, nausea and vomiting.  Genitourinary:  Negative for flank pain.  Musculoskeletal:  Negative for back pain and neck pain.  Skin:  Negative for rash.  Neurological:  Negative for weakness, numbness and headaches.  Hematological:  Does not bruise/bleed easily.    Physical Exam Updated Vital Signs BP 119/84 (BP Location: Right Arm)   Pulse 86   Temp 97.6 F (36.4 C) (Oral)   Resp 20   LMP 11/13/2015   SpO2 100%  Physical Exam Vitals and nursing note reviewed.   Constitutional:      Appearance: Normal appearance. She is well-developed.  HENT:     Head:     Comments: Large contusion to forehead. Small, superficial abrasion to forehead.     Nose: Nose normal.     Mouth/Throat:     Mouth: Mucous membranes are moist.  Eyes:     General: No scleral icterus.    Extraocular Movements: Extraocular movements intact.     Conjunctiva/sclera: Conjunctivae normal.     Pupils: Pupils are equal, round, and reactive to light.  Neck:     Trachea: No tracheal deviation.  Cardiovascular:     Rate and Rhythm: Normal rate and regular rhythm.     Pulses: Normal pulses.     Heart sounds: Normal heart sounds. No murmur heard.    No friction rub. No gallop.  Pulmonary:     Effort: Pulmonary effort is normal. No respiratory distress.     Breath sounds: Normal breath sounds.  Chest:     Chest wall: No tenderness.  Abdominal:     General: Bowel sounds are normal. There is no distension.     Palpations: Abdomen is soft.     Tenderness: There is no abdominal tenderness. There is no guarding.     Comments: No abd contusion or bruising noted.   Genitourinary:    Comments: No cva tenderness.  Musculoskeletal:        General: No swelling.  Cervical back: Normal range of motion and neck supple. No rigidity or tenderness. No muscular tenderness.     Comments: CTLS spine, non tender, aligned, no step off. Good rom bil extremities without pain or focal bony tenderness.   Skin:    General: Skin is warm and dry.     Findings: No rash.  Neurological:     Mental Status: She is alert.     Comments: Alert, speech normal. Gcs 15. Motor/sens grossly intact bil.      ED Results / Procedures / Treatments   Labs (all labs ordered are listed, but only abnormal results are displayed) Results for orders placed or performed during the hospital encounter of 01/12/23  CBC  Result Value Ref Range   WBC 5.0 4.0 - 10.5 K/uL   RBC 3.87 3.87 - 5.11 MIL/uL   Hemoglobin 13.7  12.0 - 15.0 g/dL   HCT 21.3 08.6 - 57.8 %   MCV 107.8 (H) 80.0 - 100.0 fL   MCH 35.4 (H) 26.0 - 34.0 pg   MCHC 32.9 30.0 - 36.0 g/dL   RDW 46.9 62.9 - 52.8 %   Platelets 219 150 - 400 K/uL   nRBC 0.0 0.0 - 0.2 %  Comprehensive metabolic panel  Result Value Ref Range   Sodium 146 (H) 135 - 145 mmol/L   Potassium 3.3 (L) 3.5 - 5.1 mmol/L   Chloride 112 (H) 98 - 111 mmol/L   CO2 18 (L) 22 - 32 mmol/L   Glucose, Bld 79 70 - 99 mg/dL   BUN <5 (L) 6 - 20 mg/dL   Creatinine, Ser 4.13 0.44 - 1.00 mg/dL   Calcium 8.2 (L) 8.9 - 10.3 mg/dL   Total Protein 6.6 6.5 - 8.1 g/dL   Albumin 3.3 (L) 3.5 - 5.0 g/dL   AST 244 (H) 15 - 41 U/L   ALT 83 (H) 0 - 44 U/L   Alkaline Phosphatase 82 38 - 126 U/L   Total Bilirubin 1.1 0.3 - 1.2 mg/dL   GFR, Estimated >01 >02 mL/min   Anion gap 16 (H) 5 - 15  Ethanol  Result Value Ref Range   Alcohol, Ethyl (B) 431 (HH) <10 mg/dL   CT Head Wo Contrast  Result Date: 01/12/2023 CLINICAL DATA:  Head and neck trauma, pain EXAM: CT HEAD WITHOUT CONTRAST CT CERVICAL SPINE WITHOUT CONTRAST TECHNIQUE: Multidetector CT imaging of the head and cervical spine was performed following the standard protocol without intravenous contrast. Multiplanar CT image reconstructions of the cervical spine were also generated. RADIATION DOSE REDUCTION: This exam was performed according to the departmental dose-optimization program which includes automated exposure control, adjustment of the mA and/or kV according to patient size and/or use of iterative reconstruction technique. COMPARISON:  None Available. FINDINGS: CT HEAD FINDINGS Brain: No evidence of acute infarction, hemorrhage, hydrocephalus, extra-axial collection or mass lesion/mass effect. Vascular: No hyperdense vessel or unexpected calcification. Skull: Normal. Negative for fracture or focal lesion. Sinuses/Orbits: No acute finding. Other: Soft tissue contusion and hematoma of the right forehead. CT CERVICAL SPINE FINDINGS  Alignment: Straightening of the normal cervical lordosis. Skull base and vertebrae: No acute fracture. No primary bone lesion or focal pathologic process. Soft tissues and spinal canal: No prevertebral fluid or swelling. No visible canal hematoma. Disc levels: Moderate disc space height loss and osteophytosis of C5-C7. Upper chest: Negative. Other: None. IMPRESSION: 1. No acute intracranial pathology. 2. Soft tissue contusion and hematoma of the right forehead. 3. No fracture or static subluxation of  the cervical spine. 4. Moderate disc space height loss and osteophytosis of C5-C7. Electronically Signed   By: Jearld Lesch M.D.   On: 01/12/2023 18:05   CT Cervical Spine Wo Contrast  Result Date: 01/12/2023 CLINICAL DATA:  Head and neck trauma, pain EXAM: CT HEAD WITHOUT CONTRAST CT CERVICAL SPINE WITHOUT CONTRAST TECHNIQUE: Multidetector CT imaging of the head and cervical spine was performed following the standard protocol without intravenous contrast. Multiplanar CT image reconstructions of the cervical spine were also generated. RADIATION DOSE REDUCTION: This exam was performed according to the departmental dose-optimization program which includes automated exposure control, adjustment of the mA and/or kV according to patient size and/or use of iterative reconstruction technique. COMPARISON:  None Available. FINDINGS: CT HEAD FINDINGS Brain: No evidence of acute infarction, hemorrhage, hydrocephalus, extra-axial collection or mass lesion/mass effect. Vascular: No hyperdense vessel or unexpected calcification. Skull: Normal. Negative for fracture or focal lesion. Sinuses/Orbits: No acute finding. Other: Soft tissue contusion and hematoma of the right forehead. CT CERVICAL SPINE FINDINGS Alignment: Straightening of the normal cervical lordosis. Skull base and vertebrae: No acute fracture. No primary bone lesion or focal pathologic process. Soft tissues and spinal canal: No prevertebral fluid or swelling. No  visible canal hematoma. Disc levels: Moderate disc space height loss and osteophytosis of C5-C7. Upper chest: Negative. Other: None. IMPRESSION: 1. No acute intracranial pathology. 2. Soft tissue contusion and hematoma of the right forehead. 3. No fracture or static subluxation of the cervical spine. 4. Moderate disc space height loss and osteophytosis of C5-C7. Electronically Signed   By: Jearld Lesch M.D.   On: 01/12/2023 18:05   MM 3D SCREENING MAMMOGRAM BILATERAL BREAST  Result Date: 01/03/2023 CLINICAL DATA:  Screening. EXAM: DIGITAL SCREENING BILATERAL MAMMOGRAM WITH TOMOSYNTHESIS AND CAD TECHNIQUE: Bilateral screening digital craniocaudal and mediolateral oblique mammograms were obtained. Bilateral screening digital breast tomosynthesis was performed. The images were evaluated with computer-aided detection. COMPARISON:  None available. ACR Breast Density Category b: There are scattered areas of fibroglandular density. FINDINGS: There are no findings suspicious for malignancy. IMPRESSION: No mammographic evidence of malignancy. A result letter of this screening mammogram will be mailed directly to the patient. RECOMMENDATION: Screening mammogram in one year. (Code:SM-B-01Y) BI-RADS CATEGORY  1: Negative. Electronically Signed   By: Harmon Pier M.D.   On: 01/03/2023 17:08   US Abdomen Limited RUQ (LIVER/GB)  Result Date: 12/20/2022 CLINICAL DATA:  Elevated liver enzymes. EXAM: ULTRASOUND ABDOMEN LIMITED RIGHT UPPER QUADRANT COMPARISON:  None Available. FINDINGS: Gallbladder: No gallstones or wall thickening visualized. No sonographic Murphy sign noted by sonographer. Common bile duct: Diameter: 2 mm Liver: Increased echotexture. No focal liver lesion identified. Portal vein is patent on color Doppler imaging with normal direction of blood flow towards the liver. Other: None. IMPRESSION: 1. No acute abnormality identified. 2. Increased echotexture of the liver. This is a nonspecific finding but may  represent hepatic steatosis. Electronically Signed   By: Sherian Rein M.D.   On: 12/20/2022 08:49     EKG None  Radiology CT Head Wo Contrast  Result Date: 01/12/2023 CLINICAL DATA:  Head and neck trauma, pain EXAM: CT HEAD WITHOUT CONTRAST CT CERVICAL SPINE WITHOUT CONTRAST TECHNIQUE: Multidetector CT imaging of the head and cervical spine was performed following the standard protocol without intravenous contrast. Multiplanar CT image reconstructions of the cervical spine were also generated. RADIATION DOSE REDUCTION: This exam was performed according to the departmental dose-optimization program which includes automated exposure control, adjustment of the mA and/or kV according  to patient size and/or use of iterative reconstruction technique. COMPARISON:  None Available. FINDINGS: CT HEAD FINDINGS Brain: No evidence of acute infarction, hemorrhage, hydrocephalus, extra-axial collection or mass lesion/mass effect. Vascular: No hyperdense vessel or unexpected calcification. Skull: Normal. Negative for fracture or focal lesion. Sinuses/Orbits: No acute finding. Other: Soft tissue contusion and hematoma of the right forehead. CT CERVICAL SPINE FINDINGS Alignment: Straightening of the normal cervical lordosis. Skull base and vertebrae: No acute fracture. No primary bone lesion or focal pathologic process. Soft tissues and spinal canal: No prevertebral fluid or swelling. No visible canal hematoma. Disc levels: Moderate disc space height loss and osteophytosis of C5-C7. Upper chest: Negative. Other: None. IMPRESSION: 1. No acute intracranial pathology. 2. Soft tissue contusion and hematoma of the right forehead. 3. No fracture or static subluxation of the cervical spine. 4. Moderate disc space height loss and osteophytosis of C5-C7. Electronically Signed   By: Jearld Lesch M.D.   On: 01/12/2023 18:05   CT Cervical Spine Wo Contrast  Result Date: 01/12/2023 CLINICAL DATA:  Head and neck trauma, pain EXAM:  CT HEAD WITHOUT CONTRAST CT CERVICAL SPINE WITHOUT CONTRAST TECHNIQUE: Multidetector CT imaging of the head and cervical spine was performed following the standard protocol without intravenous contrast. Multiplanar CT image reconstructions of the cervical spine were also generated. RADIATION DOSE REDUCTION: This exam was performed according to the departmental dose-optimization program which includes automated exposure control, adjustment of the mA and/or kV according to patient size and/or use of iterative reconstruction technique. COMPARISON:  None Available. FINDINGS: CT HEAD FINDINGS Brain: No evidence of acute infarction, hemorrhage, hydrocephalus, extra-axial collection or mass lesion/mass effect. Vascular: No hyperdense vessel or unexpected calcification. Skull: Normal. Negative for fracture or focal lesion. Sinuses/Orbits: No acute finding. Other: Soft tissue contusion and hematoma of the right forehead. CT CERVICAL SPINE FINDINGS Alignment: Straightening of the normal cervical lordosis. Skull base and vertebrae: No acute fracture. No primary bone lesion or focal pathologic process. Soft tissues and spinal canal: No prevertebral fluid or swelling. No visible canal hematoma. Disc levels: Moderate disc space height loss and osteophytosis of C5-C7. Upper chest: Negative. Other: None. IMPRESSION: 1. No acute intracranial pathology. 2. Soft tissue contusion and hematoma of the right forehead. 3. No fracture or static subluxation of the cervical spine. 4. Moderate disc space height loss and osteophytosis of C5-C7. Electronically Signed   By: Jearld Lesch M.D.   On: 01/12/2023 18:05    Procedures Procedures    Medications Ordered in ED Medications  potassium chloride SA (KLOR-CON M) CR tablet 40 mEq (has no administration in time range)  Tdap (BOOSTRIX) injection 0.5 mL (0.5 mLs Intramuscular Given 01/12/23 2000)  lactated ringers bolus 1,000 mL (0 mLs Intravenous Stopped 01/12/23 2151)    ED Course/  Medical Decision Making/ A&P                                 Medical Decision Making Problems Addressed: Acute alcoholic intoxication without complication [facility]): acute illness or injury with systemic symptoms that poses a threat to life or bodily functions Alcoholic liver disease (HCC): chronic illness or injury with exacerbation, progression, or side effects of treatment that poses a threat to life or bodily functions Chronic alcoholism (HCC): chronic illness or injury with exacerbation, progression, or side effects of treatment that poses a threat to life or bodily functions Contusion of head, initial encounter: acute illness or injury with systemic symptoms  that poses a threat to life or bodily functions Fall from slip, trip, or stumble, initial encounter: acute illness or injury with systemic symptoms that poses a threat to life or bodily functions Hypokalemia: acute illness or injury  Amount and/or Complexity of Data Reviewed Independent Historian: parent and EMS    Details: hx External Data Reviewed: notes. Labs: ordered. Decision-making details documented in ED Course. Radiology: ordered and independent interpretation performed. Decision-making details documented in ED Course.  Risk Prescription drug management. Decision regarding hospitalization.   Iv ns. Continuous pulse ox and cardiac monitoring. Imaging ordered.   Differential diagnosis includes head injury/sdh, fx, etoh intoxication, etc. Dispo decision including potential need for admission considered - will get imaging and reassess.   Reviewed nursing notes and prior charts for additional history. External reports reviewed. Additional history from: family, ems.   Cardiac monitor: sinus rhythm, rate 80.  CT reviewed/interpreted by me - no fx or hem.   Labs reviewed/interpreted by me - etoh v high. K mildly low. Kcl po. L  LR bolus. Po fluids/food.   Offers etoh rehab/detox to patient both on arrival and  subsequently during visit incl at 2325 - pt refuses inpatient tx or boarding for placement. Pt requests d/c. Will provide resource guide and pt is encouraged to pursue rehab/tx options. Pt does express willingness to pursue outpatient follow up if provided resource guide.   2330, pt feels improved, has eaten/drank fluids. Signed out to Dr Jacqulyn Bath to observe in ED until clinically more sober, recheck pt, and dispo appropriately - anticipate d/c to home then as pt not willing to stay inpatient.            Final Clinical Impression(s) / ED Diagnoses Final diagnoses:  Acute alcoholic intoxication without complication (HCC)  Fall from slip, trip, or stumble, initial encounter  Contusion of head, initial encounter  Chronic alcoholism [facility])    Rx / DC Orders ED Discharge Orders     None         Cathren Laine, MD 01/12/23 2338

## 2023-01-12 NOTE — ED Triage Notes (Signed)
Patient BIB [facility] from her mother's house after alcohol intoxication and fall causing a hematoma to the right forehead, no LOC, neighbor had to help patient up. Patient A&Ox2, does not know where she is and knows 2[birth year]3 [birth year] her birthday. EMS attempted to apply c-collar but patient was noncompliant. VSS, 22 G left forearm, 500 mL NS administered en route. Patient ambulatory with assistance.

## 2023-01-12 NOTE — ED Provider Notes (Signed)
Blood pressure 119/84, pulse 86, temperature 97.6 F (36.4 C), temperature source Oral, resp. rate 20, last menstrual period 11/13/2015, SpO2 100%.  Assuming care from Dr. Denton Lank.  In short, [name] is a 57 y.o. female with a chief complaint of Fall and Alcohol Intoxication .  Refer to the original H&P for additional details.  The current plan of care is to monitor for clinical sobriety and likely d/c.  06:15 AM  Patient awake and alert. She is eating/drinking and ambulatory in the ED. Stable for discharge. She will call for a sober ride home.    Maia Plan, MD 01/13/23 [phone]

## 2023-01-12 NOTE — ED Notes (Signed)
Mother [place] [phone] would like an update asap and to know if she needs to come pick her daughter up tonight or wait until morning

## 2023-01-12 NOTE — Discharge Instructions (Addendum)
It was our pleasure to provide your ER care today - we hope that you feel better.  Avoid alcohol use as it is harmful to your physical health and mental well-being. See resource guide attached in terms of accessing inpatient or outpatient substance use treatment programs.  You may take librium as need, as prescribed, if withdrawal symptoms. Make sure to never drink and drive.   Follow up closely with primary care doctor and behavioral health provider in the coming week. Your potassium level is mildly low - eat plenty of fruits and vegetables, and follow up with your doctor in one week.   For mental health issues and/or crisis, you may also go directly to the [facility] - they are open 24/7 and walk-ins are welcome.    Return to ER if worse, new symptoms, fevers, chest pain, trouble breathing, or other emergency concern.

## 2023-01-12 NOTE — ED Notes (Signed)
Patient ambulated to door and requested blanket, patient advised to return to bed which she did and blanket was provided.

## 2023-01-29 ENCOUNTER — Telehealth: Payer: Self-pay | Admitting: Family Medicine

## 2023-01-29 NOTE — Telephone Encounter (Signed)
Pt is calling because she is in addiction recovery right now and is looking to get a referral to the liver specialist that is across the street from [facility] hospital so that she doesn't have to travel all the way back to [place]. She cannot accept calls where she is but she is going to call back to check the status.

## 2023-01-30 ENCOUNTER — Other Ambulatory Visit: Payer: Self-pay

## 2023-01-30 NOTE — Telephone Encounter (Signed)
Sent to [employer] Please advise

## 2023-01-30 NOTE — Telephone Encounter (Signed)
[name] has fixed this location

## 2023-01-30 NOTE — Telephone Encounter (Signed)
12/17/2022 this referral was placed

## 2023-01-30 NOTE — Telephone Encounter (Signed)
A new referral needs to be placed, for the new location that pt would like to go to.   Thanks   Alcario Drought

## 2023-01-31 ENCOUNTER — Telehealth: Payer: Self-pay | Admitting: Family Medicine

## 2023-01-31 NOTE — Telephone Encounter (Signed)
[name] is currently in rehab and will be discharged Feb 14, 2023 and will be available Feb 18, 2023 Monday   You can't talk to her directly but she has given permission to leave a message to [place] at [phone] [facility]   Hopefully this helps.

## 2023-01-31 NOTE — Telephone Encounter (Signed)
Referral has been faxed to    [facility] Address: [address], [place], [place] [address]   Phone: [phone]  I have LM for Baxter Hire to make pt aware of this information.

## 2023-01-31 NOTE — Telephone Encounter (Signed)
Noted! Thank you

## 2023-02-01 ENCOUNTER — Other Ambulatory Visit: Payer: Self-pay | Admitting: Family Medicine

## 2023-02-01 DIAGNOSIS — Z1211 Encounter for screening for malignant neoplasm of colon: Secondary | ICD-10-CM

## 2023-02-01 DIAGNOSIS — Z1212 Encounter for screening for malignant neoplasm of rectum: Secondary | ICD-10-CM

## 2023-02-13 ENCOUNTER — Telehealth: Payer: Self-pay | Admitting: Family Medicine

## 2023-02-13 DIAGNOSIS — F1021 Alcohol dependence, in remission: Secondary | ICD-10-CM

## 2023-02-13 NOTE — Telephone Encounter (Addendum)
Pt called to say she has just completed her stay at the facility that was helping her with alcoholism.  Pt will be discharged tomorrow (02/14/23) Pt states she had her last shot of Vivitrol on 01/19/23. Pt is asking if she can come in on Monday, 02/18/23 for her next shot?  Please advise.

## 2023-02-14 NOTE — Telephone Encounter (Signed)
Spoke with pt, states she will check with her insurance. Was driving but will get information about [facility] when she comes by today.

## 2023-02-15 NOTE — Telephone Encounter (Signed)
Pt called back to say she contacted [facility]. They told her she needs a referral from [facility]. Please send referral to [facility] // [facility] via fax

## 2023-02-18 NOTE — Addendum Note (Signed)
Addended by: Elwin Mocha on: 02/18/2023 03:42 PM   Modules accepted: Orders

## 2023-03-13 ENCOUNTER — Encounter: Payer: Self-pay | Admitting: Psychiatry

## 2023-04-02 ENCOUNTER — Ambulatory Visit: Payer: MEDICAID | Admitting: Family Medicine

## 2023-04-03 ENCOUNTER — Ambulatory Visit: Payer: MEDICAID | Admitting: Adult Health

## 2023-05-09 ENCOUNTER — Encounter: Payer: Self-pay | Admitting: Emergency Medicine

## 2023-05-09 ENCOUNTER — Other Ambulatory Visit: Payer: Self-pay

## 2023-05-09 ENCOUNTER — Emergency Department
Admission: EM | Admit: 2023-05-09 | Discharge: 2023-05-12 | Disposition: A | Discharge status: Home / Self Care | Payer: MEDICAID | Attending: Emergency Medicine | Admitting: Emergency Medicine

## 2023-05-09 DIAGNOSIS — I10 Essential (primary) hypertension: Secondary | ICD-10-CM | POA: Diagnosis not present

## 2023-05-09 DIAGNOSIS — Y908 Blood alcohol level of 240 mg/100 ml or more: Secondary | ICD-10-CM | POA: Diagnosis not present

## 2023-05-09 DIAGNOSIS — F10920 Alcohol use, unspecified with intoxication, uncomplicated: Secondary | ICD-10-CM | POA: Insufficient documentation

## 2023-05-09 DIAGNOSIS — F1010 Alcohol abuse, uncomplicated: Secondary | ICD-10-CM

## 2023-05-09 DIAGNOSIS — L0100 Impetigo, unspecified: Secondary | ICD-10-CM

## 2023-05-09 LAB — URINALYSIS, ROUTINE W REFLEX MICROSCOPIC
Bilirubin Urine: NEGATIVE
Glucose, UA: NEGATIVE mg/dL
Hgb urine dipstick: NEGATIVE
Ketones, ur: NEGATIVE mg/dL
Leukocytes,Ua: NEGATIVE
Nitrite: NEGATIVE
Protein, ur: NEGATIVE mg/dL
Specific Gravity, Urine: 1.002 — ABNORMAL LOW (ref 1.005–1.030)
pH: 7 (ref 5.0–8.0)

## 2023-05-09 LAB — BASIC METABOLIC PANEL
Anion gap: 17 — ABNORMAL HIGH (ref 5–15)
BUN: 8 mg/dL (ref 6–20)
CO2: 20 mmol/L — ABNORMAL LOW (ref 22–32)
Calcium: 8.4 mg/dL — ABNORMAL LOW (ref 8.9–10.3)
Chloride: 100 mmol/L (ref 98–111)
Creatinine, Ser: 0.69 mg/dL (ref 0.44–1.00)
GFR, Estimated: >60 mL/min (ref >60)
Glucose, Bld: 84 mg/dL (ref 70–99)
Potassium: 2.8 mmol/L — ABNORMAL LOW (ref 3.5–5.1)
Sodium: 137 mmol/L (ref 135–145)

## 2023-05-09 LAB — CBC
HCT: 39.5 % (ref 36.0–46.0)
Hemoglobin: 13.6 g/dL (ref 12.0–15.0)
MCH: 33.7 pg (ref 26.0–34.0)
MCHC: 34.4 g/dL (ref 30.0–36.0)
MCV: 98 fL (ref 80.0–100.0)
Platelets: 148 10*3/uL — ABNORMAL LOW (ref 150–400)
RBC: 4.03 MIL/uL (ref 3.87–5.11)
RDW: 16.5 % — ABNORMAL HIGH (ref 11.5–15.5)
WBC: 5.2 10*3/uL (ref 4.0–10.5)
nRBC: 0 % (ref 0.0–0.2)

## 2023-05-09 LAB — URINE DRUG SCREEN, QUALITATIVE (ARMC ONLY)
Amphetamines, Ur Screen: NOT DETECTED
Barbiturates, Ur Screen: NOT DETECTED
Benzodiazepine, Ur Scrn: NOT DETECTED
Cannabinoid 50 Ng, Ur ~~LOC~~: NOT DETECTED
Cocaine Metabolite,Ur ~~LOC~~: NOT DETECTED
MDMA (Ecstasy)Ur Screen: NOT DETECTED
Methadone Scn, Ur: NOT DETECTED
Opiate, Ur Screen: NOT DETECTED
Phencyclidine (PCP) Ur S: NOT DETECTED
Tricyclic, Ur Screen: NOT DETECTED

## 2023-05-09 LAB — SALICYLATE LEVEL: Salicylate Lvl: <7 mg/dL — ABNORMAL LOW (ref 7.0–30.0)

## 2023-05-09 LAB — ETHANOL: Alcohol, Ethyl (B): 535 mg/dL (ref <10)

## 2023-05-09 LAB — CBG MONITORING, ED: Glucose-Capillary: 76 mg/dL (ref 70–99)

## 2023-05-09 LAB — ACETAMINOPHEN LEVEL: Acetaminophen (Tylenol), Serum: <10 ug/mL — ABNORMAL LOW (ref 10–30)

## 2023-05-09 MED ORDER — THIAMINE HCL 100 MG/ML IJ SOLN: 100.0000 mg | Freq: Every day | INTRAMUSCULAR | Status: DC

## 2023-05-09 MED ORDER — ADULT MULTIVITAMIN W/MINERALS CH
1.0000 | ORAL_TABLET | Freq: Every day | ORAL | Status: DC
  Administered 2023-05-09 – 2023-05-12 (×4): 1 via ORAL
  Filled 2023-05-09 (×4): qty 1

## 2023-05-09 MED ORDER — LORAZEPAM 2 MG/ML IJ SOLN
1.0000 mg | INTRAMUSCULAR | Status: DC | PRN
Start: 2023-05-09 — End: 2023-05-12

## 2023-05-09 MED ORDER — POTASSIUM CHLORIDE CRYS ER 20 MEQ PO TBCR
40.0000 meq | EXTENDED_RELEASE_TABLET | Freq: Once | ORAL | Status: AC
  Administered 2023-05-09: 40 meq via ORAL
  Filled 2023-05-09: qty 2

## 2023-05-09 MED ORDER — THIAMINE MONONITRATE 100 MG PO TABS
100.0000 mg | ORAL_TABLET | Freq: Every day | ORAL | Status: DC
  Administered 2023-05-09 – 2023-05-12 (×4): 100 mg via ORAL
  Filled 2023-05-09 (×4): qty 1

## 2023-05-09 MED ORDER — SODIUM CHLORIDE 0.9 % IV BOLUS
1000.0000 mL | Freq: Once | INTRAVENOUS | Status: AC
  Administered 2023-05-09: 1000 mL via INTRAVENOUS

## 2023-05-09 MED ORDER — LORAZEPAM 1 MG PO TABS
1.0000 mg | ORAL_TABLET | ORAL | Status: DC | PRN
  Administered 2023-05-09 – 2023-05-10 (×2): 1 mg via ORAL
  Administered 2023-05-10: 2 mg via ORAL
  Filled 2023-05-09: qty 1
  Filled 2023-05-09: qty 2
  Filled 2023-05-09: qty 1

## 2023-05-09 MED ORDER — FOLIC ACID 1 MG PO TABS
1.0000 mg | ORAL_TABLET | Freq: Every day | ORAL | Status: DC
  Administered 2023-05-09 – 2023-05-12 (×4): 1 mg via ORAL
  Filled 2023-05-09 (×4): qty 1

## 2023-05-09 NOTE — ED Notes (Signed)
 This RN walked by patient room to check on patient and found patient to have d/c her IV and had blood on her. This RN immediately entered the room and put pressure on the IV site to control the bleeding. This RN cleaned patient up and replaced patient's linens. This RN called charge nurse to have a tech sit 1:1 with patient.

## 2023-05-09 NOTE — ED Provider Notes (Addendum)
 Care assumed of patient from outgoing provider.  See their note for initial history, exam and plan.  Clinical Course as of 05/09/23 2311  Thu May 09, 2023  8144 Minimally elevate the anion gap likely due to some degree of high ethanol level. [MQ]  1855 Critically high ethanol level, patient alert oriented speaking but was slurred speech and ataxia.  Remarkably without severe somnolence or obtundation, suspect severe degree of chronic alcohol  abuse and patient tolerant [MQ]  2310 Plan for rehab today - presented intoxicated. Resting now. TTS to eval. Repeat ethanol level in the AM. Plan to go to rehab once medically cleared.  [SM]    Clinical Course User Index [MQ] Dicky Anes, MD [SM] Suzanne Kirsch, MD   Complaining of a rash to her upper lip.  Concern for impetigo.  Ordered mupirocin  ointment.  Patient with mild tachycardia this morning.  Alcohol  level significantly improved to 150.  Given a first dose of p.o. Librium .   Suzanne Kirsch, MD 05/09/23 7688    Suzanne Kirsch, MD 05/10/23 9540    Suzanne Kirsch, MD 05/10/23 [phone]

## 2023-05-09 NOTE — BH Assessment (Signed)
 Comprehensive Clinical Assessment (CCA) Screening, Triage and Referral Note  05/09/2023 [name] [id number] [name] is a 58 year old, English speaking, Caucasian female with a hx of alcohol  use disorder, severe.  Pt presented to [facility] ED voluntarily. Per triage note: Patient to ED via POV for possible overdose. Family reports they were in the process of taking her to detox when patient went into the restroom and came out acting different and not as responsive. Family states possible drug use- pt denies. Patient states she normally drinks 1/2 pint of vodka a day but drank more today.  On assessment pt. requested detox/substance abuse treatment. Pt admitted to using an unknown amount of alcohol  prior to her arrival on 05/09/23. Pt denied using any other substances. Pt identified having stressors such as her financial situation, breaking up with her boyfriend on Monday, and the passing away of her husband 5 years ago. Pt reported that she'd drank so much today because she was under the impression that she needed to have a certain blood alcohol  level to be accepted into [facility]. Pt reported that she'd intentionally consumed an excessive amount of alcohol  to ensure that she'd be accepted at the facility. Pt denies current SI/HI, AV/H, or NSSIB. Pt denies any upcoming court dates. Pt is oriented x4. Pt expressed a desire to change, stop drinking alcohol  and live a sober lifestyle.  Pt was drowsy with slurred speech. Pt's thought processes were relevant and coherent. Pt reported that she drinks  of a 5th of vodka. Pt reported having tremors when she does not drink. Pt's BAL was 535 upon arrival. Pt's UDS was unremarkable.   Flowsheet Row ED from 05/09/2023 in [facility] Emergency Department at [facility] ED from 01/12/2023 in [facility] Emergency Department at [facility] ED from 08/23/2022 in [facility] Emergency Department at [facility]  C-SSRS RISK CATEGORY No Risk No Risk No Risk        Chief Complaint:  Chief Complaint  Patient presents with   Drug Overdose   Visit Diagnosis: Alcohol  use disorder, severe dependency  Patient Reported Information How did you hear about us ? No data recorded What Is the Reason for Your Visit/Call Today? No data recorded How Long Has This Been Causing You Problems? No data recorded What Do You Feel Would Help You the Most Today? No data recorded  Have You Recently Had Any Thoughts About Hurting Yourself? No data recorded Are You Planning to Commit Suicide/Harm Yourself At This time? No data recorded  Have you Recently Had Thoughts About Hurting Someone Sherral? No data recorded Are You Planning to Harm Someone at This Time? No data recorded Explanation: No data recorded  Have You Used Any Alcohol  or Drugs in the Past 24 Hours? No data recorded How Long Ago Did You Use Drugs or Alcohol ? No data recorded What Did You Use and How Much? No data recorded  Do You Currently Have a Therapist/Psychiatrist? No data recorded Name of Therapist/Psychiatrist: No data recorded  Have You Been Recently Discharged From Any Office Practice or Programs? No data recorded Explanation of Discharge From Practice/Program: No data recorded   CCA Screening Triage Referral Assessment Type of Contact: No data recorded Telemedicine Service Delivery:   Is this Initial or Reassessment?   Date Telepsych consult ordered in [facility]:    Time Telepsych consult ordered in [facility]:    Location of Assessment: No data recorded Provider Location: No data recorded   Collateral Involvement: No data recorded  Does Patient  Have a Automotive Engineer Guardian? No data recorded Name and Contact of Legal Guardian: No data recorded If Minor and Not Living with Parent(s), Who has Custody? No data recorded Is CPS involved or ever been involved? No data recorded Is APS involved or ever been involved? No data recorded  Patient Determined To Be At Risk for Harm To Self or Others  Based on Review of Patient Reported Information or Presenting Complaint? No data recorded Method: No data recorded Availability of Means: No data recorded Intent: No data recorded Notification Required: No data recorded Additional Information for Danger to Others Potential: No data recorded Additional Comments for Danger to Others Potential: No data recorded Are There Guns or Other Weapons in Your Home? No data recorded Types of Guns/Weapons: No data recorded Are These Weapons Safely Secured?                            No data recorded Who Could Verify You Are Able To Have These Secured: No data recorded Do You Have any Outstanding Charges, Pending Court Dates, Parole/Probation? No data recorded Contacted To Inform of Risk of Harm To Self or Others: No data recorded  Does Patient Present under Involuntary Commitment? No data recorded   [place] of Residence: No data recorded  Patient Currently Receiving the Following Services: No data recorded  Determination of Need: No data recorded  Options For Referral: Substance abuse treatment/detox  Gabriana Wilmott R Alek Borges, LCAS

## 2023-05-09 NOTE — BH Assessment (Signed)
 Writer discussed with the patient about the option for detox/treatment with [facility].  Patient was in the agreement with the plan. Writer spoke with ([phone]) who requested updated labs and a note that indicates pt is medically cleared. Susie advised TTS to follow up with Lamar, RN in the morning for pt's further review.

## 2023-05-09 NOTE — ED Notes (Signed)
 Date and time results received: 05/09/23 1853 Test: Alcohol, Ethyl  Critical Value: 535  Name of Provider Notified: MD Fanny Bien

## 2023-05-09 NOTE — ED Triage Notes (Signed)
 Patient to ED via POV for possible overdose. Family reports they were in the process of taking her to detox when patient went into the restroom and came out acting different and not as responsive. Family states possible drug use- pt denies. Patient states she normally drinks 1/2 pint of vodka a day but drank more today,.

## 2023-05-09 NOTE — ED Provider Notes (Signed)
 [facility] Provider Note    Event Date/Time   First MD Initiated Contact with Patient 05/09/23 1746     (approximate)   History   Drug Overdose   HPI  [name] is a 58 y.o. female with a history of alcohol  dependence hypertension  Family reports that for 15 years she has had severe alcoholism.  She was to attend rehab today, family went to pick her up at approximately 5 PM and she seemed quite sober.  She then went into the bathroom and not long after that she was stumbling speech was slurred less responsive.  They brought her to the ER for evaluation they are very suspect that she either took something or drink alcohol   The patient himself is alert oriented and at this point reports that she does not want hurt herself she was not suicidal.  She reports she went into the bathroom and drank alcohol  of a store-bought nature, not any sort of hand sanitizer or anything of that nature.  She had a large drink of alcohol  in anticipation that she was going to need to go to rehab.  She is not any pain or discomfort.  She freely admits that she wishes to still go to detox, and has a severe alcohol  problem for about 15 years.  She adamantly denies being suicidal.     Physical Exam   Triage Vital Signs: ED Triage Vitals  Encounter Vitals Group     BP 05/09/23 1734 115/77     Systolic BP Percentile --      Diastolic BP Percentile --      Pulse Rate 05/09/23 1734 84     Resp 05/09/23 1734 18     Temp 05/09/23 1739 98 F (36.7 C)     Temp Source 05/09/23 1739 Oral     SpO2 05/09/23 1734 99 %     Weight --      Height --      Head Circumference --      Peak Flow --      Pain Score 05/09/23 1734 0     Pain Loc --      Pain Education --      Exclude from Growth Chart --     Most recent vital signs: Vitals:   05/09/23 2238 05/09/23 2300  BP:    Pulse:  89  Resp:  10  Temp: 97.8 F (36.6 C)   SpO2:  100%     General: Awake, no distress.   Speech is somewhat slurred she is fully alert pleasant and in no distress.  Her family including sister at the bedside are very pleasant. CV:  Good peripheral perfusion.  Normal tones and rate Resp:  Normal effort.  Clear bilateral Abd:  No distention.  Soft nontender nondistended Other:  Moves extremities well.  No focal deficits.  Speech is slightly slurred.  Slight upper extremity ataxic movements mild   ED Results / Procedures / Treatments   Labs (all labs ordered are listed, but only abnormal results are displayed) Labs Reviewed  ETHANOL - Abnormal; Notable for the following components:      Result Value   Alcohol , Ethyl (B) 535 (*)    All other components within normal limits  SALICYLATE LEVEL - Abnormal; Notable for the following components:   Salicylate Lvl <7.0 (*)    All other components within normal limits  ACETAMINOPHEN  LEVEL - Abnormal; Notable for the following components:   Acetaminophen  (Tylenol ),  Serum <10 (*)    All other components within normal limits  CBC - Abnormal; Notable for the following components:   RDW 16.5 (*)    Platelets 148 (*)    All other components within normal limits  BASIC METABOLIC PANEL - Abnormal; Notable for the following components:   Potassium 2.8 (*)    CO2 20 (*)    Calcium  8.4 (*)    Anion gap 17 (*)    All other components within normal limits  URINALYSIS, ROUTINE W REFLEX MICROSCOPIC - Abnormal; Notable for the following components:   Color, Urine STRAW (*)    APPearance CLEAR (*)    Specific Gravity, Urine 1.002 (*)    All other components within normal limits  URINE DRUG SCREEN, QUALITATIVE ([facility] ONLY)  ETHANOL  BASIC METABOLIC PANEL  CBG MONITORING, ED     EKG     RADIOLOGY  No noted indication for imaging at this time.  Suspect the patient's presentation and clinical history provided in laboratory testing support most likely cause for her symptoms as being secondary to ethanol use.  No evidence of trauma or  injury no report of trauma or injury.   PROCEDURES:  Critical Care performed: No  Procedures   MEDICATIONS ORDERED IN ED: Medications  LORazepam  (ATIVAN ) tablet 1-4 mg (1 mg Oral Given 05/09/23 1826)    Or  LORazepam  (ATIVAN ) injection 1-4 mg ( Intravenous See Alternative 05/09/23 1826)  thiamine  (VITAMIN B1) tablet 100 mg (100 mg Oral Given 05/09/23 1826)    Or  thiamine  (VITAMIN B1) injection 100 mg ( Intravenous See Alternative 05/09/23 1826)  folic acid  (FOLVITE ) tablet 1 mg (1 mg Oral Given 05/09/23 1826)  multivitamin with minerals tablet 1 tablet (1 tablet Oral Given 05/09/23 1826)  potassium chloride  SA (KLOR-CON  M) CR tablet 40 mEq (40 mEq Oral Given 05/09/23 2018)  sodium chloride  0.9 % bolus 1,000 mL (1,000 mLs Intravenous New Bag/Given 05/09/23 2327)     IMPRESSION / MDM / ASSESSMENT AND PLAN / ED COURSE  I reviewed the triage vital signs and the nursing notes.                              Differential diagnosis includes, but is not limited to, possible alcohol  intoxication, metabolic abnormality, substance abuse, overdose, etc.  After obtaining clinical history examining the patient and discussing with her and as well as her family it seems quite clear that the patient consumed a large amount of alcohol  just prior to going to rehab.  She did not get to rehab but instead was confused slurred speech depressed mental status.  She is now improved but her alcohol  level is approximately 540.  She will need to be observed and advised also by family that [facility] cannot accept until alcohol  would be approximately 200.  She is awake alert motivated to go to detox at this point, and I placed consult to [facility].  Will continue to observe her closely and anticipate disposition likely with coordination through [facility]TS possibly to [facility].  Placed on withdrawal protocol.  Currently no evidence of acute withdrawal.  She is not confused she is not perseverating, she is not hallucinating.  Denies desire to harm  herself.  Adding she willingly admits to use of alcohol  in the bathroom just prior to leaving with family  [name] presentation is most consistent with acute complicated illness / injury requiring diagnostic workup.   The patient is on  the cardiac monitor to evaluate for evidence of arrhythmia and/or significant heart rate changes.   Clinical Course as of 05/09/23 2328  Thu May 09, 2023  8144 Minimally elevate the anion gap likely due to some degree of high ethanol level. [MQ]  1855 Critically high ethanol level, patient alert oriented speaking but was slurred speech and ataxia.  Remarkably without severe somnolence or obtundation, suspect severe degree of chronic alcohol  abuse and patient tolerant [MQ]  2310 Plan for rehab today - presented intoxicated. Resting now. TTS to eval. Repeat ethanol level in the AM. Plan to go to rehab once medically cleared.  [SM]    Clinical Course User Index [MQ] Dicky Anes, MD [SM] Suzanne Kirsch, MD   ----------------------------------------- 11:27 PM on 05/09/2023 -----------------------------------------  Resting comfortably stable hemodynamics very mild hypotension suspect likely in the setting are related to heavy ethanol use.  She is arousable to tactile and verbal stimuli.  Currently resting comfortably without distress.  Continue to observe, will give fluid bolus.  Ongoing care assigned to Dr. Suzanne with the plan for repeat metabolic panel and ethanol level in the morning around 6 AM and ongoing continued observation in coordination with [facility]TS team for an anticipated disposition outpatient detox  FINAL CLINICAL IMPRESSION(S) / ED DIAGNOSES   Final diagnoses:  Alcohol  abuse  Alcoholic intoxication without complication (HCC)     Rx / DC Orders   ED Discharge Orders     None        Note:  This document was prepared using Dragon voice recognition software and may include unintentional dictation errors.   Dicky Anes, MD 05/09/23  2328

## 2023-05-10 LAB — BASIC METABOLIC PANEL
Anion gap: 13 (ref 5–15)
BUN: 11 mg/dL (ref 6–20)
CO2: 23 mmol/L (ref 22–32)
Calcium: 8.6 mg/dL — ABNORMAL LOW (ref 8.9–10.3)
Chloride: 109 mmol/L (ref 98–111)
Creatinine, Ser: 0.59 mg/dL (ref 0.44–1.00)
GFR, Estimated: >60 mL/min (ref >60)
Glucose, Bld: 88 mg/dL (ref 70–99)
Potassium: 3.7 mmol/L (ref 3.5–5.1)
Sodium: 140 mmol/L (ref 135–145)

## 2023-05-10 LAB — CBG MONITORING, ED
Glucose-Capillary: 75 mg/dL (ref 70–99)
Glucose-Capillary: 107 mg/dL — ABNORMAL HIGH (ref 70–99)

## 2023-05-10 LAB — ETHANOL: Alcohol, Ethyl (B): 157 mg/dL — ABNORMAL HIGH (ref <10)

## 2023-05-10 MED ORDER — NALTREXONE HCL 50 MG PO TABS
50.0000 mg | ORAL_TABLET | Freq: Every day | ORAL | Status: DC
  Administered 2023-05-10 – 2023-05-12 (×3): 50 mg via ORAL
  Filled 2023-05-10 (×3): qty 1

## 2023-05-10 MED ORDER — CHLORDIAZEPOXIDE HCL 25 MG PO CAPS
50.0000 mg | ORAL_CAPSULE | Freq: Once | ORAL | Status: AC
  Administered 2023-05-10: 50 mg via ORAL
  Filled 2023-05-10: qty 2

## 2023-05-10 MED ORDER — MUPIROCIN 2 % EX OINT: 1.0000 | TOPICAL_OINTMENT | Freq: Three times a day (TID) | CUTANEOUS | 0 refills | Status: AC

## 2023-05-10 MED ORDER — SERTRALINE HCL 50 MG PO TABS
100.0000 mg | ORAL_TABLET | Freq: Every day | ORAL | Status: DC
  Administered 2023-05-10 – 2023-05-12 (×3): 100 mg via ORAL
  Filled 2023-05-10 (×3): qty 2

## 2023-05-10 MED ORDER — TRAZODONE HCL 100 MG PO TABS
300.0000 mg | ORAL_TABLET | Freq: Every day | ORAL | Status: DC
  Administered 2023-05-10 – 2023-05-11 (×2): 300 mg via ORAL
  Filled 2023-05-10 (×2): qty 3

## 2023-05-10 MED ORDER — IBUPROFEN 400 MG PO TABS
400.0000 mg | ORAL_TABLET | Freq: Once | ORAL | Status: AC
  Administered 2023-05-10: 400 mg via ORAL
  Filled 2023-05-10: qty 1

## 2023-05-10 MED ORDER — BUSPIRONE HCL 5 MG PO TABS
10.0000 mg | ORAL_TABLET | Freq: Two times a day (BID) | ORAL | Status: DC
  Administered 2023-05-10 – 2023-05-12 (×4): 10 mg via ORAL
  Filled 2023-05-10 (×4): qty 2

## 2023-05-10 MED ORDER — MUPIROCIN CALCIUM 2 % EX CREA
TOPICAL_CREAM | Freq: Three times a day (TID) | CUTANEOUS | Status: DC
Start: 2023-05-10 — End: 2023-05-12
  Administered 2023-05-10: 1 via TOPICAL
  Filled 2023-05-10 (×2): qty 15

## 2023-05-10 NOTE — ED Notes (Signed)
Patient received dinner tray at this time.  

## 2023-05-10 NOTE — Discharge Instructions (Addendum)

## 2023-05-10 NOTE — ED Notes (Signed)
 A pair of earrings were found in room on floor, placed in bag and label chart placed on them. Placed in her belongings bag located on A side nurses station

## 2023-05-10 NOTE — ED Notes (Signed)
Hospital meal provided.

## 2023-05-10 NOTE — TOC CM/SW Note (Signed)
TOC consulted for SA resources. Resources added to AVS.  Alfonso Ramus, LCSW Transitions of Care Department [phone]

## 2023-05-10 NOTE — ED Notes (Signed)
 Hospital meal provided.  25% consumed, pt tolerated w/o complaints.  Waste discarded appropriately.

## 2023-05-10 NOTE — BH Assessment (Signed)
 [facility]TS contacted [facility] ([phone]), and they don't have any available beds due to the upcoming weather.    Referral information for Detox and SA Treatment fax to:  [facility] [phone])  [facility] [phone]---[phone]),  [facility] [phone]) [phone] crisis center/detox facility), no available beds.  [facility] [phone]--- [phone]--- [phone]--- [phone])  [facility] [phone]),   [facility] [phone])  [facility] [phone])

## 2023-05-10 NOTE — ED Notes (Signed)
 Patient dressed in burgundy scrubs.   Belongings include: -White jacket -White long sleeve shirt  -Black jeans  -Black boots  -Beige bra

## 2023-05-10 NOTE — ED Notes (Signed)
VOL/Detox °

## 2023-05-11 MED ORDER — IBUPROFEN 400 MG PO TABS
400.0000 mg | ORAL_TABLET | Freq: Once | ORAL | Status: AC
  Administered 2023-05-11: 400 mg via ORAL
  Filled 2023-05-11: qty 1

## 2023-05-11 MED ORDER — BENZOCAINE 10 % MT GEL
Freq: Once | OROMUCOSAL | Status: AC
  Filled 2023-05-11: qty 9

## 2023-05-11 NOTE — ED Notes (Signed)
 Patient is vol pending Detox

## 2023-05-11 NOTE — ED Notes (Signed)
 Pt continues to wait on inpatient detax, denies any s/s of etoh w/d through out this shift.  Cont to monitor as ordered, or until inpatient detox has been identified

## 2023-05-11 NOTE — ED Notes (Addendum)
 Pt requested and received Whole milk by this tech. No other request at this time.

## 2023-05-11 NOTE — ED Notes (Signed)
Pt given carton of milk

## 2023-05-11 NOTE — ED Notes (Signed)
 Vol/detox

## 2023-05-11 NOTE — ED Provider Notes (Signed)
 Emergency Medicine Observation Re-evaluation Note  [name] is a 58 y.o. female, seen on rounds today.  Pt initially presented to the ED for complaints of Drug Overdose Currently, the patient is resting.  Physical Exam  BP (!) 149/81 (BP Location: Left Arm)   Pulse 93   Temp 99.7 F (37.6 C) (Oral)   Resp 18   LMP 11/13/2015   SpO2 97%  Physical Exam Gen:  No acute distress Resp:  Breathing easily and comfortably, no accessory muscle usage Neuro:  Moving all four extremities, no gross focal neuro deficits Psych:  Resting currently, calm when awake  ED Course / MDM  EKG:EKG Interpretation Date/Time:  Thursday May 09 2023 18:31:54 EST Ventricular Rate:  81 PR Interval:  153 QRS Duration:  104 QT Interval:  419 QTC Calculation: 487 R Axis:   24  Text Interpretation: Sinus rhythm Low voltage, precordial leads Borderline prolonged QT interval Confirmed by UNCONFIRMED, DOCTOR ([id number]), editor Alex Slough [phone]) on 05/10/2023 9:02:11 AM  I have reviewed the labs performed to date as well as medications administered while in observation.  Recent changes in the last 24 hours include no significant changes.  Plan  Current plan is for detox placement.  Typically this is not something done within the [facility] health system, but apparently it is being pursued for this patient so I will not change the plan at this time.    Gordan Huxley, MD 05/11/23 [phone]

## 2023-05-12 MED ORDER — PHENOBARBITAL SODIUM 65 MG/ML IJ SOLN
130.0000 mg | Freq: Once | INTRAMUSCULAR | Status: AC
  Administered 2023-05-12: 130 mg via INTRAMUSCULAR
  Filled 2023-05-12: qty 2

## 2023-05-12 NOTE — ED Notes (Signed)
 Vol/ Detox

## 2023-05-12 NOTE — ED Provider Notes (Signed)
 Patient has been boarding here for the past 3 days voluntarily seeking rehabilitation for ethanol.  She is on CIWA protocol with low scores and intermittent Ativan  dosing.  I reassessed the patient and she is pleasant, conversational and understands the plan of care.  We discussed the possibility of outpatient rehabilitation placement.  She reports having a safe place to go and is comfortable leaving to continued pursuit of rehabilitation locations.  She has no suicidality or emergent needs.  We discussed a single dose of intramuscular phenobarbital  to help prevent worsening withdrawals, seizures, etc.  We discussed her reaching out to her boyfriend to come pick her up and keep her safe at home until she can find rehabilitation location.  We discussed close ED return precautions.  She is appreciative and in agreement with plan of care.  She reports frustration of being stuck in the hallway for the past 3 days.   Claudene Rover, MD 05/12/23 [phone]

## 2023-05-12 NOTE — ED Provider Notes (Signed)
 Emergency Medicine Observation Re-evaluation Note  [name] is a 58 y.o. female, seen on rounds today.  Pt initially presented to the ED for complaints of Drug Overdose  Currently, the patient is is no acute distress. Denies any concerns at this time.  Physical Exam  Blood pressure (!) 134/94, pulse 74, temperature 98.6 F (37 C), temperature source Oral, resp. rate 14, height 5' 2 (1.575 m), weight 65.3 kg, last menstrual period 11/13/2015, SpO2 95%.  Physical Exam: General: No apparent distress Pulm: Normal WOB Neuro: Moving all extremities Psych: Resting comfortably     ED Course / MDM   Clinical Course as of 05/12/23 0323  Thu May 09, 2023  1855 Minimally elevate the anion gap likely due to some degree of high ethanol level. [MQ]  1855 Critically high ethanol level, patient alert oriented speaking but was slurred speech and ataxia.  Remarkably without severe somnolence or obtundation, suspect severe degree of chronic alcohol  abuse and patient tolerant [MQ]  2310 Plan for rehab today - presented intoxicated. Resting now. TTS to eval. Repeat ethanol level in the AM. Plan to go to rehab once medically cleared.  [SM]    Clinical Course User Index [MQ] Dicky Anes, MD [SM] Suzanne Kirsch, MD    I have reviewed the labs performed to date as well as medications administered while in observation.  Recent changes in the last 24 hours include: No acute events overnight.  Plan   Current plan: Patient awaiting psychiatric disposition. Patient is not under full IVC at this time.    Falicia Lizotte, Josette SAILOR, DO 05/12/23 [phone]

## 2023-05-12 NOTE — ED Notes (Signed)
 During nursing assessment [name] was A/Ox 4 .  She  stated that she is not currently have thoughts or feelings of SI/HI.  [name]  reported that she has never had or is not currently having auditory or visual hallucinations.  Pt affect is congruent with situation , eye contact is good , speech is of regular rate and volume  with appropriate verbiage noted.  Staff addressed any feelings or concerns that have been brought up.  Cont to monitor as ordered

## 2023-05-21 ENCOUNTER — Ambulatory Visit: Payer: Medicaid Other | Admitting: Behavioral Health

## 2023-06-03 ENCOUNTER — Encounter: Payer: MEDICAID | Admitting: Obstetrics & Gynecology

## 2023-06-12 ENCOUNTER — Ambulatory Visit: Payer: MEDICAID | Admitting: Physician Assistant

## 2023-06-12 NOTE — Progress Notes (Signed)
No show

## 2023-10-11 ENCOUNTER — Telehealth: Payer: Self-pay

## 2023-10-11 NOTE — Transitions of Care (Post Inpatient/ED Visit) (Unsigned)
   10/11/2023  Name: [name] MRN: [id number] DOB: 20-Jun-1965  Today's TOC FU Call Status: Today's TOC FU Call Status:: Unsuccessful Call (1st Attempt) Unsuccessful Call (1st Attempt) Date: 10/11/23  Attempted to reach the patient regarding the most recent Inpatient/ED visit.  Follow Up Plan: Additional outreach attempts will be made to reach the patient to complete the Transitions of Care (Post Inpatient/ED visit) call.   Signature Darrall Ellison, LPN [facility] Nurse Health Advisor Direct Dial [phone]

## 2023-10-14 NOTE — Transitions of Care (Post Inpatient/ED Visit) (Unsigned)
   10/14/2023  Name: [name] MRN: [id number] DOB: [birth year]  Today's TOC FU Call Status: Today's TOC FU Call Status:: Unsuccessful Call (2nd Attempt) Unsuccessful Call (1st Attempt) Date: 10/11/23 Unsuccessful Call (2nd Attempt) Date: 10/14/23  Attempted to reach the patient regarding the most recent Inpatient/ED visit.  Follow Up Plan: Additional outreach attempts will be made to reach the patient to complete the Transitions of Care (Post Inpatient/ED visit) call.   Signature Darrall Ellison, LPN [facility] Nurse Health Advisor Direct Dial [phone]

## 2023-10-15 NOTE — Transitions of Care (Post Inpatient/ED Visit) (Signed)
   10/15/2023  Name: [name] MRN: [id number] DOB: [birth year]  Today's TOC FU Call Status: Today's TOC FU Call Status:: Unsuccessful Call (3rd Attempt) Unsuccessful Call (1st Attempt) Date: 10/11/23 Unsuccessful Call (2nd Attempt) Date: 10/14/23 Unsuccessful Call (3rd Attempt) Date: 10/15/23  Attempted to reach the patient regarding the most recent Inpatient/ED visit.  Follow Up Plan: No further outreach attempts will be made at this time. We have been unable to contact the patient.  Signature Darrall Ellison, LPN [facility] Nurse Health Advisor Direct Dial [phone]

## 2024-05-02 ENCOUNTER — Encounter (HOSPITAL_COMMUNITY): Payer: Self-pay | Admitting: Emergency Medicine

## 2024-05-02 ENCOUNTER — Other Ambulatory Visit: Payer: Self-pay

## 2024-05-02 ENCOUNTER — Emergency Department (HOSPITAL_COMMUNITY)
Admission: EM | Admit: 2024-05-02 | Discharge: 2024-05-03 | Disposition: A | Payer: MEDICAID | Attending: Emergency Medicine | Admitting: Emergency Medicine

## 2024-05-02 DIAGNOSIS — Z23 Encounter for immunization: Secondary | ICD-10-CM | POA: Diagnosis not present

## 2024-05-02 DIAGNOSIS — T07XXXA Unspecified multiple injuries, initial encounter: Secondary | ICD-10-CM

## 2024-05-02 DIAGNOSIS — Z79899 Other long term (current) drug therapy: Secondary | ICD-10-CM | POA: Diagnosis not present

## 2024-05-02 DIAGNOSIS — S022XXA Fracture of nasal bones, initial encounter for closed fracture: Secondary | ICD-10-CM | POA: Diagnosis not present

## 2024-05-02 DIAGNOSIS — Y907 Blood alcohol level of 200-239 mg/100 ml: Secondary | ICD-10-CM | POA: Insufficient documentation

## 2024-05-02 DIAGNOSIS — F10129 Alcohol abuse with intoxication, unspecified: Secondary | ICD-10-CM | POA: Insufficient documentation

## 2024-05-02 DIAGNOSIS — S3011XA Contusion of abdominal wall, initial encounter: Secondary | ICD-10-CM | POA: Diagnosis not present

## 2024-05-02 DIAGNOSIS — R519 Headache, unspecified: Secondary | ICD-10-CM | POA: Diagnosis not present

## 2024-05-02 DIAGNOSIS — S2241XA Multiple fractures of ribs, right side, initial encounter for closed fracture: Secondary | ICD-10-CM | POA: Insufficient documentation

## 2024-05-02 DIAGNOSIS — F10920 Alcohol use, unspecified with intoxication, uncomplicated: Secondary | ICD-10-CM

## 2024-05-02 DIAGNOSIS — I10 Essential (primary) hypertension: Secondary | ICD-10-CM | POA: Diagnosis not present

## 2024-05-02 DIAGNOSIS — S8001XA Contusion of right knee, initial encounter: Secondary | ICD-10-CM | POA: Insufficient documentation

## 2024-05-02 DIAGNOSIS — S8002XA Contusion of left knee, initial encounter: Secondary | ICD-10-CM | POA: Diagnosis not present

## 2024-05-02 DIAGNOSIS — R1031 Right lower quadrant pain: Secondary | ICD-10-CM | POA: Insufficient documentation

## 2024-05-02 DIAGNOSIS — S0992XA Unspecified injury of nose, initial encounter: Secondary | ICD-10-CM | POA: Diagnosis present
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
## 2024-05-03 ENCOUNTER — Emergency Department (HOSPITAL_COMMUNITY): Payer: MEDICAID

## 2024-05-03 LAB — COMPREHENSIVE METABOLIC PANEL WITH GFR
ALT: 14 U/L (ref 0–44)
AST: 31 U/L (ref 15–41)
Albumin: 4.1 g/dL (ref 3.5–5.0)
Alkaline Phosphatase: 126 U/L (ref 38–126)
Anion gap: 13 (ref 5–15)
BUN: <5 mg/dL — ABNORMAL LOW (ref 6–20)
CO2: 26 mmol/L (ref 22–32)
Calcium: 9.1 mg/dL (ref 8.9–10.3)
Chloride: 105 mmol/L (ref 98–111)
Creatinine, Ser: 0.64 mg/dL (ref 0.44–1.00)
GFR, Estimated: >60 mL/min
Glucose, Bld: 100 mg/dL — ABNORMAL HIGH (ref 70–99)
Potassium: 3.4 mmol/L — ABNORMAL LOW (ref 3.5–5.1)
Sodium: 144 mmol/L (ref 135–145)
Total Bilirubin: 0.7 mg/dL (ref 0.0–1.2)
Total Protein: 7.6 g/dL (ref 6.5–8.1)

## 2024-05-03 LAB — CBC WITH DIFFERENTIAL/PLATELET
Abs Immature Granulocytes: 0.04 K/uL (ref 0.00–0.07)
Basophils Absolute: 0.1 K/uL (ref 0.0–0.1)
Basophils Relative: 1 %
Eosinophils Absolute: 0.1 K/uL (ref 0.0–0.5)
Eosinophils Relative: 2 %
HCT: 38.9 % (ref 36.0–46.0)
Hemoglobin: 13.5 g/dL (ref 12.0–15.0)
Immature Granulocytes: 0 %
Lymphocytes Relative: 31 %
Lymphs Abs: 2.9 K/uL (ref 0.7–4.0)
MCH: 31.8 pg (ref 26.0–34.0)
MCHC: 34.7 g/dL (ref 30.0–36.0)
MCV: 91.7 fL (ref 80.0–100.0)
Monocytes Absolute: 0.5 K/uL (ref 0.1–1.0)
Monocytes Relative: 5 %
Neutro Abs: 5.8 K/uL (ref 1.7–7.7)
Neutrophils Relative %: 61 %
Platelets: 344 K/uL (ref 150–400)
RBC: 4.24 MIL/uL (ref 3.87–5.11)
RDW: 13.9 % (ref 11.5–15.5)
WBC: 9.4 K/uL (ref 4.0–10.5)
nRBC: 0 % (ref 0.0–0.2)

## 2024-05-03 LAB — ETHANOL: Alcohol, Ethyl (B): 286 mg/dL — ABNORMAL HIGH

## 2024-05-03 MED ORDER — IBUPROFEN 600 MG PO TABS: 600.0000 mg | ORAL_TABLET | Freq: Four times a day (QID) | ORAL | 0 refills | Status: AC | PRN

## 2024-05-03 MED ORDER — IOHEXOL 300 MG/ML  SOLN
100.0000 mL | Freq: Once | INTRAMUSCULAR | Status: AC | PRN
  Administered 2024-05-03: 100 mL via INTRAVENOUS

## 2024-05-03 MED ORDER — TETANUS-DIPHTH-ACELL PERTUSSIS 5-2-15.5 LF-MCG/0.5 IM SUSP
0.5000 mL | Freq: Once | INTRAMUSCULAR | Status: AC
  Administered 2024-05-03: 0.5 mL via INTRAMUSCULAR
  Filled 2024-05-03: qty 0.5

## 2024-05-03 MED ORDER — MORPHINE SULFATE (PF) 4 MG/ML IV SOLN
4.0000 mg | Freq: Once | INTRAVENOUS | Status: AC
  Administered 2024-05-03: 4 mg via INTRAVENOUS
  Filled 2024-05-03: qty 1

## 2024-05-03 MED ORDER — ONDANSETRON HCL 4 MG/2ML IJ SOLN
4.0000 mg | Freq: Once | INTRAMUSCULAR | Status: AC
  Administered 2024-05-03: 4 mg via INTRAVENOUS
  Filled 2024-05-03: qty 2
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
